# Patient Record
Sex: Female | Born: 1955 | Race: White | Hispanic: No | Marital: Single | Smoking: Former smoker
Health system: Southern US, Community
[De-identification: ages and names within clinical notes are randomized; demographics above are authoritative.]

## PROBLEM LIST (undated history)

## (undated) DIAGNOSIS — F329 Major depressive disorder, single episode, unspecified: Secondary | ICD-10-CM

## (undated) DIAGNOSIS — K219 Gastro-esophageal reflux disease without esophagitis: Secondary | ICD-10-CM

## (undated) DIAGNOSIS — K635 Polyp of colon: Secondary | ICD-10-CM

## (undated) DIAGNOSIS — K602 Anal fissure, unspecified: Secondary | ICD-10-CM

## (undated) DIAGNOSIS — E569 Vitamin deficiency, unspecified: Secondary | ICD-10-CM

## (undated) DIAGNOSIS — F32A Depression, unspecified: Secondary | ICD-10-CM

## (undated) DIAGNOSIS — I10 Essential (primary) hypertension: Secondary | ICD-10-CM

## (undated) DIAGNOSIS — J45909 Unspecified asthma, uncomplicated: Secondary | ICD-10-CM

## (undated) DIAGNOSIS — F419 Anxiety disorder, unspecified: Secondary | ICD-10-CM

## (undated) DIAGNOSIS — K589 Irritable bowel syndrome without diarrhea: Secondary | ICD-10-CM

## (undated) DIAGNOSIS — T7840XA Allergy, unspecified, initial encounter: Secondary | ICD-10-CM

## (undated) DIAGNOSIS — M1990 Unspecified osteoarthritis, unspecified site: Secondary | ICD-10-CM

## (undated) HISTORY — DX: Irritable bowel syndrome, unspecified: K58.9

## (undated) HISTORY — DX: Anxiety disorder, unspecified: F41.9

## (undated) HISTORY — DX: Depression, unspecified: F32.A

## (undated) HISTORY — DX: Unspecified asthma, uncomplicated: J45.909

## (undated) HISTORY — PX: TONSILLECTOMY: SUR1361

## (undated) HISTORY — DX: Allergy, unspecified, initial encounter: T78.40XA

## (undated) HISTORY — DX: Polyp of colon: K63.5

## (undated) HISTORY — DX: Vitamin deficiency, unspecified: E56.9

## (undated) HISTORY — DX: Gastro-esophageal reflux disease without esophagitis: K21.9

## (undated) HISTORY — DX: Anal fissure, unspecified: K60.2

---
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
## 1999-03-21 ENCOUNTER — Other Ambulatory Visit: Admission: RE | Admit: 1999-03-21 | Discharge: 1999-03-21 | Payer: Self-pay | Admitting: *Deleted

## 1999-03-28 ENCOUNTER — Other Ambulatory Visit: Admission: RE | Admit: 1999-03-28 | Discharge: 1999-03-28 | Payer: Self-pay | Admitting: Obstetrics and Gynecology

## 1999-03-28 ENCOUNTER — Encounter (INDEPENDENT_AMBULATORY_CARE_PROVIDER_SITE_OTHER): Payer: Self-pay | Admitting: Specialist

## 2000-09-09 ENCOUNTER — Ambulatory Visit (HOSPITAL_COMMUNITY): Admission: RE | Admit: 2000-09-09 | Discharge: 2000-09-09 | Payer: Self-pay | Admitting: Family Medicine

## 2000-09-09 ENCOUNTER — Encounter: Payer: Self-pay | Admitting: Family Medicine

## 2001-04-06 ENCOUNTER — Other Ambulatory Visit: Admission: RE | Admit: 2001-04-06 | Discharge: 2001-04-06 | Payer: Self-pay | Admitting: Obstetrics and Gynecology

## 2001-08-18 ENCOUNTER — Encounter: Payer: Self-pay | Admitting: Obstetrics and Gynecology

## 2001-08-18 ENCOUNTER — Ambulatory Visit (HOSPITAL_COMMUNITY): Admission: RE | Admit: 2001-08-18 | Discharge: 2001-08-18 | Payer: Self-pay | Admitting: Obstetrics and Gynecology

## 2002-05-16 ENCOUNTER — Other Ambulatory Visit: Admission: RE | Admit: 2002-05-16 | Discharge: 2002-05-16 | Payer: Self-pay | Admitting: Obstetrics and Gynecology

## 2002-10-18 ENCOUNTER — Encounter: Payer: Self-pay | Admitting: Gastroenterology

## 2002-10-18 ENCOUNTER — Encounter: Payer: Self-pay | Admitting: Family Medicine

## 2002-10-18 ENCOUNTER — Encounter: Admission: RE | Admit: 2002-10-18 | Discharge: 2002-10-18 | Payer: Self-pay | Admitting: Gastroenterology

## 2002-10-18 ENCOUNTER — Ambulatory Visit (HOSPITAL_COMMUNITY): Admission: RE | Admit: 2002-10-18 | Discharge: 2002-10-18 | Payer: Self-pay | Admitting: Family Medicine

## 2002-11-16 ENCOUNTER — Ambulatory Visit (HOSPITAL_COMMUNITY): Admission: RE | Admit: 2002-11-16 | Discharge: 2002-11-16 | Payer: Self-pay | Admitting: Gastroenterology

## 2002-11-16 ENCOUNTER — Encounter (INDEPENDENT_AMBULATORY_CARE_PROVIDER_SITE_OTHER): Payer: Self-pay | Admitting: *Deleted

## 2006-02-03 ENCOUNTER — Ambulatory Visit (HOSPITAL_COMMUNITY): Admission: RE | Admit: 2006-02-03 | Discharge: 2006-02-03 | Payer: Self-pay | Admitting: Obstetrics and Gynecology

## 2009-07-17 ENCOUNTER — Ambulatory Visit (HOSPITAL_COMMUNITY): Admission: RE | Admit: 2009-07-17 | Discharge: 2009-07-17 | Payer: Self-pay | Admitting: Obstetrics and Gynecology

## 2010-06-24 ENCOUNTER — Other Ambulatory Visit (HOSPITAL_COMMUNITY): Payer: Self-pay | Admitting: Obstetrics and Gynecology

## 2010-07-18 ENCOUNTER — Other Ambulatory Visit (HOSPITAL_COMMUNITY): Payer: Self-pay | Admitting: Obstetrics and Gynecology

## 2010-07-18 DIAGNOSIS — Z1231 Encounter for screening mammogram for malignant neoplasm of breast: Secondary | ICD-10-CM

## 2010-07-23 ENCOUNTER — Ambulatory Visit (HOSPITAL_COMMUNITY)
Admission: RE | Admit: 2010-07-23 | Discharge: 2010-07-23 | Disposition: A | Discharge status: Home / Self Care | Payer: Self-pay | Source: Ambulatory Visit | Attending: Obstetrics and Gynecology | Admitting: Obstetrics and Gynecology

## 2010-07-23 DIAGNOSIS — Z1231 Encounter for screening mammogram for malignant neoplasm of breast: Secondary | ICD-10-CM | POA: Insufficient documentation

## 2010-07-26 NOTE — Op Note (Signed)
NAME:  Virginia Cabrera, Virginia Cabrera                        ACCOUNT NO.:  [id number]   MEDICAL RECORD NO.:  [id number]                   PATIENT TYPE:  AMB   LOCATION:  ENDO                                 FACILITY:  [facility]   PHYSICIAN:  Anselmo Rod, M.D.               DATE OF BIRTH:  Dec 17, 1955   DATE OF PROCEDURE:  11/16/2002  DATE OF DISCHARGE:                                 OPERATIVE REPORT   PROCEDURE:  Colonoscopy with snare polypectomy x  and cold biopsies x6.   ENDOSCOPIST:  Charna Elizabeth, M.D.   INSTRUMENT USED:  Olympus video colonoscope.   INDICATIONS FOR PROCEDURE:  Rectal bleeding with mucoid stools in a 55-year-  old white female with a family history of colonic polyps.  There is no known  family history of colon cancer.   PROCEDURE PERFORMED:  Informed consent was procured from the patient.  The  patient fasted for eight hours prior to the procedure and prepped with a  bottle of magnesium citrate and a gallon of GOLYTELY the night prior to the  procedure.   PREPROCEDURE PHYSICAL EXAMINATION:  VITAL SIGNS:  The patient had stable  vital signs.  NECK:  Supple.  CHEST:  Clear to auscultation.  HEART:  S1 and S2 regular.  ABDOMEN:  Soft with normal bowel sounds.   DESCRIPTION OF PROCEDURE:  The patient was placed in the left lateral  decubitus position, sedated with 100 mg of Demerol and 10 mg of Versed  intravenously.  Once the patient was adequately sedated and maintained on  low flow oxygen, continuous cardiac monitoring, the Olympus video  colonoscope was advanced from the rectum to the cecum where with difficulty  there was a large amount of residual stool in the colon.  Multiple washings  were tone.  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  Random biopsies were done to confirm the  diagnosis.  A small sessile polyp was snared from the mid right colon.  There was a large amount of residual stool in the cecum.   The terminal ileum  appeared normal and healthy without lesions.  Small lesions could have been  missed secondary to a relatively poor prep.   IMPRESSION:  1. 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  Biopsy results pending.  2. Sessile polyps snared from mid right colon.  3. Large amount of residual stool in the colon; small lesions could have     been missed.  4. Normal terminal ileum.    RECOMMENDATIONS:  1. Await pathology results.  2. Avoid all nonsteroidals including aspirin until further orders.  3. Outpatient followup in the next two weeks for further recommendations.  Anselmo Rod, M.D.    JNM/MEDQ  D:  11/16/2002  T:  11/17/2002  Job:  [id number]   cc:   Gabriel Earing, M.D.  [address]  [place]  [place] [address]  Fax: [phone]

## 2010-08-25 ENCOUNTER — Emergency Department (HOSPITAL_COMMUNITY): Payer: Self-pay

## 2010-08-25 ENCOUNTER — Emergency Department (HOSPITAL_COMMUNITY)
Admission: EM | Admit: 2010-08-25 | Discharge: 2010-08-25 | Disposition: A | Discharge status: Home / Self Care | Payer: Self-pay | Attending: Emergency Medicine | Admitting: Emergency Medicine

## 2010-08-25 DIAGNOSIS — M25579 Pain in unspecified ankle and joints of unspecified foot: Secondary | ICD-10-CM | POA: Insufficient documentation

## 2010-08-25 DIAGNOSIS — X500XXA Overexertion from strenuous movement or load, initial encounter: Secondary | ICD-10-CM | POA: Insufficient documentation

## 2010-08-25 DIAGNOSIS — M25473 Effusion, unspecified ankle: Secondary | ICD-10-CM | POA: Insufficient documentation

## 2010-08-25 DIAGNOSIS — M25476 Effusion, unspecified foot: Secondary | ICD-10-CM | POA: Insufficient documentation

## 2010-08-25 DIAGNOSIS — S82899A Other fracture of unspecified lower leg, initial encounter for closed fracture: Secondary | ICD-10-CM | POA: Insufficient documentation

## 2011-06-03 ENCOUNTER — Other Ambulatory Visit: Payer: Self-pay | Admitting: Obstetrics and Gynecology

## 2011-06-03 DIAGNOSIS — Z1231 Encounter for screening mammogram for malignant neoplasm of breast: Secondary | ICD-10-CM

## 2011-07-28 ENCOUNTER — Ambulatory Visit (HOSPITAL_COMMUNITY)
Admission: RE | Admit: 2011-07-28 | Discharge: 2011-07-28 | Disposition: A | Discharge status: Home / Self Care | Payer: Self-pay | Source: Ambulatory Visit | Attending: Obstetrics and Gynecology | Admitting: Obstetrics and Gynecology

## 2011-07-28 DIAGNOSIS — Z1231 Encounter for screening mammogram for malignant neoplasm of breast: Secondary | ICD-10-CM | POA: Insufficient documentation

## 2011-12-24 ENCOUNTER — Ambulatory Visit: Payer: Self-pay | Admitting: Family Medicine

## 2011-12-24 VITALS — BP 146/84 | HR 88 | Temp 98.2°F | Resp 16 | Ht 62.0 in | Wt 219.0 lb

## 2011-12-24 DIAGNOSIS — Q383 Other congenital malformations of tongue: Secondary | ICD-10-CM

## 2011-12-24 NOTE — Patient Instructions (Signed)
.   Nice to meet you. I think this time growth is benign but I would like you to see a your nose and throat doctor. He will be getting this appointment set up with you in the near future. If you any questions please do not hesitate to call.

## 2011-12-24 NOTE — Progress Notes (Signed)
  Subjective:    Patient ID: Virginia Cabrera, female    DOB: December 03, 1955, 56 y.o.   MRN: [id number]  HPI 56 year old female coming in with a tongue mass. Patient states that she bit her tongue approximately 3 weeks ago and has had a significant growth since that time. Patient got first it was of no concern but now it continues to grow. Patient denies any pain denies any bleeding she denies also any fever, chills, or any abnormal weight loss. Patient was a smoker but did quit 13 years ago and does not do any other tobacco products. Patient is still able to eat without any trouble but it just feels funny. Patient denies any change in color and states that that growth appears the same color as her tongue.   Review of Systems As stated above in history of present illness  Past medical history is insignificant No past surgical history on file. History   Social History  . Marital Status: Single    Spouse Name: N/A    Number of Children: N/A  . Years of Education: N/A   Occupational History  . Groomer    Social History Main Topics  . Smoking status: Former Games developer  . Smokeless tobacco: Not on file  . Alcohol Use: Not on file  . Drug Use: Not on file  . Sexually Active: Not on file   Other Topics Concern  . Not on file   Social History Narrative  . No narrative on file   No family history on file.     Objective:   Physical Exam Blood pressure 146/84, pulse 88, temperature 98.2 F (36.8 C), resp. rate 16, height 5\' 2"  (1.575 m), weight 219 lb (99.338 kg). General: No apparent distress alert and oriented x3 moderately obese female Respiratory: Clear to auscultation bilaterally Cardiovascular: Regular rate and rhythm no murmur appreciated HEENT: Patient has moist mucous membranes. On patient's tongue this appears to be hypertrophic changes of the face but on the anterior third just to the right of the midline. This does not appear to have any bleeding associated with it or any signs  of the laceration. This does have well demarcated borders. Nontender on exam unable to be remove easily. Rest oral exam is unremarkable.       Assessment & Plan:

## 2011-12-24 NOTE — Assessment & Plan Note (Signed)
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 This seems to be growing no per patient's history. Patient is a former smoker 13 years ago. At this point I do want to refer patient to ear nose and throat for evaluation and possible biopsy. Discussed red flags and when to seek medical attention with patient as well as her significant other. Patient will followup here as needed.

## 2012-02-23 ENCOUNTER — Other Ambulatory Visit: Payer: Self-pay | Admitting: Otolaryngology

## 2012-09-29 ENCOUNTER — Emergency Department (HOSPITAL_COMMUNITY): Payer: No Typology Code available for payment source

## 2012-09-29 ENCOUNTER — Emergency Department (HOSPITAL_COMMUNITY)
Admission: EM | Admit: 2012-09-29 | Discharge: 2012-09-30 | Disposition: A | Discharge status: Home / Self Care | Payer: No Typology Code available for payment source | Attending: Emergency Medicine | Admitting: Emergency Medicine

## 2012-09-29 ENCOUNTER — Encounter (HOSPITAL_COMMUNITY): Payer: Self-pay | Admitting: Emergency Medicine

## 2012-09-29 DIAGNOSIS — M129 Arthropathy, unspecified: Secondary | ICD-10-CM | POA: Insufficient documentation

## 2012-09-29 DIAGNOSIS — X500XXA Overexertion from strenuous movement or load, initial encounter: Secondary | ICD-10-CM | POA: Insufficient documentation

## 2012-09-29 DIAGNOSIS — S8990XA Unspecified injury of unspecified lower leg, initial encounter: Secondary | ICD-10-CM | POA: Insufficient documentation

## 2012-09-29 DIAGNOSIS — Z79899 Other long term (current) drug therapy: Secondary | ICD-10-CM | POA: Insufficient documentation

## 2012-09-29 DIAGNOSIS — Y9389 Activity, other specified: Secondary | ICD-10-CM | POA: Insufficient documentation

## 2012-09-29 DIAGNOSIS — Y9289 Other specified places as the place of occurrence of the external cause: Secondary | ICD-10-CM | POA: Insufficient documentation

## 2012-09-29 DIAGNOSIS — S8991XA Unspecified injury of right lower leg, initial encounter: Secondary | ICD-10-CM

## 2012-09-29 HISTORY — DX: Unspecified osteoarthritis, unspecified site: M19.90

## 2012-09-29 MED ORDER — ONDANSETRON 4 MG PO TBDP
4.0000 mg | ORAL_TABLET | Freq: Once | ORAL | Status: AC
  Administered 2012-09-29: 4 mg via ORAL
  Filled 2012-09-29: qty 1

## 2012-09-29 MED ORDER — IBUPROFEN 800 MG PO TABS: 800.0000 mg | ORAL_TABLET | Freq: Three times a day (TID) | ORAL | Status: DC

## 2012-09-29 MED ORDER — OXYCODONE-ACETAMINOPHEN 5-325 MG PO TABS: 1.0000 | ORAL_TABLET | Freq: Four times a day (QID) | ORAL | Status: DC | PRN

## 2012-09-29 MED ORDER — OXYCODONE-ACETAMINOPHEN 5-325 MG PO TABS
2.0000 | ORAL_TABLET | Freq: Once | ORAL | Status: AC
  Administered 2012-09-29: 2 via ORAL
  Filled 2012-09-29: qty 2

## 2012-09-29 NOTE — ED Provider Notes (Signed)
History    This chart was scribed for a non-physician practitioner working with Virginia Cooper III, MD by Jiles Prows, ED scribe. This patient was seen in room TR11C/TR11C and the patient's care was started at 11:10 PM.  CSN: [id number] Arrival date & time 09/29/12  2140   Chief Complaint  Patient presents with  . Knee Pain   The history is provided by the patient and medical records. No language interpreter was used.   HPI Comments: Virginia Cabrera is a 57 y.o. female with a h/o arthritis who presents to the Emergency Department complaining of moderate, constant pain to right knee onset a couple weeks ago.  Pt reports that she stepped off the stairs and felt something pop in her right knee today like something tore in the back.  Pt notes swelling to area.  Pt denies headache, diaphoresis, fever, chills, nausea, vomiting, diarrhea, weakness, cough, SOB and any other pain.  Pt reports extending ankle exacerbates pain behind knee.  Pt states that she is limping and cannot put weight on her right leg.    Past Medical History  Diagnosis Date  . Arthritis    History reviewed. No pertinent past surgical history. No family history on file. History  Substance Use Topics  . Smoking status: Former Games developer  . Smokeless tobacco: Not on file  . Alcohol Use: No   OB History   Grav Para Term Preterm Abortions TAB SAB Ect Mult Living                 Review of Systems  All other systems reviewed and are negative.    Allergies  Morphine and related  Home Medications   Current Outpatient Rx  Name  Route  Sig  Dispense  Refill  . meloxicam (MOBIC) 7.5 MG tablet   Oral   Take 7.5 mg by mouth daily.         Marland Kitchen ibuprofen (ADVIL,MOTRIN) 800 MG tablet   Oral   Take 1 tablet (800 mg total) by mouth 3 (three) times daily.   21 tablet   0   . oxyCODONE-acetaminophen (PERCOCET/ROXICET) 5-325 MG per tablet   Oral   Take 1-2 tablets by mouth every 6 (six) hours as needed for pain.   20  tablet   0    BP 120/104  Pulse 87  Temp(Src) 97.1 F (36.2 C) (Oral)  Resp 18  SpO2 98% Physical Exam  Nursing note and vitals reviewed. Constitutional: She is oriented to person, place, and time. She appears well-developed and well-nourished. No distress.  HENT:  Head: Normocephalic and atraumatic.  Eyes: EOM are normal.  Neck: Neck supple. No tracheal deviation present.  Cardiovascular: Normal rate.   Pulmonary/Chest: Effort normal. No respiratory distress.  Musculoskeletal:       Right knee: She exhibits decreased range of motion, swelling and effusion. She exhibits no ecchymosis, no deformity, no laceration, no erythema, normal alignment, no LCL laxity, normal patellar mobility and no bony tenderness. Tenderness found. No patellar tendon tenderness noted.  Neurological: She is alert and oriented to person, place, and time.  Skin: Skin is warm and dry.  Psychiatric: She has a normal mood and affect. Her behavior is normal.    ED Course  Procedures (including critical care time) DIAGNOSTIC STUDIES: Filed Vitals:   09/29/12 2146  BP: 120/104  Pulse: 87  Temp: 97.1 F (36.2 C)  TempSrc: Oral  Resp: 18  SpO2: 98%   COORDINATION OF CARE: 11:17 PM - Discussed  ED treatment with pt at bedside including crutches, pain management, ice packs, x-ray, and knee immobilizer and pt agrees.  Advised to follow up with orthopedic in her home city.  Will write note for light duty and no work for a week.    Labs Reviewed - No data to display Dg Tibia/fibula Right  09/29/2012   *RADIOLOGY REPORT*  Clinical Data: Right knee pain extending to the tibia and fibula.  RIGHT TIBIA AND FIBULA - 2 VIEW  Comparison: None.  Findings: There is no acute fracture or dislocation.  There are accessory ossicles versus old post-traumatic change of the distal medial malleolus.  There is plantar calcaneal spur.  Calcification at the Achilles tendon insertion to the calcaneus is noted.  IMPRESSION: No acute  fracture or dislocation.   Original Report Authenticated By: Sherian Rein, M.D.   Dg Knee Complete 4 Views Right  09/29/2012   *RADIOLOGY REPORT*  Clinical Data: Right knee pain after injury.  RIGHT KNEE - COMPLETE 4+ VIEW  Comparison: None.  Findings: The right knee appears intact. No evidence of acute fracture or subluxation.  No focal bone lesions.  Bone matrix and cortex appear intact.  No abnormal radiopaque densities in the soft tissues.  No significant effusion.  IMPRESSION: No acute bony abnormalities demonstrated in the right knee.   Original Report Authenticated By: Burman Nieves, M.D.   1. Knee injury, right, initial encounter     MDM   57 y.o.Virginia Cabrera's evaluation in the Emergency Department is complete. It has been determined that no acute conditions requiring further emergency intervention are present at this time. The patient/guardian have been advised of the diagnosis and plan. We have discussed signs and symptoms that warrant return to the ED, such as changes or worsening in symptoms.  Vital signs are stable at discharge. Filed Vitals:   09/29/12 2146  BP: 120/104  Pulse: 87  Temp: 97.1 F (36.2 C)  Resp: 18    Patient/guardian has voiced understanding and agreed to follow-up with the PCP or specialist.  I personally performed the services described in this documentation, which was scribed in my presence. The recorded information has been reviewed and is accurate.   Dorthula Matas, PA-C 09/29/12 2326

## 2012-09-29 NOTE — ED Notes (Signed)
PT. REPORTS RIGHT KNEE PAIN ONSET THIS EVENING DENIES INJURY OR FALL . PT. STATED HISTORY OF ARTHRITIS.

## 2012-09-30 NOTE — ED Provider Notes (Signed)
Medical screening examination/treatment/procedure(s) were performed by non-physician practitioner and as supervising physician I was immediately available for consultation/collaboration.   Carleene Cooper III, MD 09/30/12 [phone]

## 2018-04-02 ENCOUNTER — Other Ambulatory Visit (HOSPITAL_COMMUNITY): Payer: Self-pay | Admitting: *Deleted

## 2018-04-02 DIAGNOSIS — Z1231 Encounter for screening mammogram for malignant neoplasm of breast: Secondary | ICD-10-CM

## 2018-04-07 ENCOUNTER — Encounter: Payer: Self-pay | Admitting: Family Medicine

## 2018-04-07 ENCOUNTER — Ambulatory Visit: Payer: Self-pay | Admitting: Family Medicine

## 2018-04-07 VITALS — BP 148/73 | HR 87 | Ht 63.0 in | Wt 223.0 lb

## 2018-04-07 DIAGNOSIS — S46011A Strain of muscle(s) and tendon(s) of the rotator cuff of right shoulder, initial encounter: Secondary | ICD-10-CM

## 2018-04-07 NOTE — Patient Instructions (Addendum)
Thank you for coming in today.  You should hear from [facility] soon.   I will send letters.  Let me know if you have questions or need anything.   Work on range of motion exercises.    Rotator Cuff Tear Rehab After Surgery Ask your health care provider which exercises are safe for you. Do exercises exactly as told by your health care provider and adjust them as directed. It is normal to feel mild stretching, pulling, tightness, or discomfort as you do these exercises, but you should stop right away if you feel sudden pain or your pain gets worse. Do not begin these exercises until told by your health care provider. Stretching and range of motion exercises These exercises warm up your muscles and joints and improve the movement and flexibility of your shoulder. These exercises also help to relieve pain, numbness, and tingling. Exercise A: Pendulum  1. Stand near a wall or a surface that you can hold onto for balance. 2. Bend at the waist and let your left / right arm hang straight down. Use your other arm to keep your balance. 3. Relax your arm and shoulder muscles, and move your hips and your trunk so your left / right arm swings freely. Your arm should swing because of the motion of your body, not because you are using your arm or shoulder muscles. 4. Keep moving so your arm swings in the following directions, as told by your health care provider: ? Side to side. ? Forward and backward. ? In clockwise and counterclockwise circles. Repeat __________ times, or for __________ seconds per direction. Complete this exercise __________ times a day. Exercise B: Flexion, seated  1. Sit in a stable chair so your left / right forearm can rest on a flat surface. Your elbow should rest at a height that keeps your upper arm next to your body. 2. Keeping your shoulder relaxed, lean forward at the waist and let your hand slide forward. Stop when you feel a stretch in your shoulder, or when  you reach the angle that is recommended by your health care provider. 3. Hold for __________ seconds. 4. Slowly return to the starting position. Repeat __________ times. Complete this exercise __________ times a day. Exercise C: Flexion, standing  1. Stand and hold a broomstick, a cane, or a similar object. Place your hands a little more than shoulder-width apart on the object. Your left / right hand should be palm-up, and your other hand should be palm-down. 2. Push the stick down with your healthy arm to raise your left / right arm in front of your body, and then over your head. Use your other hand to help move the stick. Stop when you feel a stretch in your shoulder, or when you reach the angle that is recommended by your health care provider. ? Avoid shrugging your shoulder while you raise your arm. Keep your shoulder blade tucked down toward your spine. ? Keep your left / right shoulder muscles relaxed. 3. Hold for __________ seconds. 4. Slowly return to the starting position. Repeat __________ times. Complete this exercise __________ times a day. Exercise D: Abduction, supine  1. Lie on your back and hold a broomstick, a cane, or a similar object. Place your hands a little more than shoulder-width apart on the object. Your left / right hand should be palm-up, and your other hand should be palm-down. 2. Push the stick to raise your left / right arm out to your side and  then over your head. Use your other hand to help move the stick. Stop when you feel a stretch in your shoulder, or when you reach the angle that is recommended by your health care provider. ? Avoid shrugging your shoulder while you raise your arm. Keep your shoulder blade tucked down toward your spine. 3. Hold for __________ seconds. 4. Slowly return to the starting position. Repeat __________ times. Complete this exercise __________ times a day. Exercise E: Shoulder flexion, active-assisted  1. Lie on your back. You may  bend your knees for comfort. 2. Hold a broomstick, a cane, or a similar object so your hands are about shoulder-width apart. Your palms should face toward your feet. 3. Raise your left / right arm over your head and behind your head, toward the floor. Use your other hand to help you do this. Stop when you feel a gentle stretch in your shoulder, or when you reach the angle that is recommended by your health care provider. 4. Hold for __________ seconds. 5. Use the broomstick and your other arm to help you return your left / right arm to the starting position. Repeat __________ times. Complete this exercise __________ times a day. Exercise F: External rotation  1. Sit in a stable chair without armrests, or stand. 2. Tuck a soft object, such as a folded towel or a small ball, under your left / right upper arm. 3. Hold a broomstick, a cane, or a similar object so your palms face down, toward the floor. Bend your elbows to an "L" shape (90 degrees), and keep your hands about shoulder-width apart. 4. Straighten your healthy arm and push the broomstick across your body, toward your left / right side. Keep your left / right arm bent. This will rotate your left / right forearm away from your body. 5. Hold for __________ seconds. 6. Slowly return to the starting position. Repeat __________ times. Complete this exercise __________ times a day. Strengthening exercises These exercises build strength and endurance in your shoulder. Endurance is the ability to use your muscles for a long time, even after they get tired. Exercise G: Shoulder flexion, isometric  1. Stand or sit about 4-6 inches (10-15 cm) away from a wall with your left / right side facing the wall. 2. Gently make a fist and place your left / right hand on the wall so the top of your fist touches the wall. 3. With your left / right elbow straight, gently press the top of your fist into the wall. Gradually increase the pressure until you are  pressing as hard as you can without shrugging your shoulder. 4. Hold for __________ seconds. 5. Slowly release the tension and relax your muscles completely before you repeat the exercise. Repeat __________ times. Complete this exercise __________ times a day. Exercise H: Shoulder abduction, isometric  1. Stand or sit about 4-6 inches (10-15 cm) away from a wall with your right/left side facing the wall. 2. Bend your left / right elbow and gently press your elbow into the wall as if you are trying to move your arm out to your side. Increase the pressure gradually until you are pressing as hard as you can without shrugging your shoulder. 3. Hold for __________ seconds. 4. Slowly release the tension and relax your muscles completely before repeating the exercise. Repeat __________ times. Complete this exercise __________ times a day. Exercise I: Internal rotation, isometric  1. Stand or sit in a doorway, facing the door frame. 2. AnthostonBend  your left / right elbow and place the palm of your hand against the door frame. Only your palm should be touching the frame. Keep your upper arm at your side. 3. Gently press your hand into the door frame, as if you are trying to push your arm toward your abdomen. Do not let your wrist bend. ? Avoid shrugging your shoulder while you press your hand into the door frame. Keep your shoulder blade tucked down toward the middle of your back. 4. Hold for __________ seconds. 5. Slowly release the tension, and relax your muscles completely before you repeat the exercise. Repeat __________ times. Complete this exercise __________ times a day. Exercise J: External rotation, isometric  1. Stand or sit in a doorway, facing the door frame. 2. Bend your left / right elbow and place the back of your wrist against the door frame. Only the back of your wrist should be touching the frame. Keep your upper arm at your side. 3. Gently press your wrist against the door frame, as if you  are trying to push your arm away from your abdomen. ? Avoid shrugging your shoulder while you press your wrist into the door frame. Keep your shoulder blade tucked down toward the middle of your back. 4. Hold for __________ seconds. 5. Slowly release the tension, and relax your muscles completely before you repeat the exercise. Repeat __________ times. Complete this exercise __________ times a day. This information is not intended to replace advice given to you by your health care provider. Make sure you discuss any questions you have with your health care provider. Document Released: 02/24/2005 Document Revised: 11/01/2015 Document Reviewed: 03/10/2015 Elsevier Interactive Patient Education  2019 [employer].

## 2018-04-07 NOTE — Progress Notes (Signed)
Subjective:    CC: Right shoulder rotator cuff tear.  HPI: Virginia Cabrera fell at work July 29.  She had initial evaluation and treatment with [facility] [place].  She had trial of conservative management with physical therapy for 6 weeks.  She failed to improve sufficiently and she had an MRI obtained on October 10.  MRI showed rotator cuff tear of the supraspinatus with partial retraction.  Additionally it showed supraspinatus and infraspinatus tendinitis and subdeltoid calcific bursitis.  However Worker's Compensation did not approve surgery and she has been in somewhat limbo since.  She notes persistent pain in the right lateral upper arm and shoulder.  She has pain with overhead motion and reaching back.  She also has some weakness to overhead motion.  She no longer is able to work and has difficulty even with tasks at home such as lifting an object above her head to put in a shelf.  She notes pain at bedtime that interferes with sleep as well.  She is been trying over-the-counter medications for pain which help a little.  She denies any radiating pain weakness or numbness distally beyond her elbow.  She is hopeful that she can proceed with surgery at some point in the near future.  She currently is working with an Pensions consultantattorney at [facility] in [place] ([facility]).  Past medical history, Surgical history, Family history not pertinant except as noted below, Social history, Allergies, and medications have been entered into the medical record, reviewed, and no changes needed.   Review of Systems: No headache, visual changes, nausea, vomiting, diarrhea, constipation, dizziness, abdominal pain, skin rash, fevers, chills, night sweats, weight loss, swollen lymph nodes, body aches, joint swelling, muscle aches, chest pain, shortness of breath, mood changes, visual or auditory hallucinations.   Objective:    Vitals:   04/07/18 1336  BP: (!) 148/73  Pulse: 87   General: Well  Developed, well nourished, and in no acute distress.  Neuro/Psych: Alert and oriented x3, extra-ocular muscles intact, able to move all 4 extremities, sensation grossly intact. Skin: Warm and dry, no rashes noted.  Respiratory: Not using accessory muscles, speaking in full sentences, trachea midline.  Cardiovascular: Pulses palpable, no extremity edema. Abdomen: Does not appear distended. MSK:  C-spine: Nontender to spinal midline normal cervical motion. Right shoulder: Normal-appearing Mildly tender palpation AC joint. Range of motion: Abduction limited to 120 degrees with significant scapular protraction. External rotation full. Internal rotation limited to lumbar spine. Strength diminished 3+/5 abduction, 4/5 external rotation, and 5/5 internal rotation. Positive Hawkins and Neer's test. Positive empty can test. Negative Yergason's and speeds test.  Left shoulder: Normal-appearing nontender normal motion and strength.  Negative impingement testing.  Pulses capillary fill and sensation are intact distal bilateral upper extremities.  Lab and Radiology Results Acute Interface, Incoming Rad Results - 12/17/2017 11:06 AM EDT HISTORY:  Sprain of right rotator cuff capsule, subsequent encounter  TECHNIQUE:  MRI of the right shoulder without contrast.  COMPARISON: None.  FINDINGS: Bones: No acute fracture or osteonecrosis. Acromioclavicular joint: Hypertrophic osteoarthritis with capsular edema. Low-grade marrow edema in the distal clavicle and acromion. Subacromial/Subdeltoid bursa:  Small amount of fluid in the bursa. Small foci of signal void measuring up to 9 mm in the lateral subdeltoid bursa, consistent with calcification. Glenohumeral joint: No joint effusion Biceps tendon: Intact and in anatomic position. Labroligamentous complex: Limited evaluation of the labrum without intra-articular contrast.  No discrete tear visible. Rotator cuff:  There is a moderate-high grade  articular sided partial tear of the anterior supraspinatus tendon, approximately 50% or slightly more of the tendon thickness. There is retraction of articular sided fibers on the order of 1.4 cm. Mild  supraspinatus and anterior infraspinatus tendinopathy. Other: None.   IMPRESSION:  1. Moderate-high-grade articular sided partial tear of the anterior supraspinatus tendon, with mild retraction of articular sided fibers. 2. Mild supraspinatus and infraspinatus tendinopathy. 3. Lateral, subdeltoid calcific bursitis. 4. Hypertrophic acromioclavicular joint degenerative changes with capsulitis and reactive marrow edema in the distal clavicle/acromion.  Electronically Signed by: Barrett Henle  I personally (independently) visualized and performed the interpretation of the images attached in this note via CD provided by patient.   Impression and Recommendations:    Assessment and Plan: 63 y.o. female with  Right shoulder pain due to supraspinatus rotator cuff tear with partial retraction.  Patient certainly has had plenty of time to improve and an adequate trial of conservative management..  I am not optimistic about further conservative measures being very helpful.  I think the best course of action would be to proceed with surgery.  Unfortunately she has a medical legal barrier to surgery.  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  She is currently in the process of working with her attorney to get this matter resolved.  In the meantime on my medical and will refer to Dr. Francena Hanly at [facility] orth[facility]ics.  This is part of [facility] practice in the [place] location.  I think at this point surgery is the best option.  She can also continue to work on range of motion exercises to keep her shoulder moving.  Continue oral medications as tolerated.  Doubtful that injection will be very helpful at this point.  CC: [facility]  [facility] [employer] [facility] [address] [place] [address] Phone [phone] Fax: [phone]    Orders Placed This Encounter  Procedures  . Ambulatory referral to Orthopedic Surgery    Referral Priority:   Routine    Referral Type:   Surgical    Referral Reason:   Specialty Services Required    Referred to Provider:   Francena Hanly, MD    Requested Specialty:   Orthopedic Surgery    Number of Visits Requested:   1   No orders of the defined types were placed in this encounter.   Discussed warning signs or symptoms. Please see discharge instructions. Patient expresses understanding.

## 2018-04-08 ENCOUNTER — Encounter: Payer: Self-pay | Admitting: Family Medicine

## 2018-04-08 DIAGNOSIS — S46011A Strain of muscle(s) and tendon(s) of the rotator cuff of right shoulder, initial encounter: Secondary | ICD-10-CM | POA: Insufficient documentation

## 2018-05-05 ENCOUNTER — Telehealth: Payer: Self-pay | Admitting: Family Medicine

## 2018-05-05 NOTE — Telephone Encounter (Signed)
Received note from Dr. Rennis Chris at [facility] orthopedics.  He agrees and would plan to proceed with arthroscopic surgery for her shoulder pain.  Awaiting the clearance from insurance or Worker's Comp.

## 2018-06-02 ENCOUNTER — Telehealth (HOSPITAL_COMMUNITY): Payer: Self-pay

## 2018-06-02 NOTE — Telephone Encounter (Signed)
Left message with patient about [facility] appointment that will need to be rescheduled due to CO-[id number]. Left name and number for patient to call back. °

## 2018-06-17 ENCOUNTER — Ambulatory Visit: Admit: 2018-06-17 | Payer: Medicaid Other | Admitting: Orthopedic Surgery

## 2018-06-17 SURGERY — ARTHROSCOPY, SHOULDER, WITH ROTATOR CUFF REPAIR
Anesthesia: General | Laterality: Right

## 2018-06-29 ENCOUNTER — Ambulatory Visit (HOSPITAL_COMMUNITY): Payer: No Typology Code available for payment source

## 2018-08-24 ENCOUNTER — Other Ambulatory Visit: Payer: Self-pay

## 2018-08-24 ENCOUNTER — Ambulatory Visit (INDEPENDENT_AMBULATORY_CARE_PROVIDER_SITE_OTHER): Payer: Medicaid Other | Admitting: Registered Nurse

## 2018-08-24 ENCOUNTER — Telehealth: Payer: Self-pay | Admitting: Registered Nurse

## 2018-08-24 ENCOUNTER — Encounter: Payer: Self-pay | Admitting: Registered Nurse

## 2018-08-24 VITALS — BP 158/88 | HR 94 | Temp 98.2°F | Resp 18 | Wt 232.0 lb

## 2018-08-24 DIAGNOSIS — Z13228 Encounter for screening for other metabolic disorders: Secondary | ICD-10-CM

## 2018-08-24 DIAGNOSIS — H6123 Impacted cerumen, bilateral: Secondary | ICD-10-CM

## 2018-08-24 DIAGNOSIS — Z130 Encounter for screening for diseases of the blood and blood-forming organs and certain disorders involving the immune mechanism: Secondary | ICD-10-CM

## 2018-08-24 DIAGNOSIS — Z7689 Persons encountering health services in other specified circumstances: Secondary | ICD-10-CM

## 2018-08-24 DIAGNOSIS — Z1329 Encounter for screening for other suspected endocrine disorder: Secondary | ICD-10-CM

## 2018-08-24 DIAGNOSIS — Z13220 Encounter for screening for lipoid disorders: Secondary | ICD-10-CM

## 2018-08-24 NOTE — Telephone Encounter (Signed)
08/24/2018 - PATIENT HAD A NEW PATIENT OFFICE VISIT WITH RICH MORROW ON Tuesday (08/24/2018). RICH REQUESTED SHE RETURN IN 2 WEEKS FOR A FOLLOW-UP VISIT WITH HIM. I NEVER SAW HER COME TO CHECK-OUT. I TRIED TO CALL AND SCHEDULE BUT HAD TO LEAVE HER A VOICE MAIL TO RETURN OUR CALL. [place]

## 2018-08-24 NOTE — Patient Instructions (Signed)
° ° ° °  If you have lab work done today you will be contacted with your lab results within the next 2 weeks.  If you have not heard from us then please contact us. The fastest way to get your results is to register for My Chart. ° ° °IF you received an x-ray today, you will receive an invoice from [facility]. Please contact [facility] at [phone] with questions or concerns regarding your invoice.  ° °IF you received labwork today, you will receive an invoice from [facility]. Please contact [facility] at [phone] with questions or concerns regarding your invoice.  ° °Our billing staff will not be able to assist you with questions regarding bills from these companies. ° °You will be contacted with the lab results as soon as they are available. The fastest way to get your results is to activate your My Chart account. Instructions are located on the last page of this paperwork. If you have not heard from us regarding the results in 2 weeks, please contact this office. °  ° ° ° °

## 2018-08-24 NOTE — Progress Notes (Signed)
 Established Patient Office Visit  Subjective:  Patient ID: Virginia Cabrera, female    DOB: 06/14/1955  Age: 62 y.o. MRN: [id number]  CC:  Chief Complaint  Patient presents with  . Ear Pain    pt states both ears are clogged and she cant hear x few months     HPI Virginia Cabrera presents for visit to establish care and diminished hearing.  She has been dealing with ongoing legal struggles in a workman's comp case d/t a fall she suffered last year - this resulted in knee and shoulder injuries, both which require surgery that had been scheduled but now delayed d/t COVID. She subsequently lost her job and has spent her retirement savings to stay afloat. She's been very stressed because of this and gained around 45 lbs in the past year. Her BP has risen as a result, and we will need her to return to clinic in 2 weeks for a BP recheck.  She's been experiencing diminished hearing x 2 months. She reports that it was a slow onset. She denies pain. It is bilateral, though the R side is marginally better than the left.  Past Medical History:  Diagnosis Date  . Allergy   . Anxiety   . Arthritis   . Asthma   . Depression     Past Surgical History:  Procedure Laterality Date  . CESAREAN SECTION    . TONSILLECTOMY      Family History  Problem Relation Age of Onset  . Breast cancer Mother   . Brain cancer Father   . Breast cancer Maternal Aunt   . Stroke Maternal Aunt     Social History   Socioeconomic History  . Marital status: Single    Spouse name: Not on file  . Number of children: 1  . Years of education: Not on file  . Highest education level: Not on file  Occupational History  . Occupation: Groomer  Social Needs  . Financial resource strain: Not hard at all  . Food insecurity    Worry: Never true    Inability: Never true  . Transportation needs    Medical: No    Non-medical: No  Tobacco Use  . Smoking status: Former Smoker    Packs/day: 0.20    Years: 15.00     Pack years: 3.00    Types: Cigarettes    Start date: 08/24/1983    Quit date: 08/24/1998    Years since quitting: 20.0  . Smokeless tobacco: Never Used  Substance and Sexual Activity  . Alcohol use: No  . Drug use: No  . Sexual activity: Not on file  Lifestyle  . Physical activity    Days per week: 0 days    Minutes per session: 0 min  . Stress: Not at all  Relationships  . Social connections    Talks on phone: Three times a week    Gets together: Twice a week    Attends religious service: 1 to 4 times per year    Active member of club or organization: No    Attends meetings of clubs or organizations: Never    Relationship status: Never married  . Intimate partner violence    Fear of current or ex partner: No    Emotionally abused: No    Physically abused: No    Forced sexual activity: No  Other Topics Concern  . Not on file  Social History Narrative  . Not on file      Outpatient Medications Prior to Visit  Medication Sig Dispense Refill  . albuterol (VENTOLIN HFA) 108 (90 Base) MCG/ACT inhaler INHALE 1 PUFF BY MOUTH EVERY 4 TO 6 HOURS AS NEEDED FOR 30 DAYS    . escitalopram (LEXAPRO) 10 MG tablet TK 1 T PO HS    . hydrochlorothiazide (HYDRODIURIL) 12.5 MG tablet TAKE 1 TABLET BY MOUTH ONCE DAILY FOR 90 DAYS    . hydrochlorothiazide (MICROZIDE) 12.5 MG capsule Take 12.5 mg by mouth daily.    . hydrOXYzine (ATARAX/VISTARIL) 10 MG tablet TK 1 T PO BID PRN    . ibuprofen (ADVIL,MOTRIN) 800 MG tablet Take 1 tablet (800 mg total) by mouth 3 (three) times daily. 21 tablet 0  . meloxicam (MOBIC) 7.5 MG tablet Take 7.5 mg by mouth daily.    . traMADol (ULTRAM) 50 MG tablet TAKE 1 TABLET BY MOUTH EVERY DAY AT BEDTIME AS NEEDED FOR PAIN    . traZODone (DESYREL) 100 MG tablet TK 1 T PO HS PRN    . meloxicam (MOBIC) 15 MG tablet Take 15 mg by mouth daily.     No facility-administered medications prior to visit.     Allergies  Allergen Reactions  . Morphine And Related      ROS Review of Systems  Constitutional: Negative.   HENT: Positive for hearing loss. Negative for ear discharge and ear pain.   Eyes: Negative.   Respiratory: Negative.   Cardiovascular: Negative.  Negative for chest pain.  Gastrointestinal: Negative.   Endocrine: Negative.   Genitourinary: Negative.   Musculoskeletal: Negative.   Skin: Negative.   Allergic/Immunologic: Negative.   Neurological: Negative.   Hematological: Negative.   Psychiatric/Behavioral: Negative.       Objective:    Physical Exam  Constitutional: She is oriented to person, place, and time. She appears well-developed and well-nourished. No distress.  HENT:  Right Ear: Decreased hearing is noted.  Left Ear: Decreased hearing is noted.  Bilateral impacted cerumen.   Cardiovascular: Normal rate and regular rhythm.  Pulmonary/Chest: Effort normal. No respiratory distress.  Neurological: She is alert and oriented to person, place, and time.  Skin: Skin is warm and dry. No rash noted. She is not diaphoretic. No erythema. No pallor.  Psychiatric: She has a normal mood and affect. Her behavior is normal. Judgment and thought content normal.  Nursing note and vitals reviewed.   BP (!) 158/88   Pulse 94   Temp 98.2 F (36.8 C) (Oral)   Resp 18   Wt 232 lb (105.2 kg)   SpO2 100%   BMI 41.10 kg/m  Wt Readings from Last 3 Encounters:  08/24/18 232 lb (105.2 kg)  04/07/18 223 lb (101.2 kg)  12/24/11 219 lb (99.3 kg)     Health Maintenance Due  Topic Date Due  . Hepatitis C Screening  [birth year]  . HIV Screening  02/05/1973  . TETANUS/TDAP  02/05/1977  . PAP SMEAR-Modifier  02/06/1979  . COLONOSCOPY  02/06/2008  . MAMMOGRAM  07/27/2013    There are no preventive care reminders to display for this patient.  No results found for: TSH No results found for: WBC, HGB, HCT, MCV, PLT No results found for: NA, K, CHLORIDE, CO2, GLUCOSE, BUN, CREATININE, BILITOT, ALKPHOS, AST, ALT, PROT, ALBUMIN, CALCIUM,  ANIONGAP, EGFR, GFR No results found for: CHOL No results found for: HDL No results found for: LDLCALC No results found for: TRIG No results found for: CHOLHDL No results found for: HGBA1C    Assessment & Plan:  Problem List Items Addressed This Visit    None    Visit Diagnoses    Screening for endocrine, metabolic and immunity disorder    -  Primary   Relevant Orders   CBC   Comprehensive metabolic panel   Hemoglobin A1c   Lipid screening       Relevant Orders   Lipid panel      No orders of the defined types were placed in this encounter.   Follow-up: No follow-ups on file.   PLAN  CMA Dierdre Searles gave bilateral lavage to remove impacted cerumen with good effect. Visualized intact TMs following lavage and improvement in hearing.   Pt instructed to return to clinic for 2 week BP recheck. May add agent to control BP. Encouraged diet and exercise - but a large component of weight gain and BP has been ongoing pain from injuries. She is hoping to get surgery scheduled soon.  Patient encouraged to call clinic with any questions, comments, or concerns.   Maximiano Coss, NP

## 2018-08-25 LAB — COMPREHENSIVE METABOLIC PANEL
ALT: 45 IU/L — ABNORMAL HIGH (ref 0–32)
AST: 44 IU/L — ABNORMAL HIGH (ref 0–40)
Albumin/Globulin Ratio: 1.6 (ref 1.2–2.2)
Albumin: 4.7 g/dL (ref 3.8–4.9)
Alkaline Phosphatase: 65 IU/L (ref 39–117)
BUN/Creatinine Ratio: 16 (ref 12–28)
BUN: 18 mg/dL (ref 8–27)
Bilirubin Total: 0.2 mg/dL (ref 0.0–1.2)
CO2: 21 mmol/L (ref 20–29)
Calcium: 9.8 mg/dL (ref 8.7–10.3)
Chloride: 101 mmol/L (ref 96–106)
Creatinine, Ser: 1.11 mg/dL — ABNORMAL HIGH (ref 0.57–1.00)
GFR calc Af Amer: 62 mL/min/{1.73_m2} (ref >59)
GFR calc non Af Amer: 54 mL/min/{1.73_m2} — ABNORMAL LOW (ref >59)
Globulin, Total: 2.9 g/dL (ref 1.5–4.5)
Glucose: 98 mg/dL (ref 65–99)
Potassium: 4.1 mmol/L (ref 3.5–5.2)
Sodium: 140 mmol/L (ref 134–144)
Total Protein: 7.6 g/dL (ref 6.0–8.5)

## 2018-08-25 LAB — LIPID PANEL
Chol/HDL Ratio: 3.9 ratio (ref 0.0–4.4)
Cholesterol, Total: 220 mg/dL — ABNORMAL HIGH (ref 100–199)
HDL: 57 mg/dL (ref >39)
LDL Calculated: 122 mg/dL — ABNORMAL HIGH (ref 0–99)
Triglycerides: 203 mg/dL — ABNORMAL HIGH (ref 0–149)
VLDL Cholesterol Cal: 41 mg/dL — ABNORMAL HIGH (ref 5–40)

## 2018-08-25 LAB — CBC
Hematocrit: 41.1 % (ref 34.0–46.6)
Hemoglobin: 14.1 g/dL (ref 11.1–15.9)
MCH: 31.1 pg (ref 26.6–33.0)
MCHC: 34.3 g/dL (ref 31.5–35.7)
MCV: 91 fL (ref 79–97)
Platelets: 262 10*3/uL (ref 150–450)
RBC: 4.54 x10E6/uL (ref 3.77–5.28)
RDW: 12.2 % (ref 11.7–15.4)
WBC: 6.9 10*3/uL (ref 3.4–10.8)

## 2018-08-25 LAB — HEMOGLOBIN A1C
Est. average glucose Bld gHb Est-mCnc: 114 mg/dL
Hgb A1c MFr Bld: 5.6 % (ref 4.8–5.6)

## 2018-08-26 NOTE — Progress Notes (Signed)
Called and discussed with patient - elevated lipids, elevated Cr, decr GFR. These warrant further attention and follow up. Given patient's recent history of injury and ensuing weight gain, we're hopeful that lifestyle modifications can help address lipids. Additionally, we discussed limiting use of OTC NSAIDs to give kidneys a break. We will determine a plan for follow up after she presents in 09/06/18 for a BP recheck.  Kathrin Ruddy, NP

## 2018-09-06 ENCOUNTER — Ambulatory Visit: Payer: Medicaid Other | Admitting: Registered Nurse

## 2018-09-07 ENCOUNTER — Ambulatory Visit (INDEPENDENT_AMBULATORY_CARE_PROVIDER_SITE_OTHER): Payer: Self-pay | Admitting: Registered Nurse

## 2018-09-07 ENCOUNTER — Other Ambulatory Visit: Payer: Self-pay

## 2018-09-07 DIAGNOSIS — Z130 Encounter for screening for diseases of the blood and blood-forming organs and certain disorders involving the immune mechanism: Secondary | ICD-10-CM

## 2018-09-07 DIAGNOSIS — Z13220 Encounter for screening for lipoid disorders: Secondary | ICD-10-CM

## 2018-09-15 NOTE — Progress Notes (Signed)
Lab visit only. 

## 2018-09-16 ENCOUNTER — Ambulatory Visit: Admit: 2018-09-16 | Payer: Medicaid Other | Admitting: Orthopedic Surgery

## 2018-09-16 SURGERY — ARTHROSCOPY, SHOULDER, WITH ROTATOR CUFF REPAIR
Anesthesia: General | Laterality: Right

## 2018-10-12 ENCOUNTER — Ambulatory Visit
Admission: RE | Admit: 2018-10-12 | Discharge: 2018-10-12 | Disposition: A | Discharge status: Home / Self Care | Payer: No Typology Code available for payment source | Source: Ambulatory Visit | Attending: Obstetrics and Gynecology | Admitting: Obstetrics and Gynecology

## 2018-10-12 ENCOUNTER — Ambulatory Visit (HOSPITAL_COMMUNITY)
Admission: RE | Admit: 2018-10-12 | Discharge: 2018-10-12 | Disposition: A | Discharge status: Home / Self Care | Payer: Medicaid Other | Source: Ambulatory Visit | Attending: Obstetrics and Gynecology | Admitting: Obstetrics and Gynecology

## 2018-10-12 ENCOUNTER — Encounter (HOSPITAL_COMMUNITY): Payer: Self-pay

## 2018-10-12 ENCOUNTER — Other Ambulatory Visit: Payer: Self-pay

## 2018-10-12 DIAGNOSIS — Z01419 Encounter for gynecological examination (general) (routine) without abnormal findings: Secondary | ICD-10-CM | POA: Insufficient documentation

## 2018-10-12 DIAGNOSIS — Z1231 Encounter for screening mammogram for malignant neoplasm of breast: Secondary | ICD-10-CM

## 2018-10-12 HISTORY — DX: Essential (primary) hypertension: I10

## 2018-10-12 NOTE — Progress Notes (Signed)
No complaints today.   Pap Smear: Pap smear completed today. Last Pap smear was in 2013 and normal per patient. Per patient has no history of an abnormal Pap smear. No Pap smear results are in Epic.  Physical exam: Breasts Breasts symmetrical. No skin abnormalities bilateral breasts. No nipple retraction bilateral breasts. No nipple discharge bilateral breasts. No lymphadenopathy. No lumps palpated bilateral breasts. No complaints of pain or tenderness on exam. Referred patient to the [facility] for a screening mammogram. Appointment scheduled for Tuesday, October 12, 2018 at 1240.        Pelvic/Bimanual   Ext Genitalia No lesions, no swelling and no discharge observed on external genitalia.         Vagina Vagina pink and normal texture. Vaginal atrophy. No lesions or discharge observed in vagina.          Cervix Cervix is present. Cervix pink and of normal texture. No discharge observed.     Uterus Uterus is present and palpable. Uterus in normal position and normal size.        Adnexae Bilateral ovaries present and palpable. No tenderness on palpation.         Rectovaginal No rectal exam completed today since patient had no rectal complaints. No skin abnormalities observed on exam.    Smoking History: Patient is a former smoker that quit 20 years ago.  Patient Navigation: Patient education provided. Access to services provided for patient through [facility] program.   Colorectal Cancer Screening: Per patient had a colonoscopy completed 10 years ago. No complaints today.   Breast and Cervical Cancer Risk Assessment: Patient has a family history of her mother and a maternal aunt having breast cancer. Patient has no known genetic mutations or history of radiation treatment to the chest before age 24. Patient has no history of cervical dysplasia, immunocompromised, or DES exposure in-utero.  Risk Assessment    Risk Scores      10/12/2018   Last edited by: Armond Hang, LPN   5-year risk: 2.8 %   Lifetime risk: 13.9 %

## 2018-10-12 NOTE — Patient Instructions (Signed)
Explained breast self awareness with [name]. Let patient know [facility] will cover Pap smears and HPV typing every 5 years unless has a history of abnormal Pap smears. Referred patient to the [facility] for a screening mammogram. Appointment scheduled for Tuesday, October 12, 2018 at 1240. Patient aware of appointment and will be there. Let patient know will follow up with her within the next couple weeks with results of Pap smear by letter or phone. Informed [facility] that [facility] will [facility] with her within the next couple of weeks with results of mammogram by letter or phone. [name] verbalized understanding.  Johanan Skorupski, Arvil Chaco, RN 1:14 PM

## 2018-10-14 ENCOUNTER — Encounter (HOSPITAL_COMMUNITY): Payer: Self-pay | Admitting: *Deleted

## 2018-10-14 LAB — CYTOLOGY - PAP
Diagnosis: NEGATIVE
HPV: NOT DETECTED

## 2018-10-20 ENCOUNTER — Encounter (HOSPITAL_COMMUNITY): Payer: Self-pay | Admitting: *Deleted

## 2018-10-20 NOTE — Progress Notes (Signed)
Letter mailed to patient with negative pap smear results. HPV was negative. Next pap smear due in five years. 

## 2018-11-30 ENCOUNTER — Other Ambulatory Visit: Payer: Self-pay

## 2018-11-30 ENCOUNTER — Ambulatory Visit (INDEPENDENT_AMBULATORY_CARE_PROVIDER_SITE_OTHER): Payer: No Typology Code available for payment source | Admitting: Registered Nurse

## 2018-11-30 ENCOUNTER — Encounter: Payer: Self-pay | Admitting: Registered Nurse

## 2018-11-30 VITALS — BP 146/86 | HR 87 | Temp 98.3°F | Resp 16 | Wt 226.0 lb

## 2018-11-30 DIAGNOSIS — M1712 Unilateral primary osteoarthritis, left knee: Secondary | ICD-10-CM | POA: Diagnosis not present

## 2018-11-30 DIAGNOSIS — Z13228 Encounter for screening for other metabolic disorders: Secondary | ICD-10-CM

## 2018-11-30 DIAGNOSIS — I10 Essential (primary) hypertension: Secondary | ICD-10-CM

## 2018-11-30 DIAGNOSIS — Z1329 Encounter for screening for other suspected endocrine disorder: Secondary | ICD-10-CM | POA: Diagnosis not present

## 2018-11-30 DIAGNOSIS — R0602 Shortness of breath: Secondary | ICD-10-CM | POA: Diagnosis not present

## 2018-11-30 DIAGNOSIS — Z130 Encounter for screening for diseases of the blood and blood-forming organs and certain disorders involving the immune mechanism: Secondary | ICD-10-CM

## 2018-11-30 DIAGNOSIS — Z13220 Encounter for screening for lipoid disorders: Secondary | ICD-10-CM

## 2018-11-30 MED ORDER — ALBUTEROL SULFATE HFA 108 (90 BASE) MCG/ACT IN AERS: INHALATION_SPRAY | RESPIRATORY_TRACT | 2 refills | Status: DC

## 2018-11-30 MED ORDER — HYDROCHLOROTHIAZIDE 25 MG PO TABS: 25.0000 mg | ORAL_TABLET | Freq: Every day | ORAL | 3 refills | Status: DC

## 2018-11-30 MED ORDER — TRAMADOL HCL 50 MG PO TABS: ORAL_TABLET | ORAL | 0 refills | Status: DC

## 2018-11-30 NOTE — Progress Notes (Signed)
Established Patient Office Visit  Subjective:  Patient ID: Virginia Cabrera, female    DOB: November 10, 1955  Age: 63 y.o. MRN: [id number]  CC:  Chief Complaint  Patient presents with  . Medication Management    3 month follow-up for med refills     HPI Virginia Cabrera presents for medication refills. Requests tramadol, albuterol, and HCTZ.  Her BP is high today in office after multiple checks. She reports that she hasn't been able to lose much weight and unfortunately a large part of that is her numerous injuries and OA in joints. She is trying to get surgery, and fortunately has reached a settlement with a past employer where she became injured.   She denies sxs of HTN. Feels well overall. Concerned for previous GFR of 54, has stopped taking NSAIDs. Will check CMP today.   Past Medical History:  Diagnosis Date  . Allergy   . Anxiety   . Arthritis   . Asthma   . Depression   . Hypertension     Past Surgical History:  Procedure Laterality Date  . CESAREAN SECTION    . TONSILLECTOMY      Family History  Problem Relation Age of Onset  . Breast cancer Mother   . Brain cancer Father   . Breast cancer Maternal Aunt   . Stroke Maternal Aunt     Social History   Socioeconomic History  . Marital status: Single    Spouse name: Not on file  . Number of children: 1  . Years of education: Not on file  . Highest education level: 12th grade  Occupational History  . Occupation: Groomer  Social Needs  . Financial resource strain: Not hard at all  . Food insecurity    Worry: Never true    Inability: Never true  . Transportation needs    Medical: No    Non-medical: No  Tobacco Use  . Smoking status: Former Smoker    Packs/day: 0.20    Years: 15.00    Pack years: 3.00    Types: Cigarettes    Start date: 08/24/1983    Quit date: 08/24/1998    Years since quitting: 20.2  . Smokeless tobacco: Never Used  Substance and Sexual Activity  . Alcohol use: Yes    Comment:  occssionally  . Drug use: No  . Sexual activity: Not on file  Lifestyle  . Physical activity    Days per week: 0 days    Minutes per session: 0 min  . Stress: Not at all  Relationships  . Social Herbalist on phone: Three times a week    Gets together: Twice a week    Attends religious service: 1 to 4 times per year    Active member of club or organization: No    Attends meetings of clubs or organizations: Never    Relationship status: Never married  . Intimate partner violence    Fear of current or ex partner: No    Emotionally abused: No    Physically abused: No    Forced sexual activity: No  Other Topics Concern  . Not on file  Social History Narrative  . Not on file    Outpatient Medications Prior to Visit  Medication Sig Dispense Refill  . escitalopram (LEXAPRO) 10 MG tablet TK 1 T PO HS    . hydrOXYzine (ATARAX/VISTARIL) 10 MG tablet TK 1 T PO BID PRN    . ibuprofen (ADVIL,MOTRIN) 800 MG  tablet Take 1 tablet (800 mg total) by mouth 3 (three) times daily. 21 tablet 0  . meloxicam (MOBIC) 7.5 MG tablet Take 7.5 mg by mouth daily.    . traZODone (DESYREL) 100 MG tablet TK 1 T PO HS PRN    . albuterol (VENTOLIN HFA) 108 (90 Base) MCG/ACT inhaler INHALE 1 PUFF BY MOUTH EVERY 4 TO 6 HOURS AS NEEDED FOR 30 DAYS    . hydrochlorothiazide (HYDRODIURIL) 12.5 MG tablet TAKE 1 TABLET BY MOUTH ONCE DAILY FOR 90 DAYS    . hydrochlorothiazide (MICROZIDE) 12.5 MG capsule Take 12.5 mg by mouth daily.    . traMADol (ULTRAM) 50 MG tablet TAKE 1 TABLET BY MOUTH EVERY DAY AT BEDTIME AS NEEDED FOR PAIN     No facility-administered medications prior to visit.     Allergies  Allergen Reactions  . Morphine And Related     ROS Review of Systems  Constitutional: Negative.   HENT: Negative.   Eyes: Negative.   Respiratory: Negative.  Negative for chest tightness, shortness of breath and wheezing.   Cardiovascular: Negative.  Negative for chest pain, palpitations and leg  swelling.  Gastrointestinal: Negative.   Endocrine: Negative.   Genitourinary: Negative.   Musculoskeletal: Negative.   Skin: Negative.   Allergic/Immunologic: Negative.   Neurological: Negative.   Hematological: Negative.   Psychiatric/Behavioral: Negative.   All other systems reviewed and are negative.     Objective:    Physical Exam  Constitutional: She is oriented to person, place, and time. She appears well-developed and well-nourished. No distress.  Cardiovascular: Normal rate and regular rhythm.  Pulmonary/Chest: Effort normal. No respiratory distress.  Neurological: She is alert and oriented to person, place, and time.  Skin: Skin is warm and dry. No rash noted. She is not diaphoretic. No erythema. No pallor.  Psychiatric: She has a normal mood and affect. Her behavior is normal. Judgment and thought content normal.  Nursing note and vitals reviewed.   BP (!) 146/86   Pulse 87   Temp 98.3 F (36.8 C) (Oral)   Resp 16   Wt 226 lb (102.5 kg)   SpO2 99%   BMI 40.03 kg/m  Wt Readings from Last 3 Encounters:  11/30/18 226 lb (102.5 kg)  10/12/18 228 lb (103.4 kg)  08/24/18 232 lb (105.2 kg)     Health Maintenance Due  Topic Date Due  . Hepatitis C Screening  January 26, 1956  . HIV Screening  02/05/1973  . TETANUS/TDAP  02/05/1977  . COLONOSCOPY  02/06/2008    There are no preventive care reminders to display for this patient.  No results found for: TSH Lab Results  Component Value Date   WBC 6.9 08/24/2018   HGB 14.1 08/24/2018   HCT 41.1 08/24/2018   MCV 91 08/24/2018   PLT 262 08/24/2018   Lab Results  Component Value Date   NA 140 08/24/2018   K 4.1 08/24/2018   CO2 21 08/24/2018   GLUCOSE 98 08/24/2018   BUN 18 08/24/2018   CREATININE 1.11 (H) 08/24/2018   BILITOT 0.2 08/24/2018   ALKPHOS 65 08/24/2018   AST 44 (H) 08/24/2018   ALT 45 (H) 08/24/2018   PROT 7.6 08/24/2018   ALBUMIN 4.7 08/24/2018   CALCIUM 9.8 08/24/2018   Lab Results   Component Value Date   CHOL 220 (H) 08/24/2018   Lab Results  Component Value Date   HDL 57 08/24/2018   Lab Results  Component Value Date   LDLCALC 122 (H)  08/24/2018   Lab Results  Component Value Date   TRIG 203 (H) 08/24/2018   Lab Results  Component Value Date   CHOLHDL 3.9 08/24/2018   Lab Results  Component Value Date   HGBA1C 5.6 08/24/2018      Assessment & Plan:   Problem List Items Addressed This Visit    None    Visit Diagnoses    Screening for hyperlipidemia    -  Primary   Relevant Orders   Lipid panel   Screening for endocrine, metabolic and immunity disorder       Relevant Orders   CMP14+EGFR   Osteoarthritis of left knee, unspecified osteoarthritis type       Relevant Medications   traMADol (ULTRAM) 50 MG tablet   Essential hypertension       Relevant Medications   hydrochlorothiazide (HYDRODIURIL) 25 MG tablet   Shortness of breath       Relevant Medications   albuterol (VENTOLIN HFA) 108 (90 Base) MCG/ACT inhaler      Meds ordered this encounter  Medications  . albuterol (VENTOLIN HFA) 108 (90 Base) MCG/ACT inhaler    Sig: INHALE 1 PUFF BY MOUTH EVERY 4 TO 6 HOURS AS NEEDED    Dispense:  18 g    Refill:  2    Order Specific Question:   Supervising Provider    Answer:   Delia Chimes A [id number]  . traMADol (ULTRAM) 50 MG tablet    Sig: TAKE 1 TABLET BY MOUTH EVERY DAY AT BEDTIME AS NEEDED FOR PAIN    Dispense:  30 tablet    Refill:  0    Order Specific Question:   Supervising Provider    Answer:   Delia Chimes A [id number]  . hydrochlorothiazide (HYDRODIURIL) 25 MG tablet    Sig: Take 1 tablet (25 mg total) by mouth daily.    Dispense:  90 tablet    Refill:  3    Order Specific Question:   Supervising Provider    Answer:   Forrest Moron [id number]    Follow-up: No follow-ups on file.   PLAN  Labs drawn: CMP and lipid panel. Will form follow up plan based on results. If GFR stays steady or improves, will plan to see her  at less frequent intervals. Otherwise, may consider nephrology referral.   HCTZ increased to 75m PO qd   Patient encouraged to call clinic with any questions, comments, or concerns.   RMaximiano Coss NP

## 2018-11-30 NOTE — Patient Instructions (Signed)
° ° ° °  If you have lab work done today you will be contacted with your lab results within the next 2 weeks.  If you have not heard from us then please contact us. The fastest way to get your results is to register for My Chart. ° ° °IF you received an x-ray today, you will receive an invoice from [facility]. Please contact [facility] at [phone] with questions or concerns regarding your invoice.  ° °IF you received labwork today, you will receive an invoice from [facility]. Please contact [facility] at [phone] with questions or concerns regarding your invoice.  ° °Our billing staff will not be able to assist you with questions regarding bills from these companies. ° °You will be contacted with the lab results as soon as they are available. The fastest way to get your results is to activate your My Chart account. Instructions are located on the last page of this paperwork. If you have not heard from us regarding the results in 2 weeks, please contact this office. °  ° ° ° °

## 2018-12-01 ENCOUNTER — Encounter: Payer: Self-pay | Admitting: Registered Nurse

## 2018-12-01 LAB — CMP14+EGFR
ALT: 43 IU/L — ABNORMAL HIGH (ref 0–32)
AST: 41 IU/L — ABNORMAL HIGH (ref 0–40)
Albumin/Globulin Ratio: 1.6 (ref 1.2–2.2)
Albumin: 4.6 g/dL (ref 3.8–4.9)
Alkaline Phosphatase: 81 IU/L (ref 39–117)
BUN/Creatinine Ratio: 15 (ref 12–28)
BUN: 17 mg/dL (ref 8–27)
Bilirubin Total: 0.4 mg/dL (ref 0.0–1.2)
CO2: 24 mmol/L (ref 20–29)
Calcium: 9.8 mg/dL (ref 8.7–10.3)
Chloride: 102 mmol/L (ref 96–106)
Creatinine, Ser: 1.11 mg/dL — ABNORMAL HIGH (ref 0.57–1.00)
GFR calc Af Amer: 62 mL/min/{1.73_m2} (ref >59)
GFR calc non Af Amer: 54 mL/min/{1.73_m2} — ABNORMAL LOW (ref >59)
Globulin, Total: 2.9 g/dL (ref 1.5–4.5)
Glucose: 89 mg/dL (ref 65–99)
Potassium: 4.1 mmol/L (ref 3.5–5.2)
Sodium: 142 mmol/L (ref 134–144)
Total Protein: 7.5 g/dL (ref 6.0–8.5)

## 2018-12-01 LAB — LIPID PANEL
Chol/HDL Ratio: 3.4 ratio (ref 0.0–4.4)
Cholesterol, Total: 203 mg/dL — ABNORMAL HIGH (ref 100–199)
HDL: 60 mg/dL (ref >39)
LDL Chol Calc (NIH): 112 mg/dL — ABNORMAL HIGH (ref 0–99)
Triglycerides: 179 mg/dL — ABNORMAL HIGH (ref 0–149)
VLDL Cholesterol Cal: 31 mg/dL (ref 5–40)

## 2018-12-01 NOTE — Progress Notes (Signed)
Results steady from last labs Letter sent to patient  Kathrin Ruddy, NP

## 2018-12-10 ENCOUNTER — Telehealth: Payer: Self-pay | Admitting: Registered Nurse

## 2018-12-10 DIAGNOSIS — S46011A Strain of muscle(s) and tendon(s) of the rotator cuff of right shoulder, initial encounter: Secondary | ICD-10-CM

## 2018-12-10 NOTE — Telephone Encounter (Signed)
Pt waiting on referral to [facility] for her left knee. She said Delfino Lovett was to put in last week, but he has not and they do not have.

## 2018-12-17 ENCOUNTER — Ambulatory Visit (INDEPENDENT_AMBULATORY_CARE_PROVIDER_SITE_OTHER): Payer: No Typology Code available for payment source | Admitting: Registered Nurse

## 2018-12-17 ENCOUNTER — Other Ambulatory Visit: Payer: Self-pay | Admitting: Registered Nurse

## 2018-12-17 ENCOUNTER — Other Ambulatory Visit: Payer: Self-pay

## 2018-12-17 ENCOUNTER — Encounter: Payer: Self-pay | Admitting: Registered Nurse

## 2018-12-17 VITALS — BP 143/79 | HR 82 | Temp 98.8°F | Resp 16 | Wt 227.0 lb

## 2018-12-17 DIAGNOSIS — K219 Gastro-esophageal reflux disease without esophagitis: Secondary | ICD-10-CM | POA: Diagnosis not present

## 2018-12-17 MED ORDER — ESOMEPRAZOLE MAGNESIUM 40 MG PO CPDR: 40.0000 mg | DELAYED_RELEASE_CAPSULE | Freq: Every day | ORAL | 2 refills | Status: DC

## 2018-12-17 MED ORDER — SUCRALFATE 1 G PO TABS: 1.0000 g | ORAL_TABLET | Freq: Three times a day (TID) | ORAL | 1 refills | Status: DC

## 2018-12-17 MED ORDER — SUCRALFATE 1 GM/10ML PO SUSP: 1.0000 g | Freq: Three times a day (TID) | ORAL | 0 refills | Status: DC

## 2018-12-17 NOTE — Patient Instructions (Signed)
° ° ° °  If you have lab work done today you will be contacted with your lab results within the next 2 weeks.  If you have not heard from us then please contact us. The fastest way to get your results is to register for My Chart. ° ° °IF you received an x-ray today, you will receive an invoice from [facility]. Please contact [facility] at [phone] with questions or concerns regarding your invoice.  ° °IF you received labwork today, you will receive an invoice from [facility]. Please contact [facility] at [phone] with questions or concerns regarding your invoice.  ° °Our billing staff will not be able to assist you with questions regarding bills from these companies. ° °You will be contacted with the lab results as soon as they are available. The fastest way to get your results is to activate your My Chart account. Instructions are located on the last page of this paperwork. If you have not heard from us regarding the results in 2 weeks, please contact this office. °  ° ° ° °

## 2018-12-17 NOTE — Progress Notes (Signed)
Acute Office Visit  Subjective:    Patient ID: Virginia Cabrera, female    DOB: 09-May-1955, 63 y.o.   MRN: [id number]  Chief Complaint  Patient presents with  . Gastroesophageal Reflux    pt states x 3 weeks her feflux has been really bad and painful. She states she has tried OTC medications and it is not helping.     HPI Patient is in today for GERD  She suspects that this is related to her weight gain and anxiety over the previous year. She has stressed about a work injury, money, and recovery from this injury. The injury has come with associated weight gain.  She reports epigastric pain that radiates mildly upwards and laterally. She denies chest pain, shob, doe, headache, visual changes, other sensory changes, changes in frequency or consistency of stool, blood in stool, melena, hematochezia, cough.  States the pain comes on with anxiety. She is already trying to avoid trigger foods and makes sure that she eats multiple hours before bed. She has tried OTCs for short durations without relief. She has had GERD before.   Past Medical History:  Diagnosis Date  . Allergy   . Anxiety   . Arthritis   . Asthma   . Depression   . Hypertension     Past Surgical History:  Procedure Laterality Date  . CESAREAN SECTION    . TONSILLECTOMY      Family History  Problem Relation Age of Onset  . Breast cancer Mother   . Brain cancer Father   . Breast cancer Maternal Aunt   . Stroke Maternal Aunt     Social History   Socioeconomic History  . Marital status: Single    Spouse name: Not on file  . Number of children: 1  . Years of education: Not on file  . Highest education level: 12th grade  Occupational History  . Occupation: Groomer  Social Needs  . Financial resource strain: Not hard at all  . Food insecurity    Worry: Never true    Inability: Never true  . Transportation needs    Medical: No    Non-medical: No  Tobacco Use  . Smoking status: Former Smoker   Packs/day: 0.20    Years: 15.00    Pack years: 3.00    Types: Cigarettes    Start date: 08/24/1983    Quit date: 08/24/1998    Years since quitting: 20.3  . Smokeless tobacco: Never Used  Substance and Sexual Activity  . Alcohol use: Yes    Comment: occssionally  . Drug use: No  . Sexual activity: Not on file  Lifestyle  . Physical activity    Days per week: 0 days    Minutes per session: 0 min  . Stress: Not at all  Relationships  . Social Musician on phone: Three times a week    Gets together: Twice a week    Attends religious service: 1 to 4 times per year    Active member of club or organization: No    Attends meetings of clubs or organizations: Never    Relationship status: Never married  . Intimate partner violence    Fear of current or ex partner: No    Emotionally abused: No    Physically abused: No    Forced sexual activity: No  Other Topics Concern  . Not on file  Social History Narrative  . Not on file    Outpatient Medications  Prior to Visit  Medication Sig Dispense Refill  . albuterol (VENTOLIN HFA) 108 (90 Base) MCG/ACT inhaler INHALE 1 PUFF BY MOUTH EVERY 4 TO 6 HOURS AS NEEDED 18 g 2  . escitalopram (LEXAPRO) 10 MG tablet TK 1 T PO HS    . hydrochlorothiazide (HYDRODIURIL) 25 MG tablet Take 1 tablet (25 mg total) by mouth daily. 90 tablet 3  . hydrOXYzine (ATARAX/VISTARIL) 10 MG tablet TK 1 T PO BID PRN    . ibuprofen (ADVIL,MOTRIN) 800 MG tablet Take 1 tablet (800 mg total) by mouth 3 (three) times daily. 21 tablet 0  . meloxicam (MOBIC) 7.5 MG tablet Take 7.5 mg by mouth daily.    . traMADol (ULTRAM) 50 MG tablet TAKE 1 TABLET BY MOUTH EVERY DAY AT BEDTIME AS NEEDED FOR PAIN 30 tablet 0  . traZODone (DESYREL) 100 MG tablet TK 1 T PO HS PRN     No facility-administered medications prior to visit.     Allergies  Allergen Reactions  . Morphine And Related     Review of Systems  Constitutional: Negative.   HENT: Negative.   Eyes:  Negative.   Respiratory: Negative.  Negative for cough and shortness of breath.   Cardiovascular: Negative.  Negative for chest pain and palpitations.  Gastrointestinal: Positive for abdominal pain and heartburn. Negative for blood in stool, constipation, diarrhea, melena and nausea.  Genitourinary: Negative.  Negative for flank pain.  Musculoskeletal: Negative.  Negative for myalgias.  Skin: Negative.   Neurological: Negative.   Endo/Heme/Allergies: Negative.   Psychiatric/Behavioral: Negative.   All other systems reviewed and are negative.      Objective:    Physical Exam  Constitutional: She is oriented to person, place, and time. She appears well-developed and well-nourished. No distress.  Cardiovascular: Normal rate and regular rhythm.  Pulmonary/Chest: Effort normal. No respiratory distress.  Neurological: She is alert and oriented to person, place, and time.  Skin: Skin is warm and dry. No rash noted. She is not diaphoretic. No erythema. No pallor.  Psychiatric: She has a normal mood and affect. Her behavior is normal. Judgment and thought content normal.  Nursing note and vitals reviewed.   BP (!) 143/79   Pulse 82   Temp 98.8 F (37.1 C) (Oral)   Resp 16   Wt 227 lb (103 kg)   SpO2 97%   BMI 40.21 kg/m  Wt Readings from Last 3 Encounters:  12/17/18 227 lb (103 kg)  11/30/18 226 lb (102.5 kg)  10/12/18 228 lb (103.4 kg)    Health Maintenance Due  Topic Date Due  . Hepatitis C Screening  1955/08/30  . HIV Screening  02/05/1973  . TETANUS/TDAP  02/05/1977  . COLONOSCOPY  02/06/2008    There are no preventive care reminders to display for this patient.   No results found for: TSH Lab Results  Component Value Date   WBC 6.9 08/24/2018   HGB 14.1 08/24/2018   HCT 41.1 08/24/2018   MCV 91 08/24/2018   PLT 262 08/24/2018   Lab Results  Component Value Date   NA 142 11/30/2018   K 4.1 11/30/2018   CO2 24 11/30/2018   GLUCOSE 89 11/30/2018   BUN 17  11/30/2018   CREATININE 1.11 (H) 11/30/2018   BILITOT 0.4 11/30/2018   ALKPHOS 81 11/30/2018   AST 41 (H) 11/30/2018   ALT 43 (H) 11/30/2018   PROT 7.5 11/30/2018   ALBUMIN 4.6 11/30/2018   CALCIUM 9.8 11/30/2018   Lab Results  Component Value Date   CHOL 203 (H) 11/30/2018   Lab Results  Component Value Date   HDL 60 11/30/2018   Lab Results  Component Value Date   LDLCALC 112 (H) 11/30/2018   Lab Results  Component Value Date   TRIG 179 (H) 11/30/2018   Lab Results  Component Value Date   CHOLHDL 3.4 11/30/2018   Lab Results  Component Value Date   HGBA1C 5.6 08/24/2018       Assessment & Plan:   Problem List Items Addressed This Visit    None    Visit Diagnoses    Gastroesophageal reflux disease, unspecified whether esophagitis present    -  Primary   Relevant Medications   esomeprazole (NEXIUM) 40 MG capsule   sucralfate (CARAFATE) 1 GM/10ML suspension       Meds ordered this encounter  Medications  . esomeprazole (NEXIUM) 40 MG capsule    Sig: Take 1 capsule (40 mg total) by mouth daily at 12 noon.    Dispense:  28 capsule    Refill:  2    Order Specific Question:   Supervising Provider    Answer:   Collie SiadSTALLINGS, ZOE A [id number]]  . sucralfate (CARAFATE) 1 GM/10ML suspension    Sig: Take 10 mLs (1 g total) by mouth 4 (four) times daily -  with meals and at bedtime.    Dispense:  420 mL    Refill:  0    Order Specific Question:   Supervising Provider    Answer:   Doristine BosworthSTALLINGS, ZOE A [id number]]   PLAN  Esomeprazole 40mg  PO qd, discussed to take this first thing in the morning around 30 minutes before first meal with a large glass of water. Discussed taking for a 14 day course for optimal relief of symptoms  Sucralfate solution tid  Return to clinic if symptoms worsen or fail to improve  Patient encouraged to call clinic with any questions, comments, or concerns.    Janeece Ageeichard Vernisha Bacote, NP

## 2018-12-29 ENCOUNTER — Other Ambulatory Visit: Payer: Self-pay

## 2018-12-29 ENCOUNTER — Ambulatory Visit (INDEPENDENT_AMBULATORY_CARE_PROVIDER_SITE_OTHER): Payer: No Typology Code available for payment source | Admitting: Registered Nurse

## 2018-12-29 ENCOUNTER — Encounter: Payer: Self-pay | Admitting: Registered Nurse

## 2018-12-29 VITALS — BP 130/87 | HR 75 | Temp 98.4°F | Ht 63.0 in | Wt 226.0 lb

## 2018-12-29 DIAGNOSIS — I10 Essential (primary) hypertension: Secondary | ICD-10-CM | POA: Diagnosis not present

## 2018-12-29 DIAGNOSIS — R9431 Abnormal electrocardiogram [ECG] [EKG]: Secondary | ICD-10-CM | POA: Diagnosis not present

## 2018-12-29 MED ORDER — LOSARTAN POTASSIUM 100 MG PO TABS: 100.0000 mg | ORAL_TABLET | Freq: Every day | ORAL | 3 refills | Status: DC

## 2018-12-29 NOTE — Patient Instructions (Signed)
° ° ° °  If you have lab work done today you will be contacted with your lab results within the next 2 weeks.  If you have not heard from us then please contact us. The fastest way to get your results is to register for My Chart. ° ° °IF you received an x-ray today, you will receive an invoice from [facility]. Please contact [facility] at [phone] with questions or concerns regarding your invoice.  ° °IF you received labwork today, you will receive an invoice from [facility]. Please contact [facility] at [phone] with questions or concerns regarding your invoice.  ° °Our billing staff will not be able to assist you with questions regarding bills from these companies. ° °You will be contacted with the lab results as soon as they are available. The fastest way to get your results is to activate your My Chart account. Instructions are located on the last page of this paperwork. If you have not heard from us regarding the results in 2 weeks, please contact this office. °  ° ° ° °

## 2018-12-30 ENCOUNTER — Encounter: Payer: Self-pay | Admitting: Registered Nurse

## 2018-12-30 LAB — BASIC METABOLIC PANEL
BUN/Creatinine Ratio: 15 (ref 12–28)
BUN: 17 mg/dL (ref 8–27)
CO2: 21 mmol/L (ref 20–29)
Calcium: 9.8 mg/dL (ref 8.7–10.3)
Chloride: 100 mmol/L (ref 96–106)
Creatinine, Ser: 1.12 mg/dL — ABNORMAL HIGH (ref 0.57–1.00)
GFR calc Af Amer: 62 mL/min/{1.73_m2} (ref >59)
GFR calc non Af Amer: 54 mL/min/{1.73_m2} — ABNORMAL LOW (ref >59)
Glucose: 114 mg/dL — ABNORMAL HIGH (ref 65–99)
Potassium: 3.3 mmol/L — ABNORMAL LOW (ref 3.5–5.2)
Sodium: 140 mmol/L (ref 134–144)

## 2018-12-30 LAB — MAGNESIUM: Magnesium: 2 mg/dL (ref 1.6–2.3)

## 2018-12-30 NOTE — Progress Notes (Signed)
Established Patient Office Visit  Subjective:  Patient ID: Virginia Cabrera, female    DOB: 18-Jun-1955  Age: 63 y.o. MRN: [id number]  CC:  Chief Complaint  Patient presents with  . Follow-up    pt stated went to ER for low pottasium and heart beat is irregular.    HPI Virginia LunaLarraine W Turley presents for hospital follow up.  [place]/18 when she awoke overnight with excruciating leg cramps, cold sweats, and feelings like she was going to black out. Concerned for cardiac etiology, [facility] was [facility], where it was determined that her K was low to 3.3 and she was experiencing QT prolongation on EKG. She was stabilized with PO K supplementation and subsequently discharged. She states these symptoms have not recurred, though she still has some soreness in her legs. Denies headache, chest pain, shob, doe, dependent edema, visual changes, palpitations. Still concerned for her cardiac health, she presents today for hospital follow up and coordination in next steps of care.   Past Medical History:  Diagnosis Date  . Allergy   . Anxiety   . Arthritis   . Asthma   . Depression   . Hypertension     Past Surgical History:  Procedure Laterality Date  . CESAREAN SECTION    . TONSILLECTOMY      Family History  Problem Relation Age of Onset  . Breast cancer Mother   . Brain cancer Father   . Breast cancer Maternal Aunt   . Stroke Maternal Aunt     Social History   Socioeconomic History  . Marital status: Single    Spouse name: Not on file  . Number of children: 1  . Years of education: Not on file  . Highest education level: 12th grade  Occupational History  . Occupation: Groomer  Social Needs  . Financial resource strain: Not hard at all  . Food insecurity    Worry: Never true    Inability: Never true  . Transportation needs    Medical: No    Non-medical: No  Tobacco Use  . Smoking status: Former Smoker    Packs/day: 0.20     Years: 15.00    Pack years: 3.00    Types: Cigarettes    Start date: 08/24/1983    Quit date: 08/24/1998    Years since quitting: 20.3  . Smokeless tobacco: Never Used  Substance and Sexual Activity  . Alcohol use: Yes    Comment: occssionally  . Drug use: No  . Sexual activity: Not on file  Lifestyle  . Physical activity    Days per week: 0 days    Minutes per session: 0 min  . Stress: Not at all  Relationships  . Social Musicianconnections    Talks on phone: Three times a week    Gets together: Twice a week    Attends religious service: 1 to 4 times per year    Active member of club or organization: No    Attends meetings of clubs or organizations: Never    Relationship status: Never married  . Intimate partner violence    Fear of current or ex partner: No    Emotionally abused: No    Physically abused: No    Forced sexual activity: No  Other Topics Concern  . Not on file  Social History Narrative  . Not on file    Outpatient Medications Prior to Visit  Medication Sig  Dispense Refill  . albuterol (VENTOLIN HFA) 108 (90 Base) MCG/ACT inhaler INHALE 1 PUFF BY MOUTH EVERY 4 TO 6 HOURS AS NEEDED 18 g 2  . celecoxib (CELEBREX) 200 MG capsule TAKE 1 CAPSULE BY MOUTH ONCE DAILY FOR 30 DAYS    . escitalopram (LEXAPRO) 10 MG tablet TK 1 T PO HS    . esomeprazole (NEXIUM) 40 MG capsule Take 1 capsule (40 mg total) by mouth daily at 12 noon. 28 capsule 2  . hydrochlorothiazide (HYDRODIURIL) 25 MG tablet Take 1 tablet (25 mg total) by mouth daily. 90 tablet 3  . hydrOXYzine (ATARAX/VISTARIL) 10 MG tablet TK 1 T PO BID PRN    . ibuprofen (ADVIL,MOTRIN) 800 MG tablet Take 1 tablet (800 mg total) by mouth 3 (three) times daily. 21 tablet 0  . sucralfate (CARAFATE) 1 g tablet Take 1 tablet (1 g total) by mouth 4 (four) times daily -  with meals and at bedtime. 56 tablet 1  . traMADol (ULTRAM) 50 MG tablet TAKE 1 TABLET BY MOUTH EVERY DAY AT BEDTIME AS NEEDED FOR PAIN 30 tablet 0  .  traZODone (DESYREL) 100 MG tablet TK 1 T PO HS PRN    . meloxicam (MOBIC) 7.5 MG tablet Take 7.5 mg by mouth daily.     No facility-administered medications prior to visit.     Allergies  Allergen Reactions  . Morphine And Related     ROS Review of Systems  Constitutional: Negative.  Negative for diaphoresis, fatigue and unexpected weight change.  HENT: Negative.   Eyes: Negative.   Respiratory: Negative.  Negative for cough, choking, chest tightness and shortness of breath.   Cardiovascular: Negative.  Negative for chest pain, palpitations and leg swelling.  Gastrointestinal: Negative.   Endocrine: Negative.   Genitourinary: Negative.   Musculoskeletal: Negative.   Skin: Negative.  Negative for pallor.  Allergic/Immunologic: Negative.   Neurological: Negative.  Negative for headaches.  Hematological: Negative.   Psychiatric/Behavioral: Negative.   All other systems reviewed and are negative.     Objective:    Physical Exam  Constitutional: She is oriented to person, place, and time. She appears well-developed and well-nourished. No distress.  Cardiovascular: Normal rate and regular rhythm.  Pulmonary/Chest: Effort normal. No respiratory distress.  Neurological: She is alert and oriented to person, place, and time. No cranial nerve deficit.  Skin: Skin is warm and dry. No rash noted. She is not diaphoretic. No erythema. No pallor.  Psychiatric: She has a normal mood and affect. Her behavior is normal. Judgment and thought content normal.  Nursing note and vitals reviewed.   BP 130/87 (BP Location: Right Arm, Patient Position: Sitting, Cuff Size: Normal)   Pulse 75   Temp 98.4 F (36.9 C)   Ht 5\' 3"  (1.6 m)   Wt 226 lb (102.5 kg)   SpO2 98%   BMI 40.03 kg/m  Wt Readings from Last 3 Encounters:  12/29/18 226 lb (102.5 kg)  12/17/18 227 lb (103 kg)  11/30/18 226 lb (102.5 kg)     Health Maintenance Due  Topic Date Due  . Hepatitis C Screening  1956-02-16  .  HIV Screening  02/05/1973  . TETANUS/TDAP  02/05/1977  . COLONOSCOPY  02/06/2008    There are no preventive care reminders to display for this patient.  No results found for: TSH Lab Results  Component Value Date   WBC 6.9 08/24/2018   HGB 14.1 08/24/2018   HCT 41.1 08/24/2018   MCV 91 08/24/2018  PLT 262 08/24/2018   Lab Results  Component Value Date   NA 140 12/29/2018   K 3.3 (L) 12/29/2018   CO2 21 12/29/2018   GLUCOSE 114 (H) 12/29/2018   BUN 17 12/29/2018   CREATININE 1.12 (H) 12/29/2018   BILITOT 0.4 11/30/2018   ALKPHOS 81 11/30/2018   AST 41 (H) 11/30/2018   ALT 43 (H) 11/30/2018   PROT 7.5 11/30/2018   ALBUMIN 4.6 11/30/2018   CALCIUM 9.8 12/29/2018   Lab Results  Component Value Date   CHOL 203 (H) 11/30/2018   Lab Results  Component Value Date   HDL 60 11/30/2018   Lab Results  Component Value Date   LDLCALC 112 (H) 11/30/2018   Lab Results  Component Value Date   TRIG 179 (H) 11/30/2018   Lab Results  Component Value Date   CHOLHDL 3.4 11/30/2018   Lab Results  Component Value Date   HGBA1C 5.6 08/24/2018      Assessment & Plan:   Problem List Items Addressed This Visit    None    Visit Diagnoses    QT prolongation    -  Primary   Relevant Orders   EKG 12-Lead   Ambulatory referral to Cardiology   Basic Metabolic Panel (Completed)   Magnesium (Completed)   Essential hypertension       Relevant Medications   losartan (COZAAR) 100 MG tablet      Meds ordered this encounter  Medications  . losartan (COZAAR) 100 MG tablet    Sig: Take 1 tablet (100 mg total) by mouth daily.    Dispense:  90 tablet    Refill:  3    Order Specific Question:   Supervising Provider    Answer:   Forrest Moron [id number]    Follow-up: No follow-ups on file.   PLAN  EKG on site today is wnl, besides precordial low voltage, which may be expected given patient's size. We will draw BMP to check K levels and Mg to check levels.   Referral to  cardiology. She has been seen in cardiology before and is familiar with stress tests.   If K is low, will consider PO supplementation vs sending to ED for IV supplementation.  Discussed etiologies of QT prolongation - this may be related to low K, but there may be other influencing factors. As a way to ensure we avoid low K, we will stop HCTZ. We will start losartan 100mg  PO qd.   Discussed ED precautions.   Patient encouraged to call clinic with any questions, comments, or concerns.   Maximiano Coss, NP

## 2019-01-10 ENCOUNTER — Other Ambulatory Visit: Payer: Self-pay | Admitting: Registered Nurse

## 2019-01-10 DIAGNOSIS — M1712 Unilateral primary osteoarthritis, left knee: Secondary | ICD-10-CM

## 2019-01-10 NOTE — Telephone Encounter (Signed)
Requested medication (s) are due for refill today: yes  Requested medication (s) are on the active medication list: yes  Last refill:  11/30/2018  Future visit scheduled: no  Notes to clinic: refill cannot be delegated    Requested Prescriptions  Pending Prescriptions Disp Refills   traMADol (ULTRAM) 50 MG tablet [Pharmacy Med Name: traMADol HCl 50 MG Oral Tablet] 30 tablet 0    Sig: TAKE 1 TABLET BY MOUTH EVERY DAY AT BEDTIME AS NEEDED FOR PAIN     Not Delegated - Analgesics:  Opioid Agonists Failed - 01/10/2019 10:27 AM      Failed - This refill cannot be delegated      Failed - Urine Drug Screen completed in last 360 days.      Passed - Valid encounter within last 6 months    Recent Outpatient Visits          1 week ago QT prolongation   [facility], [place], NP   3 weeks ago Gastroesophageal reflux disease, unspecified whether esophagitis present   [facility], Delfino Lovett, NP   1 month ago Screening for hyperlipidemia   [facility], Delfino Lovett, NP   4 months ago Encounter to establish care   [facility], Delfino Lovett, NP   9 months ago Traumatic incomplete tear of right rotator cuff, initial encounter   [place], Rebekah Chesterfield, MD      Future Appointments            In 4 weeks Nahser, Wonda Cheng, MD [place], [facility]

## 2019-02-07 ENCOUNTER — Encounter: Payer: Self-pay | Admitting: Cardiovascular Disease

## 2019-02-07 ENCOUNTER — Other Ambulatory Visit: Payer: Self-pay

## 2019-02-07 ENCOUNTER — Ambulatory Visit (INDEPENDENT_AMBULATORY_CARE_PROVIDER_SITE_OTHER): Payer: Self-pay | Admitting: Cardiovascular Disease

## 2019-02-07 VITALS — BP 150/94 | HR 79 | Ht 63.0 in | Wt 227.1 lb

## 2019-02-07 DIAGNOSIS — R0609 Other forms of dyspnea: Secondary | ICD-10-CM

## 2019-02-07 DIAGNOSIS — R9431 Abnormal electrocardiogram [ECG] [EKG]: Secondary | ICD-10-CM

## 2019-02-07 DIAGNOSIS — R0602 Shortness of breath: Secondary | ICD-10-CM

## 2019-02-07 DIAGNOSIS — R002 Palpitations: Secondary | ICD-10-CM

## 2019-02-07 DIAGNOSIS — R0600 Dyspnea, unspecified: Secondary | ICD-10-CM

## 2019-02-07 LAB — BASIC METABOLIC PANEL
BUN/Creatinine Ratio: 19 (ref 12–28)
BUN: 21 mg/dL (ref 8–27)
CO2: 21 mmol/L (ref 20–29)
Calcium: 9.6 mg/dL (ref 8.7–10.3)
Chloride: 104 mmol/L (ref 96–106)
Creatinine, Ser: 1.09 mg/dL — ABNORMAL HIGH (ref 0.57–1.00)
GFR calc Af Amer: 63 mL/min/{1.73_m2} (ref >59)
GFR calc non Af Amer: 55 mL/min/{1.73_m2} — ABNORMAL LOW (ref >59)
Glucose: 88 mg/dL (ref 65–99)
Potassium: 4.5 mmol/L (ref 3.5–5.2)
Sodium: 140 mmol/L (ref 134–144)

## 2019-02-07 MED ORDER — SPIRONOLACTONE 25 MG PO TABS: 25.0000 mg | ORAL_TABLET | Freq: Every day | ORAL | 11 refills | Status: DC

## 2019-02-07 NOTE — Patient Instructions (Addendum)
Medication Instructions:  Your physician has recommended you make the following change in your medication:  START Aldactone (Spironolactone) 25 mg once daily  *If you need a refill on your cardiac medications before your next appointment, please call your pharmacy*  Lab Work: TODAY - basic metabolic panel (kidney function, electrolytes)  Your physician recommends that you return for lab work in: 3 weeks for recheck of basic metabolic panel  If you have labs (blood work) drawn today and your tests are completely normal, you will receive your results only by: Marland Kitchen MyChart Message (if you have MyChart) OR . A paper copy in the mail If you have any lab test that is abnormal or we need to change your treatment, we will call you to review the results.   Testing/Procedures: Your physician has requested that you have an echocardiogram. Echocardiography is a painless test that uses sound waves to create images of your heart. It provides your doctor with information about the size and shape of your heart and how well your heart's chambers and valves are working. This procedure takes approximately one hour. There are no restrictions for this procedure.     Follow-Up: At [facility], you and your health needs are our priority.  As part of our continuing mission to provide you with exceptional heart care, we have created designated Provider Care Teams.  These Care Teams include your primary Cardiologist (physician) and Advanced Practice Providers (APPs -  Physician Assistants and Nurse Practitioners) who all work together to provide you with the care you need, when you need it.  Your next appointment:   6 week(s) on Thursday Jan. 7 at 9:30 am  The format for your next appointment:   In Person  Provider:   Daune Perch, NP

## 2019-02-07 NOTE — Progress Notes (Signed)
Cardiology Office Note:    Date:  02/07/2019   ID:  Virginia Cabrera, DOB [birth year], MRN [id number]  PCP:  Janeece AgeeMorrow, Richard, NP  Cardiologist: Braysen Cloward  Electrophysiologist:  None   Referring MD: Janeece AgeeMorrow, Richard, NP   Chief Complaint  Patient presents with  . Abnormal ECG     Long QT ( in the setting of potassium of 3.3     History of Present Illness:    Virginia Cabrera is a 63 y.o. female with a hx of HTN  Were are seeing her at the request of Janeece AgeeRichard Morrow for further evaluation of a long QT interval  Hx of HTN and obesity  Also has knee and back issues .   [place]. [place] [facility]   She had pre-syncope . Cold, clammy , felt pale,   Went to ER , found to have very low potassium level .  Was having lots of PVCs   She saw her primary medical doctor on October 21. At that time her potassium was still 3.3. QT interval was 418 ms with a QTC of 457 ms. She remains a little fatigued but has not had any further episodes of this leg cramping or cold and clammy sensation or presyncope.  She has a knee injury that is going to require surgery at some point. She is not able to get much exercise.  No cp , no nausea ,  No hematuria , hematachezia, , no rash ,  Has DOE with any exercise - admits that her weight is likely an issue.  Watches her salt intake Eats lots of potatoes and chocolate   .      Past Medical History:  Diagnosis Date  . Allergy   . Anxiety   . Arthritis   . Asthma   . Depression   . Hypertension     Past Surgical History:  Procedure Laterality Date  . CESAREAN SECTION    . TONSILLECTOMY      Current Medications: Current Meds  Medication Sig  . albuterol (VENTOLIN HFA) 108 (90 Base) MCG/ACT inhaler INHALE 1 PUFF BY MOUTH EVERY 4 TO 6 HOURS AS NEEDED  . celecoxib (CELEBREX) 200 MG capsule TAKE 1 CAPSULE BY MOUTH ONCE DAILY FOR 30 DAYS  . escitalopram (LEXAPRO) 20 MG tablet Take 20 mg by mouth at bedtime.   Marland Kitchen. esomeprazole (NEXIUM) 40 MG capsule Take 1 capsule (40 mg total) by mouth daily at 12 noon.  . hydrOXYzine (ATARAX/VISTARIL) 10 MG tablet TK 1 T PO BID PRN  . losartan (COZAAR) 100 MG tablet Take 1 tablet (100 mg total) by mouth daily.  . sucralfate (CARAFATE) 1 g tablet Take 1 tablet (1 g total) by mouth 4 (four) times daily -  with meals and at bedtime.  . traMADol (ULTRAM) 50 MG tablet TAKE 1 TABLET BY MOUTH EVERY DAY AT BEDTIME AS NEEDED FOR PAIN  . traZODone (DESYREL) 100 MG tablet TK 1 T PO HS PRN     Allergies:   Morphine and related   Social History   Socioeconomic History  . Marital status: Single    Spouse name: Not on file  . Number of children: 1  . Years of education: Not on file  . Highest education level: 12th grade  Occupational History  . Occupation: Groomer  Social Needs  . Financial resource strain: Not hard at all  . Food insecurity    Worry: Never true  Inability: Never true  . Transportation needs    Medical: No    Non-medical: No  Tobacco Use  . Smoking status: Former Smoker    Packs/day: 0.20    Years: 15.00    Pack years: 3.00    Types: Cigarettes    Start date: 08/24/1983    Quit date: 08/24/1998    Years since quitting: 20.4  . Smokeless tobacco: Never Used  Substance and Sexual Activity  . Alcohol use: Yes    Comment: occssionally  . Drug use: No  . Sexual activity: Not on file  Lifestyle  . Physical activity    Days per week: 0 days    Minutes per session: 0 min  . Stress: Not at all  Relationships  . Social Musician on phone: Three times a week    Gets together: Twice a week    Attends religious service: 1 to 4 times per year    Active member of club or organization: No    Attends meetings of clubs or organizations: Never    Relationship status: Never married  Other Topics Concern  . Not on file  Social History Narrative  . Not on file     Family History: The patient's family history includes Brain cancer in  her father; Breast cancer in her maternal aunt and mother; Stroke in her maternal aunt.  ROS:   Please see the history of present illness.     All other systems reviewed and are negative.  EKGs/Labs/Other Studies Reviewed:    The following studies were reviewed today:   EKG:   Nov. 30, 2020:    NSR at 81.   Low voltage,  QTc is 470.    Recent Labs: 08/24/2018: Hemoglobin 14.1; Platelets 262 11/30/2018: ALT 43 12/29/2018: BUN 17; Creatinine, Ser 1.12; Magnesium 2.0; Potassium 3.3; Sodium 140  Recent Lipid Panel    Component Value Date/Time   CHOL 203 (H) 11/30/2018 1447   TRIG 179 (H) 11/30/2018 1447   HDL 60 11/30/2018 1447   CHOLHDL 3.4 11/30/2018 1447   LDLCALC 112 (H) 11/30/2018 1447    Physical Exam:    VS:  BP (!) 150/94   Pulse 79   Ht  (1.6 m)   Wt 227 lb 1.9 oz (103 kg)   SpO2 97%   BMI 40.23 kg/m     Wt Readings from Last 3 Encounters:  02/07/19 227 lb 1.9 oz (103 kg)  12/29/18 226 lb (102.5 kg)  12/17/18 227 lb (103 kg)     GEN:  Middle age, moderately obese female. No acute  distress HEENT: Normal NECK: No JVD; No carotid bruits LYMPHATICS: No lymphadenopathy CARDIAC:  RRR, no murmurs, rubs, gallops RESPIRATORY:  Clear to auscultation without rales, wheezing or rhonchi  ABDOMEN: Soft, non-tender, non-distended MUSCULOSKELETAL:  No edema; No deformity  SKIN: Warm and dry NEUROLOGIC:  Alert and oriented x 3 PSYCHIATRIC:  Normal affect   ASSESSMENT:    1. Prolonged Q-T interval on ECG   2. Palpitations   3. Shortness of breath    PLAN:    In order of problems listed above:  1. Prolonged QT : The patient was incidentally found to have a prolonged QT when she was seen in the [facility] emergency department for severe leg cramps and presyncope.  She does not have frequent premature ventricular contractions.  She does not member being told that she had torsades.  Her potassium was repleted in the emergency room  but she was not given a  prescription.  She presented back to see her medical doctor where she continued to have a slightly prolonged QT interval.  Her potassium remained low at 3.3.  Hydrochlorothiazide was discontinued and she was started on losartan.  She is not had any further symptoms but her blood pressure remains elevated.  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  I think she will do well with the addition of spironolactone.  We will add spironolactone 25 mg a day.  We will check a basic metabolic profile today.  Her QT interval remains borderline elevated at 478 ms.  2.  Dyspnea on exertion: Patient complains of shortness of breath on exertion.  She admits that she does not exercise on a regular basis.  I suspect a lot of her difficulties due to generalized deconditioning.  She is not been exercising for quite some time because of the knee and back injury.  We will get an echocardiogram for further evaluation of her cardiac function.  I encouraged her to work on her weight loss program.  She has an appointment to see orthopedics in a week and she anticipates needing surgery on her knee.   Medication Adjustments/Labs and Tests Ordered: Current medicines are reviewed at length with the patient today.  Concerns regarding medicines are outlined above.  Orders Placed This Encounter  Procedures  . Basic Metabolic Panel (BMET)  . Basic Metabolic Panel (BMET)  . EKG 12-Lead  . ECHOCARDIOGRAM COMPLETE   Meds ordered this encounter  Medications  . spironolactone (ALDACTONE) 25 MG tablet    Sig: Take 1 tablet (25 mg total) by mouth daily.    Dispense:  30 tablet    Refill:  11    Patient Instructions  Medication Instructions:  Your physician has recommended you make the following change in your medication:  START Aldactone (Spironolactone) 25 mg once daily  *If you need a refill on your cardiac medications before your next appointment, please call your pharmacy*  Lab Work: TODAY - basic metabolic  panel (kidney function, electrolytes)  Your physician recommends that you return for lab work in: 3 weeks for recheck of basic metabolic panel  If you have labs (blood work) drawn today and your tests are completely normal, you will receive your results only by: Marland Kitchen MyChart Message (if you have MyChart) OR . A paper copy in the mail If you have any lab test that is abnormal or we need to change your treatment, we will call you to review the results.   Testing/Procedures: Your physician has requested that you have an echocardiogram. Echocardiography is a painless test that uses sound waves to create images of your heart. It provides your doctor with information about the size and shape of your heart and how well your heart's chambers and valves are working. This procedure takes approximately one hour. There are no restrictions for this procedure.     Follow-Up: At [facility], you and your health needs are our priority.  As part of our continuing mission to provide you with exceptional heart care, we have created designated Provider Care Teams.  These Care Teams include your primary Cardiologist (physician) and Advanced Practice Providers (APPs -  Physician Assistants and Nurse Practitioners) who all work together to provide you with the care you need, when you need it.  Your next appointment:   6 week(s) on Thursday Jan. 7 at 9:30 am  The format for your next appointment:   In  Person  Provider:   Berton Bon, NP       Signed, Kristeen Miss, MD  02/07/2019 9:28 AM    [facility]

## 2019-02-28 ENCOUNTER — Ambulatory Visit (HOSPITAL_COMMUNITY): Payer: No Typology Code available for payment source | Attending: Cardiovascular Disease

## 2019-02-28 ENCOUNTER — Other Ambulatory Visit: Payer: Self-pay | Admitting: *Deleted

## 2019-02-28 ENCOUNTER — Other Ambulatory Visit: Payer: Self-pay

## 2019-02-28 DIAGNOSIS — R0602 Shortness of breath: Secondary | ICD-10-CM | POA: Diagnosis present

## 2019-02-28 DIAGNOSIS — R9431 Abnormal electrocardiogram [ECG] [EKG]: Secondary | ICD-10-CM | POA: Insufficient documentation

## 2019-02-28 DIAGNOSIS — R002 Palpitations: Secondary | ICD-10-CM | POA: Insufficient documentation

## 2019-03-01 LAB — BASIC METABOLIC PANEL
BUN/Creatinine Ratio: 13 (ref 12–28)
BUN: 16 mg/dL (ref 8–27)
CO2: 23 mmol/L (ref 20–29)
Calcium: 9.8 mg/dL (ref 8.7–10.3)
Chloride: 101 mmol/L (ref 96–106)
Creatinine, Ser: 1.22 mg/dL — ABNORMAL HIGH (ref 0.57–1.00)
GFR calc Af Amer: 55 mL/min/{1.73_m2} — ABNORMAL LOW (ref >59)
GFR calc non Af Amer: 48 mL/min/{1.73_m2} — ABNORMAL LOW (ref >59)
Glucose: 95 mg/dL (ref 65–99)
Potassium: 4.2 mmol/L (ref 3.5–5.2)
Sodium: 139 mmol/L (ref 134–144)

## 2019-03-10 ENCOUNTER — Other Ambulatory Visit: Payer: Self-pay | Admitting: Registered Nurse

## 2019-03-10 DIAGNOSIS — M1712 Unilateral primary osteoarthritis, left knee: Secondary | ICD-10-CM

## 2019-03-10 NOTE — Telephone Encounter (Signed)
Requested medication (s) are due for refill today: yes  Requested medication (s) are on the active medication list: yes  Last refill:  01/12/2019  Future visit scheduled: yes  Notes to clinic:  not delegated    Requested Prescriptions  Pending Prescriptions Disp Refills   traMADol (ULTRAM) 50 MG tablet [Pharmacy Med Name: traMADol HCl 50 MG Oral Tablet] 30 tablet 0    Sig: TAKE 1 TABLET BY MOUTH EVERY DAY AT BEDTIME AS NEEDED FOR PAIN      Not Delegated - Analgesics:  Opioid Agonists Failed - 03/10/2019  9:53 AM      Failed - This refill cannot be delegated      Failed - Urine Drug Screen completed in last 360 days.      Passed - Valid encounter within last 6 months    Recent Outpatient Visits           2 months ago QT prolongation   [facility], [place], NP   2 months ago Gastroesophageal reflux disease, unspecified whether esophagitis present   [facility], Delfino Lovett, NP   3 months ago Screening for hyperlipidemia   [facility], Delfino Lovett, NP   6 months ago Encounter to establish care   [facility], Delfino Lovett, NP   11 months ago Traumatic incomplete tear of right rotator cuff, initial encounter   [place], Rebekah Chesterfield, MD       Future Appointments             In 1 month Daune Perch, NP [place], [facility]

## 2019-03-10 NOTE — Telephone Encounter (Signed)
Not a patient at [facility] 

## 2019-03-10 NOTE — Telephone Encounter (Signed)
Requested medication (s) are due for refill today: yes  Requested medication (s) are on the active medication list: yes  Last refill:  01/12/2019  Future visit scheduled: yes  Notes to clinic:  not delegated   Requested Prescriptions  Pending Prescriptions Disp Refills   traMADol (ULTRAM) 50 MG tablet [Pharmacy Med Name: traMADol HCl 50 MG Oral Tablet] 30 tablet 0    Sig: TAKE 1 TABLET BY MOUTH EVERY DAY AT BEDTIME AS NEEDED FOR PAIN      Not Delegated - Analgesics:  Opioid Agonists Failed - 03/10/2019 11:33 AM      Failed - This refill cannot be delegated      Failed - Urine Drug Screen completed in last 360 days.      Passed - Valid encounter within last 6 months    Recent Outpatient Visits           2 months ago QT prolongation   [facility], [place], NP   2 months ago Gastroesophageal reflux disease, unspecified whether esophagitis present   [facility], Delfino Lovett, NP   3 months ago Screening for hyperlipidemia   [facility], Delfino Lovett, NP   6 months ago Encounter to establish care   [facility], Delfino Lovett, NP   11 months ago Traumatic incomplete tear of right rotator cuff, initial encounter   [place], Rebekah [place], MD       Future Appointments             In 1 month Daune Perch, NP [place], [facility]

## 2019-03-10 NOTE — Telephone Encounter (Signed)
Requested medication (s) are due for refill today: yes  Requested medication (s) are on the active medication list: yes  Last refill:  01/12/2019  Future visit scheduled: no  Notes to clinic:  not delegated    Requested Prescriptions  Pending Prescriptions Disp Refills   traMADol (ULTRAM) 50 MG tablet [Pharmacy Med Name: traMADol HCl 50 MG Oral Tablet] 30 tablet 0    Sig: TAKE 1 TABLET BY MOUTH EVERY DAY AT BEDTIME AS NEEDED FOR PAIN      Not Delegated - Analgesics:  Opioid Agonists Failed - 03/10/2019  9:40 AM      Failed - This refill cannot be delegated      Failed - Urine Drug Screen completed in last 360 days.      Passed - Valid encounter within last 6 months    Recent Outpatient Visits           2 months ago QT prolongation   [facility], [place], NP   2 months ago Gastroesophageal reflux disease, unspecified whether esophagitis present   [facility], Delfino Lovett, NP   3 months ago Screening for hyperlipidemia   [facility], Delfino Lovett, NP   6 months ago Encounter to establish care   [facility], Delfino Lovett, NP   11 months ago Traumatic incomplete tear of right rotator cuff, initial encounter   [place], Rebekah [place], MD       Future Appointments             In 1 month Daune Perch, NP [place]

## 2019-03-17 ENCOUNTER — Ambulatory Visit: Payer: Medicaid Other | Admitting: Cardiology

## 2019-04-14 ENCOUNTER — Encounter: Payer: Self-pay | Admitting: Cardiology

## 2019-04-14 ENCOUNTER — Ambulatory Visit (INDEPENDENT_AMBULATORY_CARE_PROVIDER_SITE_OTHER): Payer: No Typology Code available for payment source | Admitting: Cardiology

## 2019-04-14 ENCOUNTER — Other Ambulatory Visit: Payer: Self-pay

## 2019-04-14 VITALS — BP 134/76 | HR 88 | Ht 63.0 in | Wt 229.0 lb

## 2019-04-14 DIAGNOSIS — R9431 Abnormal electrocardiogram [ECG] [EKG]: Secondary | ICD-10-CM

## 2019-04-14 DIAGNOSIS — R0609 Other forms of dyspnea: Secondary | ICD-10-CM

## 2019-04-14 DIAGNOSIS — R0600 Dyspnea, unspecified: Secondary | ICD-10-CM | POA: Diagnosis not present

## 2019-04-14 DIAGNOSIS — R079 Chest pain, unspecified: Secondary | ICD-10-CM

## 2019-04-14 DIAGNOSIS — E876 Hypokalemia: Secondary | ICD-10-CM

## 2019-04-14 DIAGNOSIS — M25561 Pain in right knee: Secondary | ICD-10-CM

## 2019-04-14 DIAGNOSIS — I10 Essential (primary) hypertension: Secondary | ICD-10-CM | POA: Diagnosis not present

## 2019-04-14 DIAGNOSIS — Z6841 Body Mass Index (BMI) 40.0 and over, adult: Secondary | ICD-10-CM

## 2019-04-14 DIAGNOSIS — K219 Gastro-esophageal reflux disease without esophagitis: Secondary | ICD-10-CM

## 2019-04-14 MED ORDER — AMLODIPINE BESYLATE 5 MG PO TABS: 5.0000 mg | ORAL_TABLET | Freq: Every day | ORAL | 3 refills | Status: DC

## 2019-04-14 NOTE — Patient Instructions (Addendum)
Medication Instructions:  Start Amlodipine (Norvasc) 5 mg once a day   *If you need a refill on your cardiac medications before your next appointment, please call your pharmacy*  Lab Work: None ordered   If you have labs (blood work) drawn today and your tests are completely normal, you will receive your results only by: Marland Kitchen MyChart Message (if you have MyChart) OR . A paper copy in the mail If you have any lab test that is abnormal or we need to change your treatment, we will call you to review the results.  Testing/Procedures: Your physician has requested that you have a lexiscan myoview. For further information please visit [email or web address] Please follow instruction sheet, as given.    Follow-Up: At [facility], you and your health needs are our priority.  As part of our continuing mission to provide you with exceptional heart care, we have created designated Provider Care Teams.  These Care Teams include your primary Cardiologist (physician) and Advanced Practice Providers (APPs -  Physician Assistants and Nurse Practitioners) who all work together to provide you with the care you need, when you need it.  Your next appointment:   3 month(s)  The format for your next appointment:   In Person  Provider:   You may see Kristeen Miss, MD or one of the following Advanced Practice Providers on your designated Care Team:    Tereso Newcomer, PA-C  Vin [place], [place]  Berton Bon, NP   Other Instructions  Lifestyle Modifications to Prevent and Treat Heart Disease -Recommend heart healthy/Mediterranean diet, with whole grains, fruits, vegetables, fish, lean meats, nuts, olive oil and avocado oil.  -Limit salt intake to less than 2000 mg per day.  -Recommend moderate walking, starting slowly with a few minutes and working up to 3-5 times/week for 30-50 minutes each session. Aim for at least 150 minutes.week. Goal should be pace of 3 miles/hours, or walking 1.5 miles in 30  minutes -Recommend avoidance of tobacco products. Avoid excess alcohol. -Keep blood pressure well controlled, ideally less than 130/80.

## 2019-04-14 NOTE — Progress Notes (Signed)
Cardiology Office Note:    Date:  04/14/2019   ID:  [name], DOB Oct 13, 1955, MRN [id number]  PCP:  Janeece Agee, NP  Cardiologist:  No primary care provider on file.  Referring MD: Janeece Agee, NP   Chief Complaint  Patient presents with  . Shortness of Breath    History of Present Illness:    Virginia Cabrera is a 64 y.o. female with a past medical history significant for hypertension, asthma, arthritis, obesity, depression and anxiety.  Was referred to our office by Dr. Kateri Plummer for evaluation of prolonged QT interval and was initially seen by Dr. Elease Hashimoto on 02/07/2019.  It was noted that in October she had had an episode of severe leg cramps at [facility] and had presyncope.  She went to the ER and was found to have a very low potassium level and was having lots of PVCs.  The patient did not recall being told that she had torsades.  She saw her primary medical doctor on October 21 at which time her potassium was still 3.3 and QT interval was 418 with a QTC of 457.  Hydrochlorothiazide was discontinued and she was started on losartan.  When she saw Dr. Elease Hashimoto on 02/07/2019 she was remaining a little fatigued but had no further episodes of leg cramping or presyncope.  It was noted that she had a knee injury and would require surgery at some point.  Her blood pressure was elevated at the office visit and Dr. Elease Hashimoto suspected that she had high renin hypertension and he added spironolactone 25 mg daily.  Her QTC remained borderline elevated at 478 ms.  She was complaining of dyspnea on exertion and was noted to be fairly sedentary, with no exercise due to knee and back injury.  An echocardiogram was ordered for further evaluation of her cardiac function.  She was advised on weight loss.  Echocardiogram on 02/28/2019 showed normal LV systolic function with EF 60-65%, mild LVH and grade 1 diastolic dysfunction, no valvular abnormalities.  Follow-up labs on 02/28/2019 showed  slightly increased serum creatinine at 1.22.  Potassium was normal at 4.2.  The patient is here today for follow-up. She is still having dyspnea on exertion but this has been present since her teens. She reports asbestos exposure in her school age years.   She has made dietary changes with decrease in sodium content, reduced bread and pasta.  She has reduced her potato intake to about twice a week.  She has not started exercising yet due to knee issues. She has just been approved for disability.  She dose get chest pressure when she has bad anxiety and with her shortness of breath when she is walking fast or walking uphill. She sleeps on 2 pillows, feels like she can't breath if she lays flat. No edema.  She has some lightheadedness if she rises up too quick. No syncope since she had near syncope in [place].  She denies palpitations.  Her home BP's 140's-150/ 80's.   Cardiac studies   Echocardiogram 02/28/2019 1. Left ventricular ejection fraction, by visual estimation, is 60 to  65%. The left ventricle has normal function. There is mildly increased  left ventricular hypertrophy.  2. Left ventricular diastolic parameters are consistent with Grade I  diastolic dysfunction (impaired relaxation).  3. The left ventricle has no regional wall motion abnormalities.  4. Global right ventricle has normal systolic function.The right  ventricular size is normal. No increase in right ventricular wall  thickness.  5. Left atrial size was normal.  6. Right atrial size was normal.  7. The mitral valve is normal in structure. No evidence of mitral valve  regurgitation. No evidence of mitral stenosis.  8. The tricuspid valve is normal in structure. Tricuspid valve  regurgitation is not demonstrated.  9. The aortic valve is normal in structure. Aortic valve regurgitation is  trivial. No evidence of aortic valve sclerosis or stenosis.  10. The pulmonic valve was normal in structure.  Pulmonic valve  regurgitation is trivial.  11. The inferior vena cava is normal in size with greater than 50%  respiratory variability, suggesting right atrial pressure of 3 mmHg.   Past Medical History:  Diagnosis Date  . Allergy   . Anxiety   . Arthritis   . Asthma   . Depression   . Hypertension     Past Surgical History:  Procedure Laterality Date  . CESAREAN SECTION    . TONSILLECTOMY      Current Medications: Current Meds  Medication Sig  . albuterol (VENTOLIN HFA) 108 (90 Base) MCG/ACT inhaler INHALE 1 PUFF BY MOUTH EVERY 4 TO 6 HOURS AS NEEDED  . celecoxib (CELEBREX) 200 MG capsule TAKE 1 CAPSULE BY MOUTH ONCE DAILY FOR 30 DAYS  . escitalopram (LEXAPRO) 20 MG tablet Take 20 mg by mouth at bedtime.  Marland Kitchen esomeprazole (NEXIUM) 40 MG capsule Take 1 capsule (40 mg total) by mouth daily at 12 noon.  Marland Kitchen losartan (COZAAR) 100 MG tablet Take 1 tablet (100 mg total) by mouth daily.  Marland Kitchen spironolactone (ALDACTONE) 25 MG tablet Take 1 tablet (25 mg total) by mouth daily.  . sucralfate (CARAFATE) 1 g tablet Take 1 tablet (1 g total) by mouth 4 (four) times daily -  with meals and at bedtime.  . traMADol (ULTRAM) 50 MG tablet TAKE 1 TABLET BY MOUTH EVERY DAY AT BEDTIME AS NEEDED FOR PAIN  . traZODone (DESYREL) 100 MG tablet TK 1 T PO HS PRN     Allergies:   Morphine and Morphine and related   Social History   Socioeconomic History  . Marital status: Single    Spouse name: Not on file  . Number of children: 1  . Years of education: Not on file  . Highest education level: 12th grade  Occupational History  . Occupation: Groomer  Tobacco Use  . Smoking status: Former Smoker    Packs/day: 0.20    Years: 15.00    Pack years: 3.00    Types: Cigarettes    Start date: 08/24/1983    Quit date: 08/24/1998    Years since quitting: 20.6  . Smokeless tobacco: Never Used  Substance and Sexual Activity  . Alcohol use: Yes    Comment: occssionally  . Drug use: No  . Sexual activity:  Not on file  Other Topics Concern  . Not on file  Social History Narrative  . Not on file   Social Determinants of Health   Financial Resource Strain: Low Risk   . Difficulty of Paying Living Expenses: Not hard at all  Food Insecurity: No Food Insecurity  . Worried About Programme researcher, broadcasting/film/video in the Last Year: Never true  . Ran Out of Food in the Last Year: Never true  Transportation Needs: No Transportation Needs  . Lack of Transportation (Medical): No  . Lack of Transportation (Non-Medical): No  Physical Activity: Inactive  . Days of Exercise per Week: 0 days  . Minutes of Exercise per Session: 0 min  Stress: No Stress Concern Present  . Feeling of Stress : Not at all  Social Connections: Somewhat Isolated  . Frequency of Communication with Friends and Family: Three times a week  . Frequency of Social Gatherings with Friends and Family: Twice a week  . Attends Religious Services: 1 to 4 times per year  . Active Member of Clubs or Organizations: No  . Attends Archivist Meetings: Never  . Marital Status: Never married     Family History: The patient's family history includes Brain cancer in her father; Breast cancer in her maternal aunt and mother; Diabetes in her brother; Hypertension in her brother and sister; Kidney disease in her brother; Stroke in her maternal aunt. ROS:   Please see the history of present illness.     All other systems reviewed and are negative.   EKG:  EKG is not ordered today.    Recent Labs: 08/24/2018: Hemoglobin 14.1; Platelets 262 11/30/2018: ALT 43 12/29/2018: Magnesium 2.0 02/28/2019: BUN 16; Creatinine, Ser 1.22; Potassium 4.2; Sodium 139   Recent Lipid Panel    Component Value Date/Time   CHOL 203 (H) 11/30/2018 1447   TRIG 179 (H) 11/30/2018 1447   HDL 60 11/30/2018 1447   CHOLHDL 3.4 11/30/2018 1447   LDLCALC 112 (H) 11/30/2018 1447    Physical Exam:    VS:  BP 134/76   Pulse 88   Ht 5\' 3"  (1.6 m)   Wt 229 lb (103.9  kg)   SpO2 97%   BMI 40.57 kg/m     Wt Readings from Last 6 Encounters:  04/14/19 229 lb (103.9 kg)  02/07/19 227 lb 1.9 oz (103 kg)  12/29/18 226 lb (102.5 kg)  12/17/18 227 lb (103 kg)  11/30/18 226 lb (102.5 kg)  10/12/18 228 lb (103.4 kg)     Physical Exam  Constitutional: She is oriented to person, place, and time. She appears well-developed.  Obese female  HENT:  Head: Normocephalic and atraumatic.  Neck: No JVD present.  Cardiovascular: Normal rate, regular rhythm, normal heart sounds and intact distal pulses. Exam reveals no gallop and no friction rub.  No murmur heard. Pulmonary/Chest: Effort normal and breath sounds normal. No respiratory distress. She has no wheezes. She has no rales.  Abdominal: Soft. Bowel sounds are normal.  Musculoskeletal:        General: No edema. Normal range of motion.     Cervical back: Normal range of motion and neck supple.  Neurological: She is alert and oriented to person, place, and time.  Skin: Skin is warm and dry.  Psychiatric: She has a normal mood and affect. Her behavior is normal. Judgment and thought content normal.  Vitals reviewed.    ASSESSMENT:    1. Essential (primary) hypertension   2. Hypokalemia   3. Prolonged Q-T interval on ECG   4. DOE (dyspnea on exertion)   5. Chest pain, unspecified type    PLAN:    In order of problems listed above:  Dyspnea on exertion -Pt reports that she has had DOE since her teens.  She says she had asbestos exposure when she was a child and at some point was told she had some lung damage. -Echocardiogram showed normal LV systolic function with grade 1 diastolic dysfunction.  Normal valve function.  Does not explain her dyspnea on exertion.  Patient advised on increasing physical activity and body conditioning. -Symptoms could be in part related to obesity and/or pulmonary issues. -She also notes chest pressure  with exertion and when she is short of breath.  She would like further  evaluation.  I will order a Lexiscan Myoview. (She is concerned about the cost and if it turns out to be too expensive she may cancel) I feel that other issues are more likely the cause of her symptoms been cardiac issues. -If Myoview is normal, will consider referral to a pulmonologist.  Chest pressure -As above patient notes chest pressure with exertion and anxiety along with her shortness of breath. -CVD risk factors include hypertension and obesity. -We will order Myoview as above.  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  Hydrochlorothiazide was stopped and losartan initiated with later addition of spironolactone 25 mg. -BP running high. Will add amlodipine 5 mg and have pt follow up with home BPs.  We can adjust amlodipine up or down as needed.  Hypokalemia -Potassium had been down to 3.3.  Above medication changes were made. -Follow-up labs on 02/28/2019 showed potassium normal at 4.2.  Prolonged QTC -QTC noted to be long in the setting of hypokalemia -Most recent QTC 470 ms by EKG on 02/07/2019.  GERD -Takes Nexium and Carafate. She reports fairly well controlled. She did have some pain this morning.  -Likely her significant weight gain is contributing.  Right knee with arthritis and torn meniscus -Pt has no contraindication to surgery. She can proceed without any further cardiac testing.   Obesity, class III Body mass index is 40.57 kg/m. -Her activity has been significantly limited by knee issues. -She is trying to make dietary changes.  We discussed reducing her carbohydrate intake.   Medication Adjustments/Labs and Tests Ordered: Current medicines are reviewed at length with the patient today.  Concerns regarding medicines are outlined above. Labs and tests ordered and medication changes are outlined in the patient instructions below:  Patient Instructions  Medication Instructions:  Start Amlodipine (Norvasc) 5 mg once a day    *If you need a refill on your cardiac medications before your next appointment, please call your pharmacy*  Lab Work: None ordered   If you have labs (blood work) drawn today and your tests are completely normal, you will receive your results only by: Marland Kitchen MyChart Message (if you have MyChart) OR . A paper copy in the mail If you have any lab test that is abnormal or we need to change your treatment, we will call you to review the results.  Testing/Procedures: Your physician has requested that you have a lexiscan myoview. For further information please visit [email or web address] Please follow instruction sheet, as given.    Follow-Up: At [facility], you and your health needs are our priority.  As part of our continuing mission to provide you with exceptional heart care, we have created designated Provider Care Teams.  These Care Teams include your primary Cardiologist (physician) and Advanced Practice Providers (APPs -  Physician Assistants and Nurse Practitioners) who all work together to provide you with the care you need, when you need it.  Your next appointment:   3 month(s)  The format for your next appointment:   In Person  Provider:   You may see Kristeen Miss, MD or one of the following Advanced Practice Providers on your designated Care Team:    Tereso Newcomer, PA-C  Vin [place], [place]  Berton Bon, NP   Other Instructions  Lifestyle Modifications to Prevent and Treat Heart Disease -Recommend heart healthy/Mediterranean diet, with whole grains, fruits, vegetables, fish, lean meats, nuts, olive oil and avocado  oil.  -Limit salt intake to less than 2000 mg per day.  -Recommend moderate walking, starting slowly with a few minutes and working up to 3-5 times/week for 30-50 minutes each session. Aim for at least 150 minutes.week. Goal should be pace of 3 miles/hours, or walking 1.5 miles in 30 minutes -Recommend avoidance of tobacco products. Avoid excess  alcohol. -Keep blood pressure well controlled, ideally less than 130/80.      Signed, Berton Bon, NP  04/14/2019 2:51 PM    [facility]

## 2019-05-03 ENCOUNTER — Telehealth (HOSPITAL_COMMUNITY): Payer: Self-pay | Admitting: *Deleted

## 2019-05-03 NOTE — Telephone Encounter (Signed)
Left message on voicemail per DPR in reference to upcoming appointment scheduled on 05/06/19 at 10:15 with detailed instructions given per Myocardial Perfusion Study Information Sheet for the test. LM to arrive 15 minutes early, and that it is imperative to arrive on time for appointment to keep from having the test rescheduled. If you need to cancel or reschedule your appointment, please call the office within 24 hours of your appointment. Failure to do so may result in a cancellation of your appointment, and a $50 no show fee. Phone number given for call back for any questions.

## 2019-05-06 ENCOUNTER — Encounter (HOSPITAL_COMMUNITY): Payer: No Typology Code available for payment source

## 2019-05-21 ENCOUNTER — Other Ambulatory Visit: Payer: Self-pay | Admitting: Registered Nurse

## 2019-05-21 DIAGNOSIS — M1712 Unilateral primary osteoarthritis, left knee: Secondary | ICD-10-CM

## 2019-05-23 NOTE — Telephone Encounter (Signed)
Patient is requesting a refill of the following medications: Requested Prescriptions   Pending Prescriptions Disp Refills  . traMADol (ULTRAM) 50 MG tablet [Pharmacy Med Name: traMADol HCl 50 MG Oral Tablet] 30 tablet 0    Sig: TAKE 1 TABLET BY MOUTH EVERY DAY AT BEDTIME AS NEEDED FOR PAIN    Date of patient request:05/21/2019 Last office visit: 12/29/2019 Date of last refill: 03/14/2019 Last refill amount: 30 tablets Follow up time period per chart: No follow up appointment scheduled

## 2019-07-05 ENCOUNTER — Ambulatory Visit (INDEPENDENT_AMBULATORY_CARE_PROVIDER_SITE_OTHER): Payer: No Typology Code available for payment source | Admitting: Registered Nurse

## 2019-07-05 ENCOUNTER — Other Ambulatory Visit: Payer: Self-pay

## 2019-07-05 ENCOUNTER — Encounter: Payer: Self-pay | Admitting: Registered Nurse

## 2019-07-05 VITALS — BP 125/79 | HR 84 | Temp 98.0°F | Resp 16 | Ht 63.0 in | Wt 227.2 lb

## 2019-07-05 DIAGNOSIS — Z13228 Encounter for screening for other metabolic disorders: Secondary | ICD-10-CM

## 2019-07-05 DIAGNOSIS — Z8639 Personal history of other endocrine, nutritional and metabolic disease: Secondary | ICD-10-CM | POA: Diagnosis not present

## 2019-07-05 DIAGNOSIS — R0602 Shortness of breath: Secondary | ICD-10-CM

## 2019-07-05 DIAGNOSIS — Z1159 Encounter for screening for other viral diseases: Secondary | ICD-10-CM

## 2019-07-05 DIAGNOSIS — R0681 Apnea, not elsewhere classified: Secondary | ICD-10-CM | POA: Diagnosis not present

## 2019-07-05 DIAGNOSIS — R42 Dizziness and giddiness: Secondary | ICD-10-CM | POA: Diagnosis not present

## 2019-07-05 DIAGNOSIS — Z13220 Encounter for screening for lipoid disorders: Secondary | ICD-10-CM

## 2019-07-05 DIAGNOSIS — Z130 Encounter for screening for diseases of the blood and blood-forming organs and certain disorders involving the immune mechanism: Secondary | ICD-10-CM

## 2019-07-05 DIAGNOSIS — M1712 Unilateral primary osteoarthritis, left knee: Secondary | ICD-10-CM

## 2019-07-05 DIAGNOSIS — Z1329 Encounter for screening for other suspected endocrine disorder: Secondary | ICD-10-CM

## 2019-07-05 MED ORDER — BUDESONIDE-FORMOTEROL FUMARATE 160-4.5 MCG/ACT IN AERO: 2.0000 | INHALATION_SPRAY | Freq: Two times a day (BID) | RESPIRATORY_TRACT | 3 refills | Status: DC

## 2019-07-05 MED ORDER — MECLIZINE HCL 25 MG PO TABS: 25.0000 mg | ORAL_TABLET | Freq: Three times a day (TID) | ORAL | 0 refills | Status: DC | PRN

## 2019-07-05 MED ORDER — TRAMADOL HCL 50 MG PO TABS: 50.0000 mg | ORAL_TABLET | Freq: Two times a day (BID) | ORAL | 0 refills | Status: DC

## 2019-07-05 NOTE — Progress Notes (Signed)
Established Patient Office Visit  Subjective:  Patient ID: Virginia Cabrera, female    DOB: 1955/10/13  Age: 64 y.o. MRN: [id number]  CC:  Chief Complaint  Patient presents with  . Follow-up    6 month follow up patient would like to discuss her potassium. she states she was out of town and started to feel dizzy she couldnt even stand up    HPI Virginia Cabrera presents for 6 mo follow up for htn Feeling well overall, but continues to have intermittent episodes of dizziness. These often happen when rapidly changing position. Of note, she also had one instance of extreme dizziness, nausea, and vomiting not dissimilar to her initial episode of concern. This too happened while she had been asleep. She reports she rose rapidly to use the rest room, felt immediately dizzy, returned to a supine position, felt nauseous, and ended up vomiting. She took an extra dose of spironolactone and felt relief within about 20-30 minutes. She was able to go back to bed and felt much improved the next day. Of note, she feels she has been waking with dry mouth more frequently, waking frequently while sleeping, has had witnessed apnea, daytime fatigue, and recent rapid weight gain.   She has been followed by cardiology who has given her clearance, they suspect a pulmonology based etiology for her symptoms.   Past Medical History:  Diagnosis Date  . Allergy   . Anxiety   . Arthritis   . Asthma   . Depression   . Hypertension     Past Surgical History:  Procedure Laterality Date  . CESAREAN SECTION    . TONSILLECTOMY      Family History  Problem Relation Age of Onset  . Breast cancer Mother   . Brain cancer Father   . Breast cancer Maternal Aunt   . Stroke Maternal Aunt   . Hypertension Sister   . Diabetes Brother   . Kidney disease Brother   . Hypertension Brother     Social History   Socioeconomic History  . Marital status: Single    Spouse name: Not on file  . Number of children: 1    . Years of education: Not on file  . Highest education level: 12th grade  Occupational History  . Occupation: Groomer  Tobacco Use  . Smoking status: Former Smoker    Packs/day: 0.20    Years: 15.00    Pack years: 3.00    Types: Cigarettes    Start date: 08/24/1983    Quit date: 08/24/1998    Years since quitting: 20.8  . Smokeless tobacco: Never Used  Substance and Sexual Activity  . Alcohol use: Yes    Comment: occssionally  . Drug use: No  . Sexual activity: Not on file  Other Topics Concern  . Not on file  Social History Narrative  . Not on file   Social Determinants of Health   Financial Resource Strain: Low Risk   . Difficulty of Paying Living Expenses: Not hard at all  Food Insecurity: No Food Insecurity  . Worried About Charity fundraiser in the Last Year: Never true  . Ran Out of Food in the Last Year: Never true  Transportation Needs: No Transportation Needs  . Lack of Transportation (Medical): No  . Lack of Transportation (Non-Medical): No  Physical Activity: Inactive  . Days of Exercise per Week: 0 days  . Minutes of Exercise per Session: 0 min  Stress: No Stress Concern Present  .  Feeling of Stress : Not at all  Social Connections: Somewhat Isolated  . Frequency of Communication with Friends and Family: Three times a week  . Frequency of Social Gatherings with Friends and Family: Twice a week  . Attends Religious Services: 1 to 4 times per year  . Active Member of Clubs or Organizations: No  . Attends Banker Meetings: Never  . Marital Status: Never married  Intimate Partner Violence: Not At Risk  . Fear of Current or Ex-Partner: No  . Emotionally Abused: No  . Physically Abused: No  . Sexually Abused: No    Outpatient Medications Prior to Visit  Medication Sig Dispense Refill  . albuterol (VENTOLIN HFA) 108 (90 Base) MCG/ACT inhaler INHALE 1 PUFF BY MOUTH EVERY 4 TO 6 HOURS AS NEEDED 18 g 2  . amLODipine (NORVASC) 5 MG tablet Take 1  tablet (5 mg total) by mouth daily. 90 tablet 3  . celecoxib (CELEBREX) 200 MG capsule TAKE 1 CAPSULE BY MOUTH ONCE DAILY FOR 30 DAYS    . escitalopram (LEXAPRO) 20 MG tablet Take 20 mg by mouth at bedtime.    Marland Kitchen esomeprazole (NEXIUM) 40 MG capsule Take 1 capsule (40 mg total) by mouth daily at 12 noon. 28 capsule 2  . losartan (COZAAR) 100 MG tablet Take 1 tablet (100 mg total) by mouth daily. 90 tablet 3  . spironolactone (ALDACTONE) 25 MG tablet Take 1 tablet (25 mg total) by mouth daily. 30 tablet 11  . sucralfate (CARAFATE) 1 g tablet Take 1 tablet (1 g total) by mouth 4 (four) times daily -  with meals and at bedtime. 56 tablet 1  . traZODone (DESYREL) 100 MG tablet TK 1 T PO HS PRN    . traMADol (ULTRAM) 50 MG tablet TAKE 1 TABLET BY MOUTH EVERY DAY AT BEDTIME AS NEEDED FOR PAIN 30 tablet 0   No facility-administered medications prior to visit.    Allergies  Allergen Reactions  . Morphine Other (See Comments)    Causes "coma"  . Morphine And Related     ROS Review of Systems Per hpi     Objective:    Physical Exam  Constitutional: She is oriented to person, place, and time.  Cardiovascular: Normal rate, regular rhythm and normal heart sounds. Exam reveals no gallop and no friction rub.  No murmur heard. Pulmonary/Chest: Effort normal and breath sounds normal. No respiratory distress. She has no wheezes. She has no rales. She exhibits no tenderness.  Neurological: She is alert and oriented to person, place, and time. She has normal strength. She displays no atrophy and no tremor. No cranial nerve deficit or sensory deficit. She exhibits normal muscle tone. She displays a negative Romberg sign. Coordination and gait normal. GCS eye subscore is 4. GCS verbal subscore is 5. GCS motor subscore is 6.  Skin: Skin is warm and dry. No rash noted. No erythema. No pallor.  Psychiatric: She has a normal mood and affect. Her behavior is normal. Judgment and thought content normal.    Nursing note and vitals reviewed.   BP 125/79   Pulse 84   Temp 98 F (36.7 C) (Temporal)   Resp 16   Ht 5\' 3"  (1.6 m)   Wt 227 lb 3.2 oz (103.1 kg)   SpO2 98%   BMI 40.25 kg/m  Wt Readings from Last 3 Encounters:  07/05/19 227 lb 3.2 oz (103.1 kg)  04/14/19 229 lb (103.9 kg)  02/07/19 227 lb 1.9 oz (103  kg)     Health Maintenance Due  Topic Date Due  . Hepatitis C Screening  Never done  . HIV Screening  Never done  . COVID-19 Vaccine (1) Never done    There are no preventive care reminders to display for this patient.  No results found for: TSH Lab Results  Component Value Date   WBC 6.9 08/24/2018   HGB 14.1 08/24/2018   HCT 41.1 08/24/2018   MCV 91 08/24/2018   PLT 262 08/24/2018   Lab Results  Component Value Date   NA 139 02/28/2019   K 4.2 02/28/2019   CO2 23 02/28/2019   GLUCOSE 95 02/28/2019   BUN 16 02/28/2019   CREATININE 1.22 (H) 02/28/2019   BILITOT 0.4 11/30/2018   ALKPHOS 81 11/30/2018   AST 41 (H) 11/30/2018   ALT 43 (H) 11/30/2018   PROT 7.5 11/30/2018   ALBUMIN 4.6 11/30/2018   CALCIUM 9.8 02/28/2019   Lab Results  Component Value Date   CHOL 203 (H) 11/30/2018   Lab Results  Component Value Date   HDL 60 11/30/2018   Lab Results  Component Value Date   LDLCALC 112 (H) 11/30/2018   Lab Results  Component Value Date   TRIG 179 (H) 11/30/2018   Lab Results  Component Value Date   CHOLHDL 3.4 11/30/2018   Lab Results  Component Value Date   HGBA1C 5.6 08/24/2018      Assessment & Plan:   Problem List Items Addressed This Visit    None    Visit Diagnoses    History of hypokalemia    -  Primary   Relevant Orders   Comprehensive metabolic panel   Lipid screening       Relevant Orders   Lipid panel   Screening for endocrine, metabolic and immunity disorder       Relevant Orders   TSH   CBC with Differential   Hemoglobin A1c   Screening for viral disease       Relevant Orders   Hepatitis C antibody   HIV  antibody (with reflex)   Shortness of breath       Relevant Medications   budesonide-formoterol (SYMBICORT) 160-4.5 MCG/ACT inhaler   Witnessed episode of apnea       Relevant Orders   Ambulatory referral to Pulmonology   Dizziness       Relevant Medications   meclizine (ANTIVERT) 25 MG tablet   Osteoarthritis of left knee, unspecified osteoarthritis type       Relevant Medications   traMADol (ULTRAM) 50 MG tablet      Meds ordered this encounter  Medications  . budesonide-formoterol (SYMBICORT) 160-4.5 MCG/ACT inhaler    Sig: Inhale 2 puffs into the lungs 2 (two) times daily.    Dispense:  1 Inhaler    Refill:  3    Order Specific Question:   Supervising Provider    Answer:   Collie Siad A [id number]  . meclizine (ANTIVERT) 25 MG tablet    Sig: Take 1 tablet (25 mg total) by mouth 3 (three) times daily as needed for dizziness.    Dispense:  30 tablet    Refill:  0    Order Specific Question:   Supervising Provider    Answer:   Collie Siad A [id number]  . traMADol (ULTRAM) 50 MG tablet    Sig: Take 1 tablet (50 mg total) by mouth 2 (two) times daily.    Dispense:  60 tablet    Refill:  0    Order Specific Question:   Supervising Provider    Answer:   Doristine Bosworth [[id number]]    Follow-up: No follow-ups on file.   PLAN  Neuro testing unremarkable. Will try meclizine for potential vertigo. Use 25mg  PO tid prn   Will send symbicort to address potential breathing issues that albuterol may not be addressing - this will hopefully start to address and pulm etiology for her concerns  Referral sent for sleep study - with recent weight gain, may be having nocturnal HTN d/t apnea that is causing her symptoms or at least relating to orthostatic changes when she wakes and changes position  She will continue to follow with cards  Labs collected, will follow up as warranted  Patient encouraged to call clinic with any questions, comments, or concerns.  , NP

## 2019-07-05 NOTE — Patient Instructions (Signed)
° ° ° °  If you have lab work done today you will be contacted with your lab results within the next 2 weeks.  If you have not heard from us then please contact us. The fastest way to get your results is to register for My Chart. ° ° °IF you received an x-ray today, you will receive an invoice from [facility]. Please contact [facility] at [phone] with questions or concerns regarding your invoice.  ° °IF you received labwork today, you will receive an invoice from [facility]. Please contact [facility] at [phone] with questions or concerns regarding your invoice.  ° °Our billing staff will not be able to assist you with questions regarding bills from these companies. ° °You will be contacted with the lab results as soon as they are available. The fastest way to get your results is to activate your My Chart account. Instructions are located on the last page of this paperwork. If you have not heard from us regarding the results in 2 weeks, please contact this office. °  ° ° ° °

## 2019-07-06 LAB — LIPID PANEL
Chol/HDL Ratio: 4.2 ratio (ref 0.0–4.4)
Cholesterol, Total: 204 mg/dL — ABNORMAL HIGH (ref 100–199)
HDL: 49 mg/dL (ref >39)
LDL Chol Calc (NIH): 113 mg/dL — ABNORMAL HIGH (ref 0–99)
Triglycerides: 245 mg/dL — ABNORMAL HIGH (ref 0–149)
VLDL Cholesterol Cal: 42 mg/dL — ABNORMAL HIGH (ref 5–40)

## 2019-07-06 LAB — COMPREHENSIVE METABOLIC PANEL
ALT: 32 IU/L (ref 0–32)
AST: 30 IU/L (ref 0–40)
Albumin/Globulin Ratio: 1.5 (ref 1.2–2.2)
Albumin: 4.6 g/dL (ref 3.8–4.8)
Alkaline Phosphatase: 75 IU/L (ref 39–117)
BUN/Creatinine Ratio: 14 (ref 12–28)
BUN: 16 mg/dL (ref 8–27)
Bilirubin Total: 0.4 mg/dL (ref 0.0–1.2)
CO2: 22 mmol/L (ref 20–29)
Calcium: 9.9 mg/dL (ref 8.7–10.3)
Chloride: 102 mmol/L (ref 96–106)
Creatinine, Ser: 1.15 mg/dL — ABNORMAL HIGH (ref 0.57–1.00)
GFR calc Af Amer: 59 mL/min/{1.73_m2} — ABNORMAL LOW (ref >59)
GFR calc non Af Amer: 51 mL/min/{1.73_m2} — ABNORMAL LOW (ref >59)
Globulin, Total: 3.1 g/dL (ref 1.5–4.5)
Glucose: 87 mg/dL (ref 65–99)
Potassium: 4.7 mmol/L (ref 3.5–5.2)
Sodium: 139 mmol/L (ref 134–144)
Total Protein: 7.7 g/dL (ref 6.0–8.5)

## 2019-07-06 LAB — HEPATITIS C ANTIBODY: Hep C Virus Ab: <0.1 s/co ratio (ref 0.0–0.9)

## 2019-07-06 LAB — HIV ANTIBODY (ROUTINE TESTING W REFLEX): HIV Screen 4th Generation wRfx: NONREACTIVE

## 2019-07-06 LAB — CBC WITH DIFFERENTIAL/PLATELET
Basophils Absolute: 0 10*3/uL (ref 0.0–0.2)
Basos: 1 %
EOS (ABSOLUTE): 0.1 10*3/uL (ref 0.0–0.4)
Eos: 1 %
Hematocrit: 41.7 % (ref 34.0–46.6)
Hemoglobin: 14.4 g/dL (ref 11.1–15.9)
Immature Grans (Abs): 0 10*3/uL (ref 0.0–0.1)
Immature Granulocytes: 0 %
Lymphocytes Absolute: 2.1 10*3/uL (ref 0.7–3.1)
Lymphs: 30 %
MCH: 30.9 pg (ref 26.6–33.0)
MCHC: 34.5 g/dL (ref 31.5–35.7)
MCV: 90 fL (ref 79–97)
Monocytes Absolute: 0.6 10*3/uL (ref 0.1–0.9)
Monocytes: 8 %
Neutrophils Absolute: 4.1 10*3/uL (ref 1.4–7.0)
Neutrophils: 60 %
Platelets: 267 10*3/uL (ref 150–450)
RBC: 4.66 x10E6/uL (ref 3.77–5.28)
RDW: 12 % (ref 11.7–15.4)
WBC: 6.9 10*3/uL (ref 3.4–10.8)

## 2019-07-06 LAB — HEMOGLOBIN A1C
Est. average glucose Bld gHb Est-mCnc: 111 mg/dL
Hgb A1c MFr Bld: 5.5 % (ref 4.8–5.6)

## 2019-07-06 LAB — TSH: TSH: 1.01 u[IU]/mL (ref 0.450–4.500)

## 2019-07-06 NOTE — Progress Notes (Signed)
Good morning,  If we could send Ms.Zale a letter - labs are steady, no urgent concerns.  Thank you!  Jari Sportsman, NP

## 2019-07-12 ENCOUNTER — Ambulatory Visit: Payer: No Typology Code available for payment source | Admitting: Cardiovascular Disease

## 2019-09-05 ENCOUNTER — Other Ambulatory Visit: Payer: Self-pay | Admitting: Registered Nurse

## 2019-09-05 DIAGNOSIS — M1712 Unilateral primary osteoarthritis, left knee: Secondary | ICD-10-CM

## 2019-09-05 NOTE — Telephone Encounter (Signed)
Patient is requesting a refill of the following medications: Requested Prescriptions   Pending Prescriptions Disp Refills   traMADol (ULTRAM) 50 MG tablet [Pharmacy Med Name: traMADol HCl 50 MG Oral Tablet] 60 tablet 0    Sig: Take 1 tablet by mouth twice daily    Date of patient request: 09/05/19 Last office visit: 07/05/19 Date of last refill: 07/05/19 Last refill amount: 60 0rf Follow up time period per chart:01/03/20

## 2019-10-10 ENCOUNTER — Ambulatory Visit (INDEPENDENT_AMBULATORY_CARE_PROVIDER_SITE_OTHER): Payer: Self-pay | Admitting: Registered Nurse

## 2019-10-10 ENCOUNTER — Other Ambulatory Visit: Payer: Self-pay

## 2019-10-10 ENCOUNTER — Encounter: Payer: Self-pay | Admitting: Registered Nurse

## 2019-10-10 VITALS — BP 142/79 | HR 90 | Temp 98.0°F | Resp 18 | Ht 63.0 in | Wt 227.8 lb

## 2019-10-10 DIAGNOSIS — R252 Cramp and spasm: Secondary | ICD-10-CM

## 2019-10-10 DIAGNOSIS — K219 Gastro-esophageal reflux disease without esophagitis: Secondary | ICD-10-CM

## 2019-10-10 MED ORDER — ESOMEPRAZOLE MAGNESIUM 40 MG PO CPDR: 40.0000 mg | DELAYED_RELEASE_CAPSULE | Freq: Every day | ORAL | 3 refills | Status: DC

## 2019-10-10 MED ORDER — HYDROXYZINE HCL 25 MG PO TABS: 25.0000 mg | ORAL_TABLET | Freq: Three times a day (TID) | ORAL | 0 refills | Status: DC | PRN

## 2019-10-10 NOTE — Patient Instructions (Signed)
° ° ° °  If you have lab work done today you will be contacted with your lab results within the next 2 weeks.  If you have not heard from us then please contact us. The fastest way to get your results is to register for My Chart. ° ° °IF you received an x-ray today, you will receive an invoice from [facility]. Please contact [facility] at [phone] with questions or concerns regarding your invoice.  ° °IF you received labwork today, you will receive an invoice from [facility]. Please contact [facility] at [phone] with questions or concerns regarding your invoice.  ° °Our billing staff will not be able to assist you with questions regarding bills from these companies. ° °You will be contacted with the lab results as soon as they are available. The fastest way to get your results is to activate your My Chart account. Instructions are located on the last page of this paperwork. If you have not heard from us regarding the results in 2 weeks, please contact this office. °  ° ° ° °

## 2019-10-10 NOTE — Progress Notes (Signed)
Established Patient Office Visit  Subjective:  Patient ID: Virginia Cabrera, female    DOB: 1955/04/04  Age: 64 y.o. MRN: [id number]  CC:  Chief Complaint  Patient presents with  . Leg Pain    Patient states she has been having some servere cramps under the breast area , inner thighs and also lower legs that seems to be getting worse.    HPI Virginia Cabrera presents for cramping  Ongoing. Most nights. Getting worse. Cramping in legs and under breasts. Only at night. No changes to meds, diet, exercise. Does have more stress lately d/t family member's adverse health event. No clear cause and nothing seems to improve these besides stopping movement.  Not taking OTCs or other supplements at this time.  Did have one night of red raised and itchy rash across arms and legs. Felt flushed. No other symptoms: denies swelling, shob, doe, chest pain, visual changes, sweating, nvd, and any other concerns  Past Medical History:  Diagnosis Date  . Allergy   . Anxiety   . Arthritis   . Asthma   . Depression   . Hypertension     Past Surgical History:  Procedure Laterality Date  . CESAREAN SECTION    . TONSILLECTOMY      Family History  Problem Relation Age of Onset  . Breast cancer Mother   . Brain cancer Father   . Breast cancer Maternal Aunt   . Stroke Maternal Aunt   . Hypertension Sister   . Diabetes Brother   . Kidney disease Brother   . Hypertension Brother     Social History   Socioeconomic History  . Marital status: Single    Spouse name: Not on file  . Number of children: 1  . Years of education: Not on file  . Highest education level: 12th grade  Occupational History  . Occupation: Groomer  Tobacco Use  . Smoking status: Former Smoker    Packs/day: 0.20    Years: 15.00    Pack years: 3.00    Types: Cigarettes    Start date: 08/24/1983    Quit date: 08/24/1998    Years since quitting: 21.1  . Smokeless tobacco: Never Used  Vaping Use  . Vaping Use:  Never used  Substance and Sexual Activity  . Alcohol use: Yes    Comment: occssionally  . Drug use: No  . Sexual activity: Not on file  Other Topics Concern  . Not on file  Social History Narrative  . Not on file   Social Determinants of Health   Financial Resource Strain:   . Difficulty of Paying Living Expenses:   Food Insecurity:   . Worried About Programme researcher, broadcasting/film/video in the Last Year:   . Barista in the Last Year:   Transportation Needs:   . Freight forwarder (Medical):   Marland Kitchen Lack of Transportation (Non-Medical):   Physical Activity:   . Days of Exercise per Week:   . Minutes of Exercise per Session:   Stress:   . Feeling of Stress :   Social Connections:   . Frequency of Communication with Friends and Family:   . Frequency of Social Gatherings with Friends and Family:   . Attends Religious Services:   . Active Member of Clubs or Organizations:   . Attends Banker Meetings:   Marland Kitchen Marital Status:   Intimate Partner Violence:   . Fear of Current or Ex-Partner:   . Emotionally Abused:   .  Physically Abused:   . Sexually Abused:     Outpatient Medications Prior to Visit  Medication Sig Dispense Refill  . albuterol (VENTOLIN HFA) 108 (90 Base) MCG/ACT inhaler INHALE 1 PUFF BY MOUTH EVERY 4 TO 6 HOURS AS NEEDED 18 g 2  . amLODipine (NORVASC) 5 MG tablet Take 1 tablet (5 mg total) by mouth daily. 90 tablet 3  . budesonide-formoterol (SYMBICORT) 160-4.5 MCG/ACT inhaler Inhale 2 puffs into the lungs 2 (two) times daily. 1 Inhaler 3  . celecoxib (CELEBREX) 200 MG capsule TAKE 1 CAPSULE BY MOUTH ONCE DAILY FOR 30 DAYS    . escitalopram (LEXAPRO) 20 MG tablet Take 20 mg by mouth at bedtime.    Marland Kitchen losartan (COZAAR) 100 MG tablet Take 1 tablet (100 mg total) by mouth daily. 90 tablet 3  . meclizine (ANTIVERT) 25 MG tablet Take 1 tablet (25 mg total) by mouth 3 (three) times daily as needed for dizziness. 30 tablet 0  . spironolactone (ALDACTONE) 25 MG tablet  Take 1 tablet (25 mg total) by mouth daily. 30 tablet 11  . sucralfate (CARAFATE) 1 g tablet Take 1 tablet (1 g total) by mouth 4 (four) times daily -  with meals and at bedtime. 56 tablet 1  . traMADol (ULTRAM) 50 MG tablet Take 1 tablet by mouth twice daily 60 tablet 0  . traZODone (DESYREL) 100 MG tablet TK 1 T PO HS PRN    . esomeprazole (NEXIUM) 40 MG capsule Take 1 capsule (40 mg total) by mouth daily at 12 noon. 28 capsule 2   No facility-administered medications prior to visit.    Allergies  Allergen Reactions  . Morphine Other (See Comments)    Causes "coma"  . Morphine And Related     ROS Review of Systems  Constitutional: Negative.   HENT: Negative.   Eyes: Negative.   Respiratory: Negative.   Cardiovascular: Negative.   Gastrointestinal: Negative.   Endocrine: Negative.   Genitourinary: Negative.   Musculoskeletal: Positive for myalgias. Negative for arthralgias, back pain, gait problem, joint swelling, neck pain and neck stiffness.  Skin: Negative.   Allergic/Immunologic: Negative.   Neurological: Negative.   Hematological: Negative.   Psychiatric/Behavioral: Negative.   All other systems reviewed and are negative.     Objective:    Physical Exam Vitals and nursing note reviewed.  Constitutional:      General: She is not in acute distress.    Appearance: Normal appearance. She is obese. She is not ill-appearing, toxic-appearing or diaphoretic.  Cardiovascular:     Rate and Rhythm: Normal rate and regular rhythm.     Pulses: Normal pulses.     Heart sounds: Normal heart sounds.  Pulmonary:     Effort: Pulmonary effort is normal. No respiratory distress.  Neurological:     General: No focal deficit present.     Mental Status: She is alert and oriented to person, place, and time. Mental status is at baseline.  Psychiatric:        Mood and Affect: Mood normal.        Behavior: Behavior normal.        Thought Content: Thought content normal.         Judgment: Judgment normal.     BP (!) 142/79   Pulse 90   Temp 98 F (36.7 C) (Temporal)   Resp 18   Ht 5\' 3"  (1.6 m)   Wt 227 lb 12.8 oz (103.3 kg)   SpO2 98%   BMI  40.35 kg/m  Wt Readings from Last 3 Encounters:  10/10/19 227 lb 12.8 oz (103.3 kg)  07/05/19 227 lb 3.2 oz (103.1 kg)  04/14/19 229 lb (103.9 kg)     Health Maintenance Due  Topic Date Due  . INFLUENZA VACCINE  10/09/2019    There are no preventive care reminders to display for this patient.  Lab Results  Component Value Date   TSH 1.010 07/05/2019   Lab Results  Component Value Date   WBC 6.9 07/05/2019   HGB 14.4 07/05/2019   HCT 41.7 07/05/2019   MCV 90 07/05/2019   PLT 267 07/05/2019   Lab Results  Component Value Date   NA 139 07/05/2019   K 4.7 07/05/2019   CO2 22 07/05/2019   GLUCOSE 87 07/05/2019   BUN 16 07/05/2019   CREATININE 1.15 (H) 07/05/2019   BILITOT 0.4 07/05/2019   ALKPHOS 75 07/05/2019   AST 30 07/05/2019   ALT 32 07/05/2019   PROT 7.7 07/05/2019   ALBUMIN 4.6 07/05/2019   CALCIUM 9.9 07/05/2019   Lab Results  Component Value Date   CHOL 204 (H) 07/05/2019   Lab Results  Component Value Date   HDL 49 07/05/2019   Lab Results  Component Value Date   LDLCALC 113 (H) 07/05/2019   Lab Results  Component Value Date   TRIG 245 (H) 07/05/2019   Lab Results  Component Value Date   CHOLHDL 4.2 07/05/2019   Lab Results  Component Value Date   HGBA1C 5.5 07/05/2019      Assessment & Plan:   Problem List Items Addressed This Visit      Digestive   Gastroesophageal reflux disease   Relevant Medications   esomeprazole (NEXIUM) 40 MG capsule    Other Visit Diagnoses    Leg cramping    -  Primary   Relevant Orders   Magnesium   Comprehensive metabolic panel   Sedimentation Rate   Vitamin D, 25-hydroxy      Meds ordered this encounter  Medications  . esomeprazole (NEXIUM) 40 MG capsule    Sig: Take 1 capsule (40 mg total) by mouth daily at 12 noon.     Dispense:  90 capsule    Refill:  3    Follow-up: No follow-ups on file.   PLAN  Take nightly as needed hydroxyzine for rash, may provide some benefit for cramping  Will draw labs for further investigation - pt does not have insurance.  Patient encouraged to call clinic with any questions, comments, or concerns.  Janeece Agee, NP

## 2019-10-11 ENCOUNTER — Other Ambulatory Visit: Payer: Self-pay | Admitting: Registered Nurse

## 2019-10-11 DIAGNOSIS — E559 Vitamin D deficiency, unspecified: Secondary | ICD-10-CM

## 2019-10-11 LAB — COMPREHENSIVE METABOLIC PANEL
ALT: 29 IU/L (ref 0–32)
AST: 28 IU/L (ref 0–40)
Albumin/Globulin Ratio: 1.6 (ref 1.2–2.2)
Albumin: 4.5 g/dL (ref 3.8–4.8)
Alkaline Phosphatase: 77 IU/L (ref 48–121)
BUN/Creatinine Ratio: 14 (ref 12–28)
BUN: 16 mg/dL (ref 8–27)
Bilirubin Total: 0.4 mg/dL (ref 0.0–1.2)
CO2: 24 mmol/L (ref 20–29)
Calcium: 10 mg/dL (ref 8.7–10.3)
Chloride: 100 mmol/L (ref 96–106)
Creatinine, Ser: 1.16 mg/dL — ABNORMAL HIGH (ref 0.57–1.00)
GFR calc Af Amer: 59 mL/min/{1.73_m2} — ABNORMAL LOW (ref >59)
GFR calc non Af Amer: 51 mL/min/{1.73_m2} — ABNORMAL LOW (ref >59)
Globulin, Total: 2.9 g/dL (ref 1.5–4.5)
Glucose: 106 mg/dL — ABNORMAL HIGH (ref 65–99)
Potassium: 4.1 mmol/L (ref 3.5–5.2)
Sodium: 138 mmol/L (ref 134–144)
Total Protein: 7.4 g/dL (ref 6.0–8.5)

## 2019-10-11 LAB — MAGNESIUM: Magnesium: 2 mg/dL (ref 1.6–2.3)

## 2019-10-11 LAB — VITAMIN D 25 HYDROXY (VIT D DEFICIENCY, FRACTURES): Vit D, 25-Hydroxy: 12 ng/mL — ABNORMAL LOW (ref 30.0–100.0)

## 2019-10-11 LAB — TSH: TSH: 0.771 u[IU]/mL (ref 0.450–4.500)

## 2019-10-11 LAB — SEDIMENTATION RATE: Sed Rate: 8 mm/hr (ref 0–40)

## 2019-10-11 MED ORDER — VITAMIN D (ERGOCALCIFEROL) 1.25 MG (50000 UNIT) PO CAPS: 50000.0000 [IU] | ORAL_CAPSULE | ORAL | 0 refills | Status: DC

## 2019-10-11 NOTE — Progress Notes (Signed)
If we could call Virginia Cabrera -   Her labs show a very low Vitamin D. This is likely what's causing her symptoms. I will send a prescription strength supplement for her to take - 50,000 units once weekly - for the next 8 weeks. I anticipate we'll see improvement and can recheck levels after that time. Otherwise labs steady, no concerns.  Thank you  Jari Sportsman, NP

## 2019-11-03 ENCOUNTER — Institutional Professional Consult (permissible substitution): Payer: No Typology Code available for payment source | Admitting: Pulmonary Disease

## 2019-11-11 ENCOUNTER — Encounter: Payer: Self-pay | Admitting: Registered Nurse

## 2019-11-11 ENCOUNTER — Other Ambulatory Visit: Payer: Self-pay

## 2019-11-11 ENCOUNTER — Ambulatory Visit (INDEPENDENT_AMBULATORY_CARE_PROVIDER_SITE_OTHER): Payer: Self-pay | Admitting: Registered Nurse

## 2019-11-11 VITALS — BP 126/90 | HR 79 | Temp 98.0°F | Resp 17 | Ht 63.0 in | Wt 226.4 lb

## 2019-11-11 DIAGNOSIS — R252 Cramp and spasm: Secondary | ICD-10-CM

## 2019-11-11 MED ORDER — METHOCARBAMOL 500 MG PO TABS: 500.0000 mg | ORAL_TABLET | Freq: Four times a day (QID) | ORAL | 0 refills | Status: DC

## 2019-11-11 NOTE — Patient Instructions (Signed)
° ° ° °  If you have lab work done today you will be contacted with your lab results within the next 2 weeks.  If you have not heard from us then please contact us. The fastest way to get your results is to register for My Chart. ° ° °IF you received an x-ray today, you will receive an invoice from [facility]. Please contact [facility] at [phone] with questions or concerns regarding your invoice.  ° °IF you received labwork today, you will receive an invoice from [facility]. Please contact [facility] at [phone] with questions or concerns regarding your invoice.  ° °Our billing staff will not be able to assist you with questions regarding bills from these companies. ° °You will be contacted with the lab results as soon as they are available. The fastest way to get your results is to activate your My Chart account. Instructions are located on the last page of this paperwork. If you have not heard from us regarding the results in 2 weeks, please contact this office. °  ° ° ° °

## 2019-11-11 NOTE — Progress Notes (Signed)
Established Patient Office Visit  Subjective:  Patient ID: Virginia Cabrera, female    DOB: 1955/12/16  Age: 64 y.o. MRN: [id number]  CC:  Chief Complaint  Patient presents with  . Follow-up    Patient states she is here for follow up for the cramps. Patient states she is still getting cramps but not as bad,     HPI Virginia Cabrera presents for ongoing cramping Improving Still mostly at night No change in symptoms - no change in diet or exercise Has tried to improve hydration Has been taking Vit D 50,000 units once weekly for 4 weeks  No other concerns or complaints  Past Medical History:  Diagnosis Date  . Allergy   . Anxiety   . Arthritis   . Asthma   . Depression   . Hypertension     Past Surgical History:  Procedure Laterality Date  . CESAREAN SECTION    . TONSILLECTOMY      Family History  Problem Relation Age of Onset  . Breast cancer Mother   . Brain cancer Father   . Breast cancer Maternal Aunt   . Stroke Maternal Aunt   . Hypertension Sister   . Diabetes Brother   . Kidney disease Brother   . Hypertension Brother     Social History   Socioeconomic History  . Marital status: Single    Spouse name: Not on file  . Number of children: 1  . Years of education: Not on file  . Highest education level: 12th grade  Occupational History  . Occupation: Groomer  Tobacco Use  . Smoking status: Former Smoker    Packs/day: 0.20    Years: 15.00    Pack years: 3.00    Types: Cigarettes    Start date: 08/24/1983    Quit date: 08/24/1998    Years since quitting: 21.2  . Smokeless tobacco: Never Used  Vaping Use  . Vaping Use: Never used  Substance and Sexual Activity  . Alcohol use: Yes    Comment: occssionally  . Drug use: No  . Sexual activity: Not on file  Other Topics Concern  . Not on file  Social History Narrative  . Not on file   Social Determinants of Health   Financial Resource Strain:   . Difficulty of Paying Living Expenses: Not  on file  Food Insecurity:   . Worried About Programme researcher, broadcasting/film/video in the Last Year: Not on file  . Ran Out of Food in the Last Year: Not on file  Transportation Needs:   . Lack of Transportation (Medical): Not on file  . Lack of Transportation (Non-Medical): Not on file  Physical Activity:   . Days of Exercise per Week: Not on file  . Minutes of Exercise per Session: Not on file  Stress:   . Feeling of Stress : Not on file  Social Connections:   . Frequency of Communication with Friends and Family: Not on file  . Frequency of Social Gatherings with Friends and Family: Not on file  . Attends Religious Services: Not on file  . Active Member of Clubs or Organizations: Not on file  . Attends Banker Meetings: Not on file  . Marital Status: Not on file  Intimate Partner Violence:   . Fear of Current or Ex-Partner: Not on file  . Emotionally Abused: Not on file  . Physically Abused: Not on file  . Sexually Abused: Not on file    Outpatient Medications  Prior to Visit  Medication Sig Dispense Refill  . albuterol (VENTOLIN HFA) 108 (90 Base) MCG/ACT inhaler INHALE 1 PUFF BY MOUTH EVERY 4 TO 6 HOURS AS NEEDED 18 g 2  . amLODipine (NORVASC) 5 MG tablet Take 1 tablet (5 mg total) by mouth daily. 90 tablet 3  . budesonide-formoterol (SYMBICORT) 160-4.5 MCG/ACT inhaler Inhale 2 puffs into the lungs 2 (two) times daily. 1 Inhaler 3  . celecoxib (CELEBREX) 200 MG capsule TAKE 1 CAPSULE BY MOUTH ONCE DAILY FOR 30 DAYS    . escitalopram (LEXAPRO) 20 MG tablet Take 20 mg by mouth at bedtime.    Marland Kitchen esomeprazole (NEXIUM) 40 MG capsule Take 1 capsule (40 mg total) by mouth daily at 12 noon. 90 capsule 3  . hydrOXYzine (ATARAX/VISTARIL) 25 MG tablet Take 1 tablet (25 mg total) by mouth 3 (three) times daily as needed. 30 tablet 0  . losartan (COZAAR) 100 MG tablet Take 1 tablet (100 mg total) by mouth daily. 90 tablet 3  . meclizine (ANTIVERT) 25 MG tablet Take 1 tablet (25 mg total) by mouth  3 (three) times daily as needed for dizziness. 30 tablet 0  . spironolactone (ALDACTONE) 25 MG tablet Take 1 tablet (25 mg total) by mouth daily. 30 tablet 11  . sucralfate (CARAFATE) 1 g tablet Take 1 tablet (1 g total) by mouth 4 (four) times daily -  with meals and at bedtime. 56 tablet 1  . traMADol (ULTRAM) 50 MG tablet Take 1 tablet by mouth twice daily 60 tablet 0  . traZODone (DESYREL) 100 MG tablet TK 1 T PO HS PRN    . Vitamin D, Ergocalciferol, (DRISDOL) 1.25 MG (50000 UNIT) CAPS capsule Take 1 capsule (50,000 Units total) by mouth every 7 (seven) days. 8 capsule 0   No facility-administered medications prior to visit.    Allergies  Allergen Reactions  . Morphine Other (See Comments)    Causes "coma"  . Morphine And Related     ROS Review of Systems  Constitutional: Negative.   HENT: Negative.   Eyes: Negative.   Respiratory: Negative.   Cardiovascular: Negative.   Gastrointestinal: Negative.   Endocrine: Negative.   Genitourinary: Negative.   Musculoskeletal: Positive for myalgias.  Skin: Negative.   Allergic/Immunologic: Negative.   Neurological: Negative.   Hematological: Negative.   Psychiatric/Behavioral: Negative.   All other systems reviewed and are negative.     Objective:    Physical Exam Vitals and nursing note reviewed.  Constitutional:      General: She is not in acute distress.    Appearance: Normal appearance. She is obese. She is not ill-appearing, toxic-appearing or diaphoretic.  Cardiovascular:     Rate and Rhythm: Normal rate and regular rhythm.     Heart sounds: Normal heart sounds.  Pulmonary:     Effort: Pulmonary effort is normal. No respiratory distress.     Breath sounds: Normal breath sounds.  Skin:    General: Skin is warm and dry.  Neurological:     General: No focal deficit present.     Mental Status: She is alert and oriented to person, place, and time. Mental status is at baseline.     Cranial Nerves: No cranial nerve  deficit.  Psychiatric:        Mood and Affect: Mood normal.        Behavior: Behavior normal.        Thought Content: Thought content normal.        Judgment: Judgment  normal.     BP 126/90   Pulse 79   Temp 98 F (36.7 C) (Temporal)   Resp 17   Ht 5\' 3"  (1.6 m)   Wt 226 lb 6.4 oz (102.7 kg)   SpO2 99%   BMI 40.10 kg/m  Wt Readings from Last 3 Encounters:  11/11/19 226 lb 6.4 oz (102.7 kg)  10/10/19 227 lb 12.8 oz (103.3 kg)  07/05/19 227 lb 3.2 oz (103.1 kg)     There are no preventive care reminders to display for this patient.  There are no preventive care reminders to display for this patient.  Lab Results  Component Value Date   TSH 0.771 10/10/2019   Lab Results  Component Value Date   WBC 6.9 07/05/2019   HGB 14.4 07/05/2019   HCT 41.7 07/05/2019   MCV 90 07/05/2019   PLT 267 07/05/2019   Lab Results  Component Value Date   NA 138 10/10/2019   K 4.1 10/10/2019   CO2 24 10/10/2019   GLUCOSE 106 (H) 10/10/2019   BUN 16 10/10/2019   CREATININE 1.16 (H) 10/10/2019   BILITOT 0.4 10/10/2019   ALKPHOS 77 10/10/2019   AST 28 10/10/2019   ALT 29 10/10/2019   PROT 7.4 10/10/2019   ALBUMIN 4.5 10/10/2019   CALCIUM 10.0 10/10/2019   Lab Results  Component Value Date   CHOL 204 (H) 07/05/2019   Lab Results  Component Value Date   HDL 49 07/05/2019   Lab Results  Component Value Date   LDLCALC 113 (H) 07/05/2019   Lab Results  Component Value Date   TRIG 245 (H) 07/05/2019   Lab Results  Component Value Date   CHOLHDL 4.2 07/05/2019   Lab Results  Component Value Date   HGBA1C 5.5 07/05/2019      Assessment & Plan:   Problem List Items Addressed This Visit    None    Visit Diagnoses    Leg cramping    -  Primary   Relevant Medications   methocarbamol (ROBAXIN) 500 MG tablet      No orders of the defined types were placed in this encounter.   Follow-up: No follow-ups on file.   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 But with pt hx of varicose veins, will continue to monitor for evidence of pvd. Will refer to vascular specialist once she has insurance, which should be early 2022. Reviewed red flags that would warrant concern. Pt demonstrates understanding.  Patient encouraged to call clinic with any questions, comments, or concerns.  2023, NP

## 2019-12-02 ENCOUNTER — Other Ambulatory Visit: Payer: Self-pay | Admitting: Registered Nurse

## 2019-12-22 ENCOUNTER — Other Ambulatory Visit: Payer: Self-pay | Admitting: Registered Nurse

## 2019-12-22 DIAGNOSIS — M1712 Unilateral primary osteoarthritis, left knee: Secondary | ICD-10-CM

## 2019-12-22 NOTE — Telephone Encounter (Signed)
Patient is requesting a refill of the following medications: Requested Prescriptions   Pending Prescriptions Disp Refills   traMADol (ULTRAM) 50 MG tablet [Pharmacy Med Name: traMADol HCl 50 MG Oral Tablet] 60 tablet 0    Sig: Take 1 tablet by mouth twice daily    Date of patient request: 12/22/2019 Last office visit: 10/10/19 Date of last refill: 09/05/19 Last refill amount: 60 Follow up time period per chart: 01/03/20

## 2020-01-03 ENCOUNTER — Encounter: Payer: Self-pay | Admitting: Registered Nurse

## 2020-01-03 ENCOUNTER — Other Ambulatory Visit: Payer: Self-pay

## 2020-01-03 ENCOUNTER — Ambulatory Visit (INDEPENDENT_AMBULATORY_CARE_PROVIDER_SITE_OTHER): Payer: Self-pay | Admitting: Registered Nurse

## 2020-01-03 VITALS — BP 136/87 | HR 88 | Temp 98.0°F | Resp 18 | Ht 63.0 in | Wt 228.0 lb

## 2020-01-03 DIAGNOSIS — I10 Essential (primary) hypertension: Secondary | ICD-10-CM

## 2020-01-03 NOTE — Patient Instructions (Signed)
° ° ° °  If you have lab work done today you will be contacted with your lab results within the next 2 weeks.  If you have not heard from us then please contact us. The fastest way to get your results is to register for My Chart. ° ° °IF you received an x-ray today, you will receive an invoice from [facility]. Please contact [facility] at [phone] with questions or concerns regarding your invoice.  ° °IF you received labwork today, you will receive an invoice from [facility]. Please contact [facility] at [phone] with questions or concerns regarding your invoice.  ° °Our billing staff will not be able to assist you with questions regarding bills from these companies. ° °You will be contacted with the lab results as soon as they are available. The fastest way to get your results is to activate your My Chart account. Instructions are located on the last page of this paperwork. If you have not heard from us regarding the results in 2 weeks, please contact this office. °  ° ° ° °

## 2020-01-05 ENCOUNTER — Other Ambulatory Visit: Payer: Self-pay | Admitting: Registered Nurse

## 2020-01-05 DIAGNOSIS — E559 Vitamin D deficiency, unspecified: Secondary | ICD-10-CM

## 2020-01-05 NOTE — Telephone Encounter (Signed)
Pt calling stated she saw provider on Tuesday 01/03/20 and was suppose to proscribed these medications listed below.   was suppose to be proscribed klonopin and vitamin D  [id number] - [place] ([address]), [place] - [address]  Please advise.

## 2020-01-06 NOTE — Telephone Encounter (Signed)
Pt is calling checking on medications

## 2020-01-06 NOTE — Telephone Encounter (Signed)
Pt was last seen on 01/03/20 and was supposed to get a couple of medications. I did not see any record of the medications on her list or any mention of them in the office note. Please advise if she was supposed to get Klonopin and Vit D at the last visit.

## 2020-01-09 NOTE — Telephone Encounter (Signed)
Patient is calling in again regarding her anxiety meds

## 2020-01-11 ENCOUNTER — Telehealth: Payer: Self-pay | Admitting: Registered Nurse

## 2020-01-11 MED ORDER — VITAMIN D (ERGOCALCIFEROL) 1.25 MG (50000 UNIT) PO CAPS: 50000.0000 [IU] | ORAL_CAPSULE | ORAL | 0 refills | Status: DC

## 2020-01-11 NOTE — Telephone Encounter (Signed)
01/11/2020 - PATIENT STATES HER VITAMIN D WAS CALLED INTO HER PHARMACY BUT NOT THE KLONOPIN. HER ANXIETY IS VERY BAD AND SHE NEEDS IT BEFORE SHE GOES OUT OF TOWN EARLY ON Friday MORNING. PLEASE CALL HER WHEN IT HAS BEEN DONE. BEST PHONE FOR PATIENT IS [phone] (CELL)  PHARMACY CHOICE IS [facility] [address]  [facility]

## 2020-01-11 NOTE — Telephone Encounter (Signed)
Patient is still calling in for her request Klonopin and Vit D     Please advise

## 2020-01-11 NOTE — Telephone Encounter (Signed)
Patient is requesting a refill of the following medications: Requested Prescriptions   Pending Prescriptions Disp Refills  . Vitamin D, Ergocalciferol, (DRISDOL) 1.25 MG (50000 UNIT) CAPS capsule 8 capsule 0    Sig: Take 1 capsule (50,000 Units total) by mouth every 7 (seven) days.   Patient also need Klonopin as well.

## 2020-01-12 NOTE — Telephone Encounter (Signed)
Pt states you forgot to send in Klonapin refill however I do  not see previous Rx for this please advise.

## 2020-01-24 ENCOUNTER — Encounter: Payer: Self-pay | Admitting: Registered Nurse

## 2020-01-24 ENCOUNTER — Other Ambulatory Visit: Payer: Self-pay

## 2020-01-24 ENCOUNTER — Ambulatory Visit (INDEPENDENT_AMBULATORY_CARE_PROVIDER_SITE_OTHER): Payer: Self-pay | Admitting: Registered Nurse

## 2020-01-24 VITALS — BP 131/85 | HR 83 | Temp 98.3°F | Resp 18 | Ht 63.0 in | Wt 227.0 lb

## 2020-01-24 DIAGNOSIS — Z8639 Personal history of other endocrine, nutritional and metabolic disease: Secondary | ICD-10-CM

## 2020-01-24 DIAGNOSIS — F411 Generalized anxiety disorder: Secondary | ICD-10-CM

## 2020-01-24 MED ORDER — CLONAZEPAM 0.5 MG PO TABS: 0.5000 mg | ORAL_TABLET | Freq: Two times a day (BID) | ORAL | 1 refills | Status: DC | PRN

## 2020-01-24 MED ORDER — SPIRONOLACTONE 25 MG PO TABS: 25.0000 mg | ORAL_TABLET | Freq: Every day | ORAL | 3 refills | Status: DC

## 2020-01-24 NOTE — Patient Instructions (Signed)
° ° ° °  If you have lab work done today you will be contacted with your lab results within the next 2 weeks.  If you have not heard from us then please contact us. The fastest way to get your results is to register for My Chart. ° ° °IF you received an x-ray today, you will receive an invoice from [facility]. Please contact [facility] at [phone] with questions or concerns regarding your invoice.  ° °IF you received labwork today, you will receive an invoice from [facility]. Please contact [facility] at [phone] with questions or concerns regarding your invoice.  ° °Our billing staff will not be able to assist you with questions regarding bills from these companies. ° °You will be contacted with the lab results as soon as they are available. The fastest way to get your results is to activate your My Chart account. Instructions are located on the last page of this paperwork. If you have not heard from us regarding the results in 2 weeks, please contact this office. °  ° ° ° °

## 2020-03-22 ENCOUNTER — Other Ambulatory Visit: Payer: Self-pay | Admitting: Registered Nurse

## 2020-03-22 DIAGNOSIS — M1712 Unilateral primary osteoarthritis, left knee: Secondary | ICD-10-CM

## 2020-03-22 NOTE — Telephone Encounter (Signed)
Patient is requesting a refill of the following medications: Requested Prescriptions   Pending Prescriptions Disp Refills   traMADol (ULTRAM) 50 MG tablet [Pharmacy Med Name: traMADol HCl 50 MG Oral Tablet] 60 tablet 0    Sig: Take 1 tablet by mouth twice daily    Date of patient request: 03/22/20 Last office visit: 01/24/20 Date of last refill: 12/22/19 Last refill amount: 60 Follow up time period per chart: 06/19/20

## 2020-03-23 ENCOUNTER — Encounter: Payer: Self-pay | Admitting: Registered Nurse

## 2020-03-23 DIAGNOSIS — I10 Essential (primary) hypertension: Secondary | ICD-10-CM | POA: Insufficient documentation

## 2020-03-23 NOTE — Progress Notes (Signed)
Established Patient Office Visit  Subjective:  Patient ID: Virginia Cabrera, female    DOB: 1955/03/21  Age: 65 y.o. MRN: [id number]  CC:  Chief Complaint  Patient presents with  . Follow-up    6 month follow up and also to discuss vitamin D.    HPI Virginia Cabrera presents for 6 mo f/u on chronic conditions  Needs to recheck vit d or restart supplement Otherwise meds seem to be working well, no new complaints  Hypertension: Patient Currently taking: amlodipine 5mg  PO qd, spironolactone 25mg  PO qd, losartan 100mg  PO qd. Good effect. No AEs. Denies CV symptoms including: chest pain, shob, doe, headache, visual changes, fatigue, claudication, and dependent edema.   Previous readings and labs: BP Readings from Last 3 Encounters:  01/24/20 131/85  01/03/20 136/87  11/11/19 126/90   Lab Results  Component Value Date   CREATININE 1.16 (H) 10/10/2019   Depression/anxiety/sleep disturbance: Taking lexapro 20mg  PO qd, trazodone 100mg  PO qhs prn. Good effect No aes Wants to continue Denies hi/si  Otherwise feeling well.    Past Medical History:  Diagnosis Date  . Allergy   . Anxiety   . Arthritis   . Asthma   . Depression   . Hypertension     Past Surgical History:  Procedure Laterality Date  . CESAREAN SECTION    . TONSILLECTOMY      Family History  Problem Relation Age of Onset  . Breast cancer Mother   . Brain cancer Father   . Breast cancer Maternal Aunt   . Stroke Maternal Aunt   . Hypertension Sister   . Diabetes Brother   . Kidney disease Brother   . Hypertension Brother     Social History   Socioeconomic History  . Marital status: Single    Spouse name: Not on file  . Number of children: 1  . Years of education: Not on file  . Highest education level: 12th grade  Occupational History  . Occupation: Groomer  Tobacco Use  . Smoking status: Former Smoker    Packs/day: 0.20    Years: 15.00    Pack years: 3.00    Types: Cigarettes     Start date: 08/24/1983    Quit date: 08/24/1998    Years since quitting: 21.5  . Smokeless tobacco: Never Used  Vaping Use  . Vaping Use: Never used  Substance and Sexual Activity  . Alcohol use: Yes    Comment: occssionally  . Drug use: No  . Sexual activity: Not on file  Other Topics Concern  . Not on file  Social History Narrative  . Not on file   Social Determinants of Health   Financial Resource Strain: Not on file  Food Insecurity: Not on file  Transportation Needs: Not on file  Physical Activity: Not on file  Stress: Not on file  Social Connections: Not on file  Intimate Partner Violence: Not on file    Outpatient Medications Prior to Visit  Medication Sig Dispense Refill  . albuterol (VENTOLIN HFA) 108 (90 Base) MCG/ACT inhaler INHALE 1 PUFF BY MOUTH EVERY 4 TO 6 HOURS AS NEEDED 18 g 2  . amLODipine (NORVASC) 5 MG tablet Take 1 tablet (5 mg total) by mouth daily. 90 tablet 3  . budesonide-formoterol (SYMBICORT) 160-4.5 MCG/ACT inhaler Inhale 2 puffs into the lungs 2 (two) times daily. 1 Inhaler 3  . celecoxib (CELEBREX) 200 MG capsule TAKE 1 CAPSULE BY MOUTH ONCE DAILY FOR 30 DAYS    .  escitalopram (LEXAPRO) 20 MG tablet Take 20 mg by mouth at bedtime.    Marland Kitchen esomeprazole (NEXIUM) 40 MG capsule Take 1 capsule (40 mg total) by mouth daily at 12 noon. 90 capsule 3  . losartan (COZAAR) 100 MG tablet Take 1 tablet (100 mg total) by mouth daily. 90 tablet 3  . meclizine (ANTIVERT) 25 MG tablet Take 1 tablet (25 mg total) by mouth 3 (three) times daily as needed for dizziness. 30 tablet 0  . methocarbamol (ROBAXIN) 500 MG tablet Take 1 tablet (500 mg total) by mouth 4 (four) times daily. 60 tablet 0  . sucralfate (CARAFATE) 1 g tablet Take 1 tablet (1 g total) by mouth 4 (four) times daily -  with meals and at bedtime. 56 tablet 1  . traMADol (ULTRAM) 50 MG tablet Take 1 tablet by mouth twice daily 60 tablet 0  . traZODone (DESYREL) 100 MG tablet TK 1 T PO HS PRN    .  hydrOXYzine (ATARAX/VISTARIL) 25 MG tablet Take 1 tablet (25 mg total) by mouth 3 (three) times daily as needed. 30 tablet 0  . spironolactone (ALDACTONE) 25 MG tablet Take 1 tablet (25 mg total) by mouth daily. 30 tablet 11  . Vitamin D, Ergocalciferol, (DRISDOL) 1.25 MG (50000 UNIT) CAPS capsule Take 1 capsule (50,000 Units total) by mouth every 7 (seven) days. (Patient not taking: Reported on 01/03/2020) 8 capsule 0   No facility-administered medications prior to visit.    Allergies  Allergen Reactions  . Morphine Other (See Comments)    Causes "coma"  . Morphine And Related     ROS Review of Systems  Constitutional: Negative.   HENT: Negative.   Eyes: Negative.   Respiratory: Negative.   Cardiovascular: Negative.   Gastrointestinal: Negative.   Genitourinary: Negative.   Musculoskeletal: Negative.   Skin: Negative.   Neurological: Negative.   Psychiatric/Behavioral: Negative.   All other systems reviewed and are negative.     Objective:    Physical Exam Vitals and nursing note reviewed.  Constitutional:      General: She is not in acute distress.    Appearance: Normal appearance. She is normal weight. She is not ill-appearing, toxic-appearing or diaphoretic.  Cardiovascular:     Rate and Rhythm: Normal rate and regular rhythm.     Heart sounds: Normal heart sounds. No murmur heard. No friction rub. No gallop.   Pulmonary:     Effort: Pulmonary effort is normal. No respiratory distress.     Breath sounds: Normal breath sounds. No stridor. No wheezing, rhonchi or rales.  Chest:     Chest wall: No tenderness.  Skin:    General: Skin is warm and dry.  Neurological:     General: No focal deficit present.     Mental Status: She is alert and oriented to person, place, and time. Mental status is at baseline.  Psychiatric:        Mood and Affect: Mood normal.        Behavior: Behavior normal.        Thought Content: Thought content normal.        Judgment: Judgment  normal.     BP 136/87   Pulse 88   Temp 98 F (36.7 C) (Temporal)   Resp 18   Ht 5\' 3"  (1.6 m)   Wt 228 lb (103.4 kg)   SpO2 97%   BMI 40.39 kg/m  Wt Readings from Last 3 Encounters:  01/24/20 227 lb (103 kg)  01/03/20  228 lb (103.4 kg)  11/11/19 226 lb 6.4 oz (102.7 kg)     Health Maintenance Due  Topic Date Due  . COVID-19 Vaccine (1) Never done    There are no preventive care reminders to display for this patient.  Lab Results  Component Value Date   TSH 0.771 10/10/2019   Lab Results  Component Value Date   WBC 6.9 07/05/2019   HGB 14.4 07/05/2019   HCT 41.7 07/05/2019   MCV 90 07/05/2019   PLT 267 07/05/2019   Lab Results  Component Value Date   NA 138 10/10/2019   K 4.1 10/10/2019   CO2 24 10/10/2019   GLUCOSE 106 (H) 10/10/2019   BUN 16 10/10/2019   CREATININE 1.16 (H) 10/10/2019   BILITOT 0.4 10/10/2019   ALKPHOS 77 10/10/2019   AST 28 10/10/2019   ALT 29 10/10/2019   PROT 7.4 10/10/2019   ALBUMIN 4.5 10/10/2019   CALCIUM 10.0 10/10/2019   Lab Results  Component Value Date   CHOL 204 (H) 07/05/2019   Lab Results  Component Value Date   HDL 49 07/05/2019   Lab Results  Component Value Date   LDLCALC 113 (H) 07/05/2019   Lab Results  Component Value Date   TRIG 245 (H) 07/05/2019   Lab Results  Component Value Date   CHOLHDL 4.2 07/05/2019   Lab Results  Component Value Date   HGBA1C 5.5 07/05/2019      Assessment & Plan:   Problem List Items Addressed This Visit      Cardiovascular and Mediastinum   Essential hypertension - Primary      No orders of the defined types were placed in this encounter.   Follow-up: No follow-ups on file.   PLAN  Continue current regimen  Return in 6 mo  Patient encouraged to call clinic with any questions, comments, or concerns.   Janeece Agee, NP

## 2020-03-28 ENCOUNTER — Encounter: Payer: Self-pay | Admitting: Registered Nurse

## 2020-03-28 ENCOUNTER — Telehealth (INDEPENDENT_AMBULATORY_CARE_PROVIDER_SITE_OTHER): Payer: Self-pay | Admitting: Registered Nurse

## 2020-03-28 ENCOUNTER — Other Ambulatory Visit: Payer: Self-pay

## 2020-03-28 DIAGNOSIS — R1114 Bilious vomiting: Secondary | ICD-10-CM

## 2020-03-28 MED ORDER — DEXAMETHASONE 1 MG PO TABS: 1.0000 mg | ORAL_TABLET | Freq: Two times a day (BID) | ORAL | 0 refills | Status: DC

## 2020-03-28 NOTE — Patient Instructions (Signed)
° ° ° °  If you have lab work done today you will be contacted with your lab results within the next 2 weeks.  If you have not heard from us then please contact us. The fastest way to get your results is to register for My Chart. ° ° °IF you received an x-ray today, you will receive an invoice from [facility]. Please contact [facility] at [phone] with questions or concerns regarding your invoice.  ° °IF you received labwork today, you will receive an invoice from [facility]. Please contact [facility] at [phone] with questions or concerns regarding your invoice.  ° °Our billing staff will not be able to assist you with questions regarding bills from these companies. ° °You will be contacted with the lab results as soon as they are available. The fastest way to get your results is to activate your My Chart account. Instructions are located on the last page of this paperwork. If you have not heard from us regarding the results in 2 weeks, please contact this office. °  ° ° ° °

## 2020-04-02 ENCOUNTER — Encounter: Payer: Self-pay | Admitting: Registered Nurse

## 2020-04-02 NOTE — Progress Notes (Signed)
Established Patient Office Visit  Subjective:  Patient ID: Virginia Cabrera, female    DOB: [birth year]  Age: 65 y.o. MRN: [id number]  CC:  Chief Complaint  Patient presents with  . Follow-up    Patient states she has been still having som anxiety issues. [id number] patient also needs here medication that was discussed    HPI Virginia Cabrera presents for med refills  Still having high anxiety - we had clonopin at a previous visit in depth, unfortunately I had never sent this due to a miscommunication  Needs refill on spironolactone. Working well. No complaints.  Past Medical History:  Diagnosis Date  . Allergy   . Anxiety   . Arthritis   . Asthma   . Depression   . Hypertension     Past Surgical History:  Procedure Laterality Date  . CESAREAN SECTION    . TONSILLECTOMY      Family History  Problem Relation Age of Onset  . Breast cancer Mother   . Brain cancer Father   . Breast cancer Maternal Aunt   . Stroke Maternal Aunt   . Hypertension Sister   . Diabetes Brother   . Kidney disease Brother   . Hypertension Brother     Social History   Socioeconomic History  . Marital status: Single    Spouse name: Not on file  . Number of children: 1  . Years of education: Not on file  . Highest education level: 12th grade  Occupational History  . Occupation: Groomer  Tobacco Use  . Smoking status: Former Smoker    Packs/day: 0.20    Years: 15.00    Pack years: 3.00    Types: Cigarettes    Start date: 08/24/1983    Quit date: 08/24/1998    Years since quitting: 21.6  . Smokeless tobacco: Never Used  Vaping Use  . Vaping Use: Never used  Substance and Sexual Activity  . Alcohol use: Yes    Comment: occssionally  . Drug use: No  . Sexual activity: Not on file  Other Topics Concern  . Not on file  Social History Narrative  . Not on file   Social Determinants of Health   Financial Resource Strain: Not on file  Food Insecurity: Not on file  Transportation  Needs: Not on file  Physical Activity: Not on file  Stress: Not on file  Social Connections: Not on file  Intimate Partner Violence: Not on file    Outpatient Medications Prior to Visit  Medication Sig Dispense Refill  . albuterol (VENTOLIN HFA) 108 (90 Base) MCG/ACT inhaler INHALE 1 PUFF BY MOUTH EVERY 4 TO 6 HOURS AS NEEDED 18 g 2  . amLODipine (NORVASC) 5 MG tablet Take 1 tablet (5 mg total) by mouth daily. 90 tablet 3  . budesonide-formoterol (SYMBICORT) 160-4.5 MCG/ACT inhaler Inhale 2 puffs into the lungs 2 (two) times daily. 1 Inhaler 3  . celecoxib (CELEBREX) 200 MG capsule TAKE 1 CAPSULE BY MOUTH ONCE DAILY FOR 30 DAYS    . escitalopram (LEXAPRO) 20 MG tablet Take 20 mg by mouth at bedtime.    Marland Kitchen esomeprazole (NEXIUM) 40 MG capsule Take 1 capsule (40 mg total) by mouth daily at 12 noon. 90 capsule 3  . losartan (COZAAR) 100 MG tablet Take 1 tablet (100 mg total) by mouth daily. 90 tablet 3  . meclizine (ANTIVERT) 25 MG tablet Take 1 tablet (25 mg total) by mouth 3 (three) times daily as needed for dizziness. 30  tablet 0  . methocarbamol (ROBAXIN) 500 MG tablet Take 1 tablet (500 mg total) by mouth 4 (four) times daily. 60 tablet 0  . sucralfate (CARAFATE) 1 g tablet Take 1 tablet (1 g total) by mouth 4 (four) times daily -  with meals and at bedtime. 56 tablet 1  . traZODone (DESYREL) 100 MG tablet TK 1 T PO HS PRN    . Vitamin D, Ergocalciferol, (DRISDOL) 1.25 MG (50000 UNIT) CAPS capsule Take 1 capsule (50,000 Units total) by mouth every 7 (seven) days. (Patient not taking: Reported on 03/28/2020) 8 capsule 0  . hydrOXYzine (ATARAX/VISTARIL) 25 MG tablet Take 1 tablet (25 mg total) by mouth 3 (three) times daily as needed. 30 tablet 0  . spironolactone (ALDACTONE) 25 MG tablet Take 1 tablet (25 mg total) by mouth daily. 30 tablet 11  . traMADol (ULTRAM) 50 MG tablet Take 1 tablet by mouth twice daily 60 tablet 0   No facility-administered medications prior to visit.    Allergies   Allergen Reactions  . Morphine Other (See Comments)    Causes "coma"  . Morphine And Related     ROS Review of Systems  Constitutional: Negative.   HENT: Negative.   Eyes: Negative.   Respiratory: Negative.   Cardiovascular: Negative.   Gastrointestinal: Negative.   Genitourinary: Negative.   Musculoskeletal: Negative.   Skin: Negative.   Neurological: Negative.   Psychiatric/Behavioral: Negative.   All other systems reviewed and are negative.     Objective:    Physical Exam Vitals and nursing note reviewed.  Constitutional:      General: She is not in acute distress.    Appearance: Normal appearance. She is normal weight. She is not ill-appearing, toxic-appearing or diaphoretic.  Cardiovascular:     Rate and Rhythm: Normal rate and regular rhythm.     Heart sounds: Normal heart sounds. No murmur heard. No friction rub. No gallop.   Pulmonary:     Effort: Pulmonary effort is normal. No respiratory distress.     Breath sounds: Normal breath sounds. No stridor. No wheezing, rhonchi or rales.  Chest:     Chest wall: No tenderness.  Skin:    General: Skin is warm and dry.  Neurological:     General: No focal deficit present.     Mental Status: She is alert and oriented to person, place, and time. Mental status is at baseline.  Psychiatric:        Mood and Affect: Mood normal.        Behavior: Behavior normal.        Thought Content: Thought content normal.        Judgment: Judgment normal.     BP 131/85   Pulse 83   Temp 98.3 F (36.8 C) (Temporal)   Resp 18   Ht 5\' 3"  (1.6 m)   Wt 227 lb (103 kg)   SpO2 97%   BMI 40.21 kg/m  Wt Readings from Last 3 Encounters:  01/24/20 227 lb (103 kg)  01/03/20 228 lb (103.4 kg)  11/11/19 226 lb 6.4 oz (102.7 kg)     There are no preventive care reminders to display for this patient.  There are no preventive care reminders to display for this patient.  Lab Results  Component Value Date   TSH 0.771 10/10/2019    Lab Results  Component Value Date   WBC 6.9 07/05/2019   HGB 14.4 07/05/2019   HCT 41.7 07/05/2019   MCV 90 07/05/2019  PLT 267 07/05/2019   Lab Results  Component Value Date   NA 138 10/10/2019   K 4.1 10/10/2019   CO2 24 10/10/2019   GLUCOSE 106 (H) 10/10/2019   BUN 16 10/10/2019   CREATININE 1.16 (H) 10/10/2019   BILITOT 0.4 10/10/2019   ALKPHOS 77 10/10/2019   AST 28 10/10/2019   ALT 29 10/10/2019   PROT 7.4 10/10/2019   ALBUMIN 4.5 10/10/2019   CALCIUM 10.0 10/10/2019   Lab Results  Component Value Date   CHOL 204 (H) 07/05/2019   Lab Results  Component Value Date   HDL 49 07/05/2019   Lab Results  Component Value Date   LDLCALC 113 (H) 07/05/2019   Lab Results  Component Value Date   TRIG 245 (H) 07/05/2019   Lab Results  Component Value Date   CHOLHDL 4.2 07/05/2019   Lab Results  Component Value Date   HGBA1C 5.5 07/05/2019      Assessment & Plan:   Problem List Items Addressed This Visit   None   Visit Diagnoses    Generalized anxiety disorder    -  Primary   Relevant Medications   clonazePAM (KLONOPIN) 0.5 MG tablet   History of hypokalemia       Relevant Medications   spironolactone (ALDACTONE) 25 MG tablet      Meds ordered this encounter  Medications  . clonazePAM (KLONOPIN) 0.5 MG tablet    Sig: Take 1 tablet (0.5 mg total) by mouth 2 (two) times daily as needed for anxiety.    Dispense:  60 tablet    Refill:  1    Order Specific Question:   Supervising Provider    Answer:   Neva Seat, JEFFREY R [[id number]]  . spironolactone (ALDACTONE) 25 MG tablet    Sig: Take 1 tablet (25 mg total) by mouth daily.    Dispense:  90 tablet    Refill:  3    Order Specific Question:   Supervising Provider    Answer:   Neva Seat, JEFFREY R [[id number]]    Follow-up: No follow-ups on file.   PLAN  Visit extremely brief just to discuss medication that had already been discussed. As such, [place] for this visit - no billable information covered  Patient  encouraged to call clinic with any questions, comments, or concerns.  Janeece Agee, NP

## 2020-06-12 ENCOUNTER — Other Ambulatory Visit: Payer: Self-pay | Admitting: Registered Nurse

## 2020-06-12 DIAGNOSIS — M1712 Unilateral primary osteoarthritis, left knee: Secondary | ICD-10-CM

## 2020-06-19 ENCOUNTER — Ambulatory Visit (INDEPENDENT_AMBULATORY_CARE_PROVIDER_SITE_OTHER): Payer: Medicare (Managed Care) | Admitting: Registered Nurse

## 2020-06-19 ENCOUNTER — Other Ambulatory Visit: Payer: Self-pay

## 2020-06-19 VITALS — BP 128/76 | HR 77 | Temp 98.2°F | Resp 17 | Ht 63.0 in | Wt 225.4 lb

## 2020-06-19 DIAGNOSIS — Z13220 Encounter for screening for lipoid disorders: Secondary | ICD-10-CM | POA: Diagnosis not present

## 2020-06-19 DIAGNOSIS — E559 Vitamin D deficiency, unspecified: Secondary | ICD-10-CM

## 2020-06-19 DIAGNOSIS — K219 Gastro-esophageal reflux disease without esophagitis: Secondary | ICD-10-CM | POA: Diagnosis not present

## 2020-06-19 DIAGNOSIS — I10 Essential (primary) hypertension: Secondary | ICD-10-CM | POA: Diagnosis not present

## 2020-06-19 DIAGNOSIS — Z130 Encounter for screening for diseases of the blood and blood-forming organs and certain disorders involving the immune mechanism: Secondary | ICD-10-CM | POA: Diagnosis not present

## 2020-06-19 DIAGNOSIS — Z8639 Personal history of other endocrine, nutritional and metabolic disease: Secondary | ICD-10-CM | POA: Diagnosis not present

## 2020-06-19 DIAGNOSIS — R42 Dizziness and giddiness: Secondary | ICD-10-CM

## 2020-06-19 DIAGNOSIS — Z13228 Encounter for screening for other metabolic disorders: Secondary | ICD-10-CM | POA: Diagnosis not present

## 2020-06-19 DIAGNOSIS — R0602 Shortness of breath: Secondary | ICD-10-CM

## 2020-06-19 DIAGNOSIS — R252 Cramp and spasm: Secondary | ICD-10-CM

## 2020-06-19 DIAGNOSIS — M1712 Unilateral primary osteoarthritis, left knee: Secondary | ICD-10-CM

## 2020-06-19 DIAGNOSIS — Z1329 Encounter for screening for other suspected endocrine disorder: Secondary | ICD-10-CM | POA: Diagnosis not present

## 2020-06-19 DIAGNOSIS — F411 Generalized anxiety disorder: Secondary | ICD-10-CM

## 2020-06-19 LAB — CBC WITH DIFFERENTIAL/PLATELET
Basophils Absolute: 0 10*3/uL (ref 0.0–0.1)
Basophils Relative: 0.6 % (ref 0.0–3.0)
Eosinophils Absolute: 0.1 10*3/uL (ref 0.0–0.7)
Eosinophils Relative: 1.2 % (ref 0.0–5.0)
HCT: 40.8 % (ref 36.0–46.0)
Hemoglobin: 13.8 g/dL (ref 12.0–15.0)
Lymphocytes Relative: 32.2 % (ref 12.0–46.0)
Lymphs Abs: 2.1 10*3/uL (ref 0.7–4.0)
MCHC: 33.9 g/dL (ref 30.0–36.0)
MCV: 90.5 fl (ref 78.0–100.0)
Monocytes Absolute: 0.5 10*3/uL (ref 0.1–1.0)
Monocytes Relative: 8.4 % (ref 3.0–12.0)
Neutro Abs: 3.7 10*3/uL (ref 1.4–7.7)
Neutrophils Relative %: 57.6 % (ref 43.0–77.0)
Platelets: 215 10*3/uL (ref 150.0–400.0)
RBC: 4.51 Mil/uL (ref 3.87–5.11)
RDW: 13.3 % (ref 11.5–15.5)
WBC: 6.4 10*3/uL (ref 4.0–10.5)

## 2020-06-19 LAB — COMPREHENSIVE METABOLIC PANEL
ALT: 31 U/L (ref 0–35)
AST: 34 U/L (ref 0–37)
Albumin: 4.3 g/dL (ref 3.5–5.2)
Alkaline Phosphatase: 62 U/L (ref 39–117)
BUN: 16 mg/dL (ref 6–23)
CO2: 25 mEq/L (ref 19–32)
Calcium: 9.8 mg/dL (ref 8.4–10.5)
Chloride: 102 mEq/L (ref 96–112)
Creatinine, Ser: 1.12 mg/dL (ref 0.40–1.20)
GFR: 52.75 mL/min — ABNORMAL LOW (ref >60.00)
Glucose, Bld: 79 mg/dL (ref 70–99)
Potassium: 4.1 mEq/L (ref 3.5–5.1)
Sodium: 138 mEq/L (ref 135–145)
Total Bilirubin: 0.5 mg/dL (ref 0.2–1.2)
Total Protein: 7.7 g/dL (ref 6.0–8.3)

## 2020-06-19 LAB — VITAMIN D 25 HYDROXY (VIT D DEFICIENCY, FRACTURES): VITD: 26.61 ng/mL — ABNORMAL LOW (ref 30.00–100.00)

## 2020-06-19 LAB — HEMOGLOBIN A1C: Hgb A1c MFr Bld: 5.6 % (ref 4.6–6.5)

## 2020-06-19 LAB — LIPID PANEL
Cholesterol: 194 mg/dL (ref 0–200)
HDL: 50.3 mg/dL (ref >39.00)
LDL Cholesterol: 108 mg/dL — ABNORMAL HIGH (ref 0–99)
NonHDL: 143.86
Total CHOL/HDL Ratio: 4
Triglycerides: 179 mg/dL — ABNORMAL HIGH (ref 0.0–149.0)
VLDL: 35.8 mg/dL (ref 0.0–40.0)

## 2020-06-19 MED ORDER — SPIRONOLACTONE 25 MG PO TABS: 25.0000 mg | ORAL_TABLET | Freq: Every day | ORAL | 3 refills | Status: DC

## 2020-06-19 MED ORDER — AMLODIPINE BESYLATE 5 MG PO TABS
5.0000 mg | ORAL_TABLET | Freq: Every day | ORAL | 3 refills | Status: DC
Start: 2020-06-19 — End: 2021-01-04

## 2020-06-19 MED ORDER — LOSARTAN POTASSIUM 100 MG PO TABS: 100.0000 mg | ORAL_TABLET | Freq: Every day | ORAL | 3 refills | Status: DC

## 2020-06-19 MED ORDER — ESOMEPRAZOLE MAGNESIUM 40 MG PO CPDR: 40.0000 mg | DELAYED_RELEASE_CAPSULE | Freq: Every day | ORAL | 3 refills | Status: DC

## 2020-06-19 MED ORDER — SUCRALFATE 1 G PO TABS: 1.0000 g | ORAL_TABLET | Freq: Three times a day (TID) | ORAL | 1 refills | Status: DC

## 2020-06-19 MED ORDER — FLOVENT HFA 220 MCG/ACT IN AERO: 2.0000 | INHALATION_SPRAY | Freq: Two times a day (BID) | RESPIRATORY_TRACT | 12 refills | Status: DC

## 2020-06-19 MED ORDER — MECLIZINE HCL 25 MG PO TABS: 25.0000 mg | ORAL_TABLET | Freq: Three times a day (TID) | ORAL | 0 refills | Status: AC | PRN

## 2020-06-19 MED ORDER — ALBUTEROL SULFATE HFA 108 (90 BASE) MCG/ACT IN AERS: INHALATION_SPRAY | RESPIRATORY_TRACT | 2 refills | Status: DC

## 2020-06-19 MED ORDER — BUDESONIDE-FORMOTEROL FUMARATE 160-4.5 MCG/ACT IN AERO: 2.0000 | INHALATION_SPRAY | Freq: Two times a day (BID) | RESPIRATORY_TRACT | 3 refills | Status: DC

## 2020-06-19 MED ORDER — METHOCARBAMOL 500 MG PO TABS: 500.0000 mg | ORAL_TABLET | Freq: Four times a day (QID) | ORAL | 0 refills | Status: DC

## 2020-06-19 MED ORDER — TRAMADOL HCL 50 MG PO TABS: 50.0000 mg | ORAL_TABLET | Freq: Two times a day (BID) | ORAL | 0 refills | Status: DC | PRN

## 2020-06-25 ENCOUNTER — Encounter: Payer: Self-pay | Admitting: Registered Nurse

## 2020-06-25 NOTE — Progress Notes (Signed)
Established Patient Office Visit  Subjective:  Patient ID: Virginia Cabrera, female    DOB: 08-Apr-1955  Age: 65 y.o. MRN: [id number]  CC:  Chief Complaint  Patient presents with  . Anxiety    Pt discussed anxiety pt has been doing well, keeping busy gardening, doing okay. Does report needing refills today   . Follow-up    Pt also reports feeling much better following up from January. Pt doing well     HPI Virginia Cabrera presents for follow up  Feeling much better than in January when she had apparently infectious nvd that lasted for over one week and left her feeling down for the better part of a month.  Been keeping busy and staying active. Alinda Money has moved in next door which is good news for her, feeling much more secure knowing he is nearby. Has sold his house and the kennel, which have been big stressors off of her shoulders. Also now has insurance which has helped her feel more ready to take on her orthopedic concerns.  Needs refills today. No concerns with any meds. Vitals reassuring.  Past Medical History:  Diagnosis Date  . Allergy   . Anxiety   . Arthritis   . Asthma   . Depression   . Hypertension     Past Surgical History:  Procedure Laterality Date  . CESAREAN SECTION    . TONSILLECTOMY      Family History  Problem Relation Age of Onset  . Breast cancer Mother   . Brain cancer Father   . Breast cancer Maternal Aunt   . Stroke Maternal Aunt   . Hypertension Sister   . Diabetes Brother   . Kidney disease Brother   . Hypertension Brother     Social History   Socioeconomic History  . Marital status: Single    Spouse name: Not on file  . Number of children: 1  . Years of education: Not on file  . Highest education level: 12th grade  Occupational History  . Occupation: Groomer  Tobacco Use  . Smoking status: Former Smoker    Packs/day: 0.20    Years: 15.00    Pack years: 3.00    Types: Cigarettes    Start date: 08/24/1983    Quit date:  08/24/1998    Years since quitting: 21.8  . Smokeless tobacco: Never Used  Vaping Use  . Vaping Use: Never used  Substance and Sexual Activity  . Alcohol use: Yes    Comment: occssionally  . Drug use: No  . Sexual activity: Not on file  Other Topics Concern  . Not on file  Social History Narrative  . Not on file   Social Determinants of Health   Financial Resource Strain: Not on file  Food Insecurity: Not on file  Transportation Needs: Not on file  Physical Activity: Not on file  Stress: Not on file  Social Connections: Not on file  Intimate Partner Violence: Not on file    Outpatient Medications Prior to Visit  Medication Sig Dispense Refill  . celecoxib (CELEBREX) 200 MG capsule TAKE 1 CAPSULE BY MOUTH ONCE DAILY FOR 30 DAYS    . clonazePAM (KLONOPIN) 0.5 MG tablet Take 1 tablet (0.5 mg total) by mouth 2 (two) times daily as needed for anxiety. 60 tablet 1  . dexamethasone (DECADRON) 1 MG tablet Take 1 tablet (1 mg total) by mouth 2 (two) times daily with a meal. 20 tablet 0  . escitalopram (LEXAPRO) 20 MG  tablet Take 20 mg by mouth at bedtime.    . traZODone (DESYREL) 100 MG tablet TK 1 T PO HS PRN    . Vitamin D, Ergocalciferol, (DRISDOL) 1.25 MG (50000 UNIT) CAPS capsule Take 1 capsule (50,000 Units total) by mouth every 7 (seven) days. (Patient not taking: Reported on 03/28/2020) 8 capsule 0  . albuterol (VENTOLIN HFA) 108 (90 Base) MCG/ACT inhaler INHALE 1 PUFF BY MOUTH EVERY 4 TO 6 HOURS AS NEEDED 18 g 2  . amLODipine (NORVASC) 5 MG tablet Take 1 tablet (5 mg total) by mouth daily. 90 tablet 3  . budesonide-formoterol (SYMBICORT) 160-4.5 MCG/ACT inhaler Inhale 2 puffs into the lungs 2 (two) times daily. 1 Inhaler 3  . esomeprazole (NEXIUM) 40 MG capsule Take 1 capsule (40 mg total) by mouth daily at 12 noon. 90 capsule 3  . losartan (COZAAR) 100 MG tablet Take 1 tablet (100 mg total) by mouth daily. 90 tablet 3  . meclizine (ANTIVERT) 25 MG tablet Take 1 tablet (25 mg  total) by mouth 3 (three) times daily as needed for dizziness. 30 tablet 0  . methocarbamol (ROBAXIN) 500 MG tablet Take 1 tablet (500 mg total) by mouth 4 (four) times daily. 60 tablet 0  . spironolactone (ALDACTONE) 25 MG tablet Take 1 tablet (25 mg total) by mouth daily. 90 tablet 3  . sucralfate (CARAFATE) 1 g tablet Take 1 tablet (1 g total) by mouth 4 (four) times daily -  with meals and at bedtime. 56 tablet 1  . traMADol (ULTRAM) 50 MG tablet Take 1 tablet by mouth twice daily 60 tablet 0   No facility-administered medications prior to visit.    Allergies  Allergen Reactions  . Morphine Other (See Comments)    Causes "coma"  . Morphine And Related     ROS Review of Systems  Constitutional: Negative.   HENT: Negative.   Eyes: Negative.   Respiratory: Negative.   Cardiovascular: Negative.   Gastrointestinal: Negative.   Genitourinary: Negative.   Musculoskeletal: Negative.   Skin: Negative.   Neurological: Negative.   Psychiatric/Behavioral: Negative.   All other systems reviewed and are negative.     Objective:    Physical Exam Vitals and nursing note reviewed.  Constitutional:      General: She is not in acute distress.    Appearance: Normal appearance. She is normal weight. She is not ill-appearing, toxic-appearing or diaphoretic.  Cardiovascular:     Rate and Rhythm: Normal rate and regular rhythm.     Heart sounds: Normal heart sounds. No murmur heard. No friction rub. No gallop.   Pulmonary:     Effort: Pulmonary effort is normal. No respiratory distress.     Breath sounds: Normal breath sounds. No stridor. No wheezing, rhonchi or rales.  Chest:     Chest wall: No tenderness.  Skin:    General: Skin is warm and dry.  Neurological:     General: No focal deficit present.     Mental Status: She is alert and oriented to person, place, and time. Mental status is at baseline.  Psychiatric:        Mood and Affect: Mood normal.        Behavior: Behavior  normal.        Thought Content: Thought content normal.        Judgment: Judgment normal.     BP 128/76   Pulse 77   Temp 98.2 F (36.8 C) (Temporal)   Resp 17  Ht 5\' 3"  (1.6 m)   Wt 225 lb 6.4 oz (102.2 kg)   SpO2 99%   BMI 39.93 kg/m  Wt Readings from Last 3 Encounters:  06/19/20 225 lb 6.4 oz (102.2 kg)  01/24/20 227 lb (103 kg)  01/03/20 228 lb (103.4 kg)     There are no preventive care reminders to display for this patient.  There are no preventive care reminders to display for this patient.  Lab Results  Component Value Date   TSH 0.771 10/10/2019   Lab Results  Component Value Date   WBC 6.4 06/19/2020   HGB 13.8 06/19/2020   HCT 40.8 06/19/2020   MCV 90.5 06/19/2020   PLT 215.0 06/19/2020   Lab Results  Component Value Date   NA 138 06/19/2020   K 4.1 06/19/2020   CO2 25 06/19/2020   GLUCOSE 79 06/19/2020   BUN 16 06/19/2020   CREATININE 1.12 06/19/2020   BILITOT 0.5 06/19/2020   ALKPHOS 62 06/19/2020   AST 34 06/19/2020   ALT 31 06/19/2020   PROT 7.7 06/19/2020   ALBUMIN 4.3 06/19/2020   CALCIUM 9.8 06/19/2020   GFR 52.75 (L) 06/19/2020   Lab Results  Component Value Date   CHOL 194 06/19/2020   Lab Results  Component Value Date   HDL 50.30 06/19/2020   Lab Results  Component Value Date   LDLCALC 108 (H) 06/19/2020   Lab Results  Component Value Date   TRIG 179.0 (H) 06/19/2020   Lab Results  Component Value Date   CHOLHDL 4 06/19/2020   Lab Results  Component Value Date   HGBA1C 5.6 06/19/2020      Assessment & Plan:   Problem List Items Addressed This Visit      Cardiovascular and Mediastinum   Essential hypertension   Relevant Medications   amLODipine (NORVASC) 5 MG tablet   spironolactone (ALDACTONE) 25 MG tablet   losartan (COZAAR) 100 MG tablet     Digestive   Gastroesophageal reflux disease   Relevant Medications   esomeprazole (NEXIUM) 40 MG capsule   meclizine (ANTIVERT) 25 MG tablet   sucralfate  (CARAFATE) 1 g tablet    Other Visit Diagnoses    Screening for endocrine, metabolic and immunity disorder    -  Primary   Relevant Orders   CBC with Differential/Platelet (Completed)   Comprehensive metabolic panel (Completed)   Hemoglobin A1c (Completed)   Leg cramping       Relevant Medications   methocarbamol (ROBAXIN) 500 MG tablet   History of hypokalemia       Relevant Medications   spironolactone (ALDACTONE) 25 MG tablet   Dizziness       Relevant Medications   meclizine (ANTIVERT) 25 MG tablet   Shortness of breath       Relevant Medications   albuterol (VENTOLIN HFA) 108 (90 Base) MCG/ACT inhaler   budesonide-formoterol (SYMBICORT) 160-4.5 MCG/ACT inhaler   fluticasone (FLOVENT HFA) 220 MCG/ACT inhaler   Generalized anxiety disorder       Osteoarthritis of left knee, unspecified osteoarthritis type       Relevant Medications   methocarbamol (ROBAXIN) 500 MG tablet   traMADol (ULTRAM) 50 MG tablet   Lipid screening       Relevant Orders   Lipid panel (Completed)   Vitamin D deficiency       Relevant Orders   Vitamin D (25 hydroxy) (Completed)      Meds ordered this encounter  Medications  . methocarbamol (ROBAXIN)  500 MG tablet    Sig: Take 1 tablet (500 mg total) by mouth 4 (four) times daily.    Dispense:  60 tablet    Refill:  0    Order Specific Question:   Supervising Provider    Answer:   Neva Seat, JEFFREY R [[id number]]  . amLODipine (NORVASC) 5 MG tablet    Sig: Take 1 tablet (5 mg total) by mouth daily.    Dispense:  90 tablet    Refill:  3    Order Specific Question:   Supervising Provider    Answer:   Neva Seat, JEFFREY R [[id number]]  . esomeprazole (NEXIUM) 40 MG capsule    Sig: Take 1 capsule (40 mg total) by mouth daily at 12 noon.    Dispense:  90 capsule    Refill:  3    Order Specific Question:   Supervising Provider    Answer:   Neva Seat, JEFFREY R [[id number]]  . spironolactone (ALDACTONE) 25 MG tablet    Sig: Take 1 tablet (25 mg total) by mouth daily.     Dispense:  90 tablet    Refill:  3    Order Specific Question:   Supervising Provider    Answer:   Neva Seat, JEFFREY R [[id number]]  . meclizine (ANTIVERT) 25 MG tablet    Sig: Take 1 tablet (25 mg total) by mouth 3 (three) times daily as needed for dizziness.    Dispense:  30 tablet    Refill:  0    Order Specific Question:   Supervising Provider    Answer:   Neva Seat, JEFFREY R [[id number]]  . losartan (COZAAR) 100 MG tablet    Sig: Take 1 tablet (100 mg total) by mouth daily.    Dispense:  90 tablet    Refill:  3    Order Specific Question:   Supervising Provider    Answer:   Neva Seat, JEFFREY R [[id number]]  . sucralfate (CARAFATE) 1 g tablet    Sig: Take 1 tablet (1 g total) by mouth 4 (four) times daily -  with meals and at bedtime.    Dispense:  56 tablet    Refill:  1    Order Specific Question:   Supervising Provider    Answer:   Neva Seat, JEFFREY R [[id number]]  . albuterol (VENTOLIN HFA) 108 (90 Base) MCG/ACT inhaler    Sig: INHALE 1 PUFF BY MOUTH EVERY 4 TO 6 HOURS AS NEEDED    Dispense:  18 g    Refill:  2    Order Specific Question:   Supervising Provider    Answer:   Neva Seat, JEFFREY R [[id number]]  . budesonide-formoterol (SYMBICORT) 160-4.5 MCG/ACT inhaler    Sig: Inhale 2 puffs into the lungs 2 (two) times daily.    Dispense:  1 each    Refill:  3    Order Specific Question:   Supervising Provider    Answer:   Neva Seat, JEFFREY R [[id number]]  . traMADol (ULTRAM) 50 MG tablet    Sig: Take 1 tablet (50 mg total) by mouth every 12 (twelve) hours as needed.    Dispense:  60 tablet    Refill:  0    Order Specific Question:   Supervising Provider    Answer:   Neva Seat, JEFFREY R [[id number]]  . fluticasone (FLOVENT HFA) 220 MCG/ACT inhaler    Sig: Inhale 2 puffs into the lungs in the morning and at bedtime.    Dispense:  3 each    Refill:  12  Order Specific Question:   Supervising Provider    Answer:   Neva Seat, JEFFREY R [[id number]]    Follow-up: No follow-ups on file.   PLAN  Labs collected. Will follow up  with the patient as warranted.  Refills as above  Return in 6 mo  Patient encouraged to call clinic with any questions, comments, or concerns.  Janeece Agee, NP

## 2020-07-11 ENCOUNTER — Other Ambulatory Visit: Payer: Self-pay | Admitting: Registered Nurse

## 2020-07-11 DIAGNOSIS — B351 Tinea unguium: Secondary | ICD-10-CM

## 2020-07-11 MED ORDER — TERBINAFINE HCL 250 MG PO TABS: 250.0000 mg | ORAL_TABLET | Freq: Every day | ORAL | 0 refills | Status: DC

## 2020-09-03 ENCOUNTER — Other Ambulatory Visit: Payer: Self-pay

## 2020-09-03 ENCOUNTER — Encounter: Payer: Self-pay | Admitting: Registered Nurse

## 2020-09-03 ENCOUNTER — Ambulatory Visit (INDEPENDENT_AMBULATORY_CARE_PROVIDER_SITE_OTHER): Payer: Medicare (Managed Care) | Admitting: Registered Nurse

## 2020-09-03 VITALS — BP 149/70 | HR 71 | Temp 98.0°F | Resp 18 | Ht 63.0 in | Wt 225.0 lb

## 2020-09-03 DIAGNOSIS — F419 Anxiety disorder, unspecified: Secondary | ICD-10-CM | POA: Diagnosis not present

## 2020-09-03 DIAGNOSIS — M1712 Unilateral primary osteoarthritis, left knee: Secondary | ICD-10-CM | POA: Diagnosis not present

## 2020-09-03 MED ORDER — TRAMADOL HCL 50 MG PO TABS: 50.0000 mg | ORAL_TABLET | Freq: Two times a day (BID) | ORAL | 0 refills | Status: DC | PRN

## 2020-09-03 MED ORDER — ALPRAZOLAM 0.5 MG PO TBDP: 0.5000 mg | ORAL_TABLET | Freq: Every day | ORAL | 0 refills | Status: DC | PRN

## 2020-09-03 NOTE — Patient Instructions (Addendum)
Ms. Charline Hoskinson to see you, as always  Taking alprazolam sublingual dissolving tablets 30-40 minutes before your procedure can help. I would recommend a dose of 0.5mg , but going up to 1mg  would be ok. This can be sedative. Use with caution when driving, drinking, or using other sedatives.  Good luck!  Thank you  Rich     If you have lab work done today you will be contacted with your lab results within the next 2 weeks.  If you have not heard from then please contact us. The fastest way to get your results is to register for My Chart.   IF you received an x-ray today, you will receive an invoice from [facility]. Please contact [facility] at [phone] with questions or concerns regarding your invoice.   IF you received labwork today, you will receive an invoice from [facility]. Please contact [facility] at [phone] with questions or concerns regarding your invoice.   Our billing staff will not be able to assist you with questions regarding bills from these companies.  You will be contacted with the lab results as soon as they are available. The fastest way to get your results is to activate your My Chart account. Instructions are located on the last page of this paperwork. If you have not heard from [phone] regarding the results in 2 weeks, please contact this office.

## 2020-09-03 NOTE — Progress Notes (Signed)
Established Patient Office Visit  Subjective:  Patient ID: Virginia Cabrera, female    DOB: [birth year]  Age: 65 y.o. MRN: [id number]  CC:  Chief Complaint  Patient presents with   Medication Management    Patient states she would like to know if she could get some medication for sedation for a tooth being removed.    HPI Virginia Cabrera presents for medication management  Has anxiety around the dentist. Has upcoming appt for tooth removal Interested in anxiolytic for relief. Has tolerated benzodiazepines well in the past, no Aes  Continuing on lexapro, no concerns. Doing well.   Past Medical History:  Diagnosis Date   Allergy    Anxiety    Arthritis    Asthma    Depression    Hypertension     Past Surgical History:  Procedure Laterality Date   CESAREAN SECTION     TONSILLECTOMY      Family History  Problem Relation Age of Onset   Breast cancer Mother    Brain cancer Father    Breast cancer Maternal Aunt    Stroke Maternal Aunt    Hypertension Sister    Diabetes Brother    Kidney disease Brother    Hypertension Brother     Social History   Socioeconomic History   Marital status: Single    Spouse name: Not on file   Number of children: 1   Years of education: Not on file   Highest education level: 12th grade  Occupational History   Occupation: Groomer  Tobacco Use   Smoking status: Former    Packs/day: 0.20    Years: 15.00    Pack years: 3.00    Types: Cigarettes    Start date: 08/24/1983    Quit date: 08/24/1998    Years since quitting: 22.0   Smokeless tobacco: Never  Vaping Use   Vaping Use: Never used  Substance and Sexual Activity   Alcohol use: Yes    Comment: occssionally   Drug use: No   Sexual activity: Not on file  Other Topics Concern   Not on file  Social History Narrative   Not on file   Social Determinants of Health   Financial Resource Strain: Not on file  Food Insecurity: Not on file  Transportation Needs: Not on file   Physical Activity: Not on file  Stress: Not on file  Social Connections: Not on file  Intimate Partner Violence: Not on file    Outpatient Medications Prior to Visit  Medication Sig Dispense Refill   albuterol (VENTOLIN HFA) 108 (90 Base) MCG/ACT inhaler INHALE 1 PUFF BY MOUTH EVERY 4 TO 6 HOURS AS NEEDED 18 g 2   amLODipine (NORVASC) 5 MG tablet Take 1 tablet (5 mg total) by mouth daily. 90 tablet 3   budesonide-formoterol (SYMBICORT) 160-4.5 MCG/ACT inhaler Inhale 2 puffs into the lungs 2 (two) times daily. 1 each 3   celecoxib (CELEBREX) 200 MG capsule TAKE 1 CAPSULE BY MOUTH ONCE DAILY FOR 30 DAYS     clonazePAM (KLONOPIN) 0.5 MG tablet Take 1 tablet (0.5 mg total) by mouth 2 (two) times daily as needed for anxiety. 60 tablet 1   dexamethasone (DECADRON) 1 MG tablet Take 1 tablet (1 mg total) by mouth 2 (two) times daily with a meal. 20 tablet 0   escitalopram (LEXAPRO) 20 MG tablet Take 20 mg by mouth at bedtime.     esomeprazole (NEXIUM) 40 MG capsule Take 1 capsule (40 mg total)  by mouth daily at 12 noon. 90 capsule 3   fluticasone (FLOVENT HFA) 220 MCG/ACT inhaler Inhale 2 puffs into the lungs in the morning and at bedtime. 3 each 12   losartan (COZAAR) 100 MG tablet Take 1 tablet (100 mg total) by mouth daily. 90 tablet 3   meclizine (ANTIVERT) 25 MG tablet Take 1 tablet (25 mg total) by mouth 3 (three) times daily as needed for dizziness. 30 tablet 0   methocarbamol (ROBAXIN) 500 MG tablet Take 1 tablet (500 mg total) by mouth 4 (four) times daily. 60 tablet 0   spironolactone (ALDACTONE) 25 MG tablet Take 1 tablet (25 mg total) by mouth daily. 90 tablet 3   sucralfate (CARAFATE) 1 g tablet Take 1 tablet (1 g total) by mouth 4 (four) times daily -  with meals and at bedtime. 56 tablet 1   terbinafine (LAMISIL) 250 MG tablet Take 1 tablet (250 mg total) by mouth daily. 84 tablet 0   traMADol (ULTRAM) 50 MG tablet Take 1 tablet (50 mg total) by mouth every 12 (twelve) hours as  needed. 60 tablet 0   traZODone (DESYREL) 100 MG tablet TK 1 T PO HS PRN     Vitamin D, Ergocalciferol, (DRISDOL) 1.25 MG (50000 UNIT) CAPS capsule Take 1 capsule (50,000 Units total) by mouth every 7 (seven) days. 8 capsule 0   No facility-administered medications prior to visit.    Allergies  Allergen Reactions   Morphine Other (See Comments)    Causes "coma"   Morphine And Related     ROS Review of Systems  Constitutional: Negative.   HENT: Negative.    Eyes: Negative.   Respiratory: Negative.    Cardiovascular: Negative.   Gastrointestinal: Negative.   Genitourinary: Negative.   Musculoskeletal: Negative.   Skin: Negative.   Neurological: Negative.   Psychiatric/Behavioral: Negative.    All other systems reviewed and are negative.    Objective:    Physical Exam Vitals and nursing note reviewed.  Constitutional:      General: She is not in acute distress.    Appearance: Normal appearance. She is normal weight. She is not ill-appearing, toxic-appearing or diaphoretic.  Cardiovascular:     Rate and Rhythm: Normal rate and regular rhythm.     Heart sounds: Normal heart sounds. No murmur heard.   No friction rub. No gallop.  Pulmonary:     Effort: Pulmonary effort is normal. No respiratory distress.     Breath sounds: Normal breath sounds. No stridor. No wheezing, rhonchi or rales.  Chest:     Chest wall: No tenderness.  Skin:    General: Skin is warm and dry.  Neurological:     General: No focal deficit present.     Mental Status: She is alert and oriented to person, place, and time. Mental status is at baseline.  Psychiatric:        Mood and Affect: Mood normal.        Behavior: Behavior normal.        Thought Content: Thought content normal.        Judgment: Judgment normal.    BP (!) 149/70   Pulse 71   Temp 98 F (36.7 C) (Temporal)   Resp 18   Ht 5\' 3"  (1.6 m)   Wt 225 lb (102.1 kg)   SpO2 99%   BMI 39.86 kg/m  Wt Readings from Last 3  Encounters:  09/03/20 225 lb (102.1 kg)  06/19/20 225 lb 6.4 oz (102.2  kg)  01/24/20 227 lb (103 kg)     There are no preventive care reminders to display for this patient.  There are no preventive care reminders to display for this patient.  Lab Results  Component Value Date   TSH 0.771 10/10/2019   Lab Results  Component Value Date   WBC 6.4 06/19/2020   HGB 13.8 06/19/2020   HCT 40.8 06/19/2020   MCV 90.5 06/19/2020   PLT 215.0 06/19/2020   Lab Results  Component Value Date   NA 138 06/19/2020   K 4.1 06/19/2020   CO2 25 06/19/2020   GLUCOSE 79 06/19/2020   BUN 16 06/19/2020   CREATININE 1.12 06/19/2020   BILITOT 0.5 06/19/2020   ALKPHOS 62 06/19/2020   AST 34 06/19/2020   ALT 31 06/19/2020   PROT 7.7 06/19/2020   ALBUMIN 4.3 06/19/2020   CALCIUM 9.8 06/19/2020   GFR 52.75 (L) 06/19/2020   Lab Results  Component Value Date   CHOL 194 06/19/2020   Lab Results  Component Value Date   HDL 50.30 06/19/2020   Lab Results  Component Value Date   LDLCALC 108 (H) 06/19/2020   Lab Results  Component Value Date   TRIG 179.0 (H) 06/19/2020   Lab Results  Component Value Date   CHOLHDL 4 06/19/2020   Lab Results  Component Value Date   HGBA1C 5.6 06/19/2020      Assessment & Plan:   Problem List Items Addressed This Visit   None Visit Diagnoses     Anxiety due to invasive procedure    -  Primary   Relevant Medications   ALPRAZolam (NIRAVAM) 0.5 MG dissolvable tablet   Osteoarthritis of left knee, unspecified osteoarthritis type       Relevant Medications   traMADol (ULTRAM) 50 MG tablet       No orders of the defined types were placed in this encounter.   Follow-up: No follow-ups on file.   PLAN Alprazolam sublingual 0.5-1mg  taken 30-62m before procedure if okayed by dentist. Reviewed r/b/se of this medication Tramadol refilled per pt request. Uses sparingly. Reviewed r/b/se of this medication Patient encouraged to call clinic with any  questions, comments, or concerns.   Janeece Agee, NP

## 2021-01-04 ENCOUNTER — Encounter: Payer: Self-pay | Admitting: Registered Nurse

## 2021-01-04 ENCOUNTER — Ambulatory Visit (INDEPENDENT_AMBULATORY_CARE_PROVIDER_SITE_OTHER): Payer: Medicare (Managed Care) | Admitting: Registered Nurse

## 2021-01-04 ENCOUNTER — Other Ambulatory Visit: Payer: Self-pay

## 2021-01-04 VITALS — BP 133/70 | HR 80 | Temp 98.1°F | Resp 18 | Wt 223.2 lb

## 2021-01-04 DIAGNOSIS — R944 Abnormal results of kidney function studies: Secondary | ICD-10-CM

## 2021-01-04 DIAGNOSIS — R0602 Shortness of breath: Secondary | ICD-10-CM

## 2021-01-04 DIAGNOSIS — Z8639 Personal history of other endocrine, nutritional and metabolic disease: Secondary | ICD-10-CM

## 2021-01-04 DIAGNOSIS — Z1211 Encounter for screening for malignant neoplasm of colon: Secondary | ICD-10-CM

## 2021-01-04 DIAGNOSIS — R21 Rash and other nonspecific skin eruption: Secondary | ICD-10-CM

## 2021-01-04 DIAGNOSIS — E559 Vitamin D deficiency, unspecified: Secondary | ICD-10-CM | POA: Diagnosis not present

## 2021-01-04 DIAGNOSIS — K5904 Chronic idiopathic constipation: Secondary | ICD-10-CM

## 2021-01-04 DIAGNOSIS — E782 Mixed hyperlipidemia: Secondary | ICD-10-CM | POA: Diagnosis not present

## 2021-01-04 DIAGNOSIS — I10 Essential (primary) hypertension: Secondary | ICD-10-CM

## 2021-01-04 DIAGNOSIS — K219 Gastro-esophageal reflux disease without esophagitis: Secondary | ICD-10-CM

## 2021-01-04 DIAGNOSIS — B351 Tinea unguium: Secondary | ICD-10-CM

## 2021-01-04 MED ORDER — POLYETHYLENE GLYCOL 3350 17 GM/SCOOP PO POWD: 17.0000 g | Freq: Two times a day (BID) | ORAL | 2 refills | Status: DC | PRN

## 2021-01-04 MED ORDER — PSYLLIUM 0.36 G PO CAPS: 1.0000 | ORAL_CAPSULE | Freq: Every day | ORAL | 3 refills | Status: DC

## 2021-01-04 MED ORDER — ESOMEPRAZOLE MAGNESIUM 40 MG PO CPDR: 40.0000 mg | DELAYED_RELEASE_CAPSULE | Freq: Every day | ORAL | 3 refills | Status: DC

## 2021-01-04 MED ORDER — SPIRONOLACTONE 25 MG PO TABS: 25.0000 mg | ORAL_TABLET | Freq: Every day | ORAL | 3 refills | Status: DC

## 2021-01-04 MED ORDER — AMLODIPINE BESYLATE 5 MG PO TABS: 5.0000 mg | ORAL_TABLET | Freq: Every day | ORAL | 3 refills | Status: DC

## 2021-01-04 MED ORDER — LOSARTAN POTASSIUM 100 MG PO TABS: 100.0000 mg | ORAL_TABLET | Freq: Every day | ORAL | 3 refills | Status: DC

## 2021-01-04 NOTE — Patient Instructions (Addendum)
Ms. Virginia Cabrera to see you  Daily fiber use - psyllium husk Can use polyethylene glycol 1 scoop daily as needed.  Aggressive hydration with close to 1 gallon of water daily Stay active  If worsening or failing to improve, GI is next step - referral placed.  Meds will be refilled x 6 mo  Thank you  [name]     If you have lab work done today you will be contacted with your lab results within the next 2 weeks.  If you have not heard from [place] then please contact us. The fastest way to get your results is to register for My Chart.   IF you received an x-ray today, you will receive an invoice from [facility]. Please contact [facility] at [phone] with questions or concerns regarding your invoice.   IF you received labwork today, you will receive an invoice from [facility]. Please contact [facility] at [phone] with questions or concerns regarding your invoice.   Our billing staff will not be able to assist you with questions regarding bills from these companies.  You will be contacted with the lab results as soon as they are available. The fastest way to get your results is to activate your My Chart account. Instructions are located on the last page of this paperwork. If you have not heard from [place] regarding the results in 2 weeks, please contact this office.

## 2021-01-04 NOTE — Progress Notes (Signed)
Established Patient Office Visit  Subjective:  Patient ID: Virginia Cabrera, female    DOB: 09/19/1955  Age: 65 y.o. MRN: [id number]  CC:  Chief Complaint  Patient presents with   Follow-up    Patient is here for 6 month follow up and constipation.    HPI Virginia Cabrera presents for 6 mo follow up   Hx of  Anxiety currently on escitalopram 20mg  po qd. Good effect. Takes alprazolam when having procedures or imaging. Overall stable. HTN taking amlodipine 5mg  po qd, losartan 100mg  po qd, spironolactone 25mg  po qd. Good effect. No AE. Mixed hyperlipidemia controlling with lifestyle. Only mildly abnormal on last labs. Vitamin D deficiency last check in April 2022, at 54. On daily otc supplement.   Acute concern for constipation Takes dulcolax when she needs Good effect Some bloating and discomfort before BM Rectal pain with bm Resolves after Occ diarrhea Overdue for colonoscopy, hx of polyps No blood in stool, blood per rectum, melena, vomiting, or unexpected weight changes.  Past Medical History:  Diagnosis Date   Allergy    Anxiety    Arthritis    Asthma    Depression    Hypertension     Past Surgical History:  Procedure Laterality Date   CESAREAN SECTION     TONSILLECTOMY      Family History  Problem Relation Age of Onset   Breast cancer Mother    Brain cancer Father    Breast cancer Maternal Aunt    Stroke Maternal Aunt    Hypertension Sister    Diabetes Brother    Kidney disease Brother    Hypertension Brother     Social History   Socioeconomic History   Marital status: Single    Spouse name: Not on file   Number of children: 1   Years of education: Not on file   Highest education level: 12th grade  Occupational History   Occupation: Groomer  Tobacco Use   Smoking status: Former    Packs/day: 0.20    Years: 15.00    Pack years: 3.00    Types: Cigarettes    Start date: 08/24/1983    Quit date: 08/24/1998    Years since quitting: 22.3    Smokeless tobacco: Never  Vaping Use   Vaping Use: Never used  Substance and Sexual Activity   Alcohol use: Yes    Comment: occssionally   Drug use: No   Sexual activity: Not on file  Other Topics Concern   Not on file  Social History Narrative   Not on file   Social Determinants of Health   Financial Resource Strain: Not on file  Food Insecurity: Not on file  Transportation Needs: Not on file  Physical Activity: Not on file  Stress: Not on file  Social Connections: Not on file  Intimate Partner Violence: Not on file    Outpatient Medications Prior to Visit  Medication Sig Dispense Refill   albuterol (VENTOLIN HFA) 108 (90 Base) MCG/ACT inhaler INHALE 1 PUFF BY MOUTH EVERY 4 TO 6 HOURS AS NEEDED 18 g 2   ALPRAZolam (NIRAVAM) 0.5 MG dissolvable tablet Take 1-2 tablets (0.5-1 mg total) by mouth daily as needed for anxiety (pre-procedure). 10 tablet 0   budesonide-formoterol (SYMBICORT) 160-4.5 MCG/ACT inhaler Inhale 2 puffs into the lungs 2 (two) times daily. 1 each 3   celecoxib (CELEBREX) 200 MG capsule TAKE 1 CAPSULE BY MOUTH ONCE DAILY FOR 30 DAYS     dexamethasone (DECADRON) 1 MG  tablet Take 1 tablet (1 mg total) by mouth 2 (two) times daily with a meal. 20 tablet 0   escitalopram (LEXAPRO) 20 MG tablet Take 20 mg by mouth at bedtime.     fluticasone (FLOVENT HFA) 220 MCG/ACT inhaler Inhale 2 puffs into the lungs in the morning and at bedtime. 3 each 12   meclizine (ANTIVERT) 25 MG tablet Take 1 tablet (25 mg total) by mouth 3 (three) times daily as needed for dizziness. 30 tablet 0   methocarbamol (ROBAXIN) 500 MG tablet Take 1 tablet (500 mg total) by mouth 4 (four) times daily. 60 tablet 0   sucralfate (CARAFATE) 1 g tablet Take 1 tablet (1 g total) by mouth 4 (four) times daily -  with meals and at bedtime. 56 tablet 1   terbinafine (LAMISIL) 250 MG tablet Take 1 tablet (250 mg total) by mouth daily. 84 tablet 0   traMADol (ULTRAM) 50 MG tablet Take 1 tablet (50 mg total)  by mouth every 12 (twelve) hours as needed. 60 tablet 0   traZODone (DESYREL) 100 MG tablet TK 1 T PO HS PRN     Vitamin D, Ergocalciferol, (DRISDOL) 1.25 MG (50000 UNIT) CAPS capsule Take 1 capsule (50,000 Units total) by mouth every 7 (seven) days. 8 capsule 0   amLODipine (NORVASC) 5 MG tablet Take 1 tablet (5 mg total) by mouth daily. 90 tablet 3   clonazePAM (KLONOPIN) 0.5 MG tablet Take 1 tablet (0.5 mg total) by mouth 2 (two) times daily as needed for anxiety. 60 tablet 1   esomeprazole (NEXIUM) 40 MG capsule Take 1 capsule (40 mg total) by mouth daily at 12 noon. 90 capsule 3   losartan (COZAAR) 100 MG tablet Take 1 tablet (100 mg total) by mouth daily. 90 tablet 3   spironolactone (ALDACTONE) 25 MG tablet Take 1 tablet (25 mg total) by mouth daily. 90 tablet 3   No facility-administered medications prior to visit.    Allergies  Allergen Reactions   Morphine Other (See Comments)    Causes "coma"   Morphine And Related     ROS Review of Systems  Constitutional: Negative.   HENT: Negative.    Eyes: Negative.   Respiratory: Negative.    Cardiovascular: Negative.   Gastrointestinal:  Positive for abdominal pain and constipation. Negative for abdominal distention, anal bleeding, blood in stool, diarrhea, nausea, rectal pain and vomiting.  Genitourinary: Negative.   Musculoskeletal: Negative.   Skin: Negative.   Neurological: Negative.   Psychiatric/Behavioral: Negative.    All other systems reviewed and are negative.    Objective:    Physical Exam Vitals and nursing note reviewed.  Constitutional:      General: She is not in acute distress.    Appearance: Normal appearance. She is normal weight. She is not ill-appearing, toxic-appearing or diaphoretic.  Cardiovascular:     Rate and Rhythm: Normal rate and regular rhythm.     Heart sounds: Normal heart sounds. No murmur heard.   No friction rub. No gallop.  Pulmonary:     Effort: Pulmonary effort is normal. No  respiratory distress.     Breath sounds: Normal breath sounds. No stridor. No wheezing, rhonchi or rales.  Chest:     Chest wall: No tenderness.  Skin:    General: Skin is warm and dry.  Neurological:     General: No focal deficit present.     Mental Status: She is alert and oriented to person, place, and time. Mental status is  at baseline.  Psychiatric:        Mood and Affect: Mood normal.        Behavior: Behavior normal.        Thought Content: Thought content normal.        Judgment: Judgment normal.    BP 133/70   Pulse 80   Temp 98.1 F (36.7 C) (Temporal)   Resp 18   Wt 223 lb 3.2 oz (101.2 kg)   SpO2 100%   BMI 39.54 kg/m  Wt Readings from Last 3 Encounters:  01/04/21 223 lb 3.2 oz (101.2 kg)  09/03/20 225 lb (102.1 kg)  06/19/20 225 lb 6.4 oz (102.2 kg)     Health Maintenance Due  Topic Date Due   COVID-19 Vaccine (1) Never done   Zoster Vaccines- Shingrix (1 of 2) Never done   INFLUENZA VACCINE  Never done   MAMMOGRAM  10/11/2020    There are no preventive care reminders to display for this patient.  Lab Results  Component Value Date   TSH 0.771 10/10/2019   Lab Results  Component Value Date   WBC 6.4 06/19/2020   HGB 13.8 06/19/2020   HCT 40.8 06/19/2020   MCV 90.5 06/19/2020   PLT 215.0 06/19/2020   Lab Results  Component Value Date   NA 138 06/19/2020   K 4.1 06/19/2020   CO2 25 06/19/2020   GLUCOSE 79 06/19/2020   BUN 16 06/19/2020   CREATININE 1.12 06/19/2020   BILITOT 0.5 06/19/2020   ALKPHOS 62 06/19/2020   AST 34 06/19/2020   ALT 31 06/19/2020   PROT 7.7 06/19/2020   ALBUMIN 4.3 06/19/2020   CALCIUM 9.8 06/19/2020   GFR 52.75 (L) 06/19/2020   Lab Results  Component Value Date   CHOL 194 06/19/2020   Lab Results  Component Value Date   HDL 50.30 06/19/2020   Lab Results  Component Value Date   LDLCALC 108 (H) 06/19/2020   Lab Results  Component Value Date   TRIG 179.0 (H) 06/19/2020   Lab Results  Component Value  Date   CHOLHDL 4 06/19/2020   Lab Results  Component Value Date   HGBA1C 5.6 06/19/2020      Assessment & Plan:   Problem List Items Addressed This Visit       Cardiovascular and Mediastinum   Essential hypertension   Relevant Medications   losartan (COZAAR) 100 MG tablet   spironolactone (ALDACTONE) 25 MG tablet   amLODipine (NORVASC) 5 MG tablet     Digestive   Gastroesophageal reflux disease   Relevant Medications   Psyllium 0.36 g CAPS   polyethylene glycol powder (GLYCOLAX/MIRALAX) 17 GM/SCOOP powder   esomeprazole (NEXIUM) 40 MG capsule   Other Visit Diagnoses     Decreased GFR    -  Primary   Relevant Orders   Comprehensive metabolic panel   Mixed hyperlipidemia       Relevant Medications   losartan (COZAAR) 100 MG tablet   spironolactone (ALDACTONE) 25 MG tablet   amLODipine (NORVASC) 5 MG tablet   Other Relevant Orders   Lipid panel   Rash and nonspecific skin eruption       Vitamin D deficiency       Relevant Orders   Vitamin D (25 hydroxy)   Chronic idiopathic constipation       Relevant Medications   Psyllium 0.36 g CAPS   polyethylene glycol powder (GLYCOLAX/MIRALAX) 17 GM/SCOOP powder   Colon cancer screening  Relevant Orders   Ambulatory referral to Gastroenterology   Shortness of breath       Onychomycosis       History of hypokalemia       Relevant Medications   spironolactone (ALDACTONE) 25 MG tablet       Meds ordered this encounter  Medications   Psyllium 0.36 g CAPS    Sig: Take 1 capsule by mouth daily.    Dispense:  90 capsule    Refill:  3    Order Specific Question:   Supervising Provider    Answer:   Neva Seat, JEFFREY R [[id number]]   polyethylene glycol powder (GLYCOLAX/MIRALAX) 17 GM/SCOOP powder    Sig: Take 17 g by mouth 2 (two) times daily as needed.    Dispense:  6700 g    Refill:  2    Order Specific Question:   Supervising Provider    Answer:   Neva Seat, JEFFREY R [[id number]]   losartan (COZAAR) 100 MG tablet    Sig:  Take 1 tablet (100 mg total) by mouth daily.    Dispense:  90 tablet    Refill:  3    Order Specific Question:   Supervising Provider    Answer:   Neva Seat, JEFFREY R [[id number]]   esomeprazole (NEXIUM) 40 MG capsule    Sig: Take 1 capsule (40 mg total) by mouth daily at 12 noon.    Dispense:  90 capsule    Refill:  3    Order Specific Question:   Supervising Provider    Answer:   Neva Seat, JEFFREY R [[id number]]   spironolactone (ALDACTONE) 25 MG tablet    Sig: Take 1 tablet (25 mg total) by mouth daily.    Dispense:  90 tablet    Refill:  3    Order Specific Question:   Supervising Provider    Answer:   Neva Seat, JEFFREY R [[id number]]   amLODipine (NORVASC) 5 MG tablet    Sig: Take 1 tablet (5 mg total) by mouth daily.    Dispense:  90 tablet    Refill:  3    Order Specific Question:   Supervising Provider    Answer:   Neva Seat, JEFFREY R [[id number]]    Follow-up: Return in about 6 months (around 07/05/2021) for follow up .   PLAN Refill meds Suggest addressing constipation with aggressive hydration, daily fiber supplement, and miralax if needed. Will refer to GI for colonoscopy Labs collected. Will follow up with the patient as warranted. Patient encouraged to call clinic with any questions, comments, or concerns.  Janeece Agee, NP

## 2021-01-04 NOTE — Addendum Note (Signed)
Addended by: Eldred Manges on: 01/04/2021 03:46 PM   Modules accepted: Orders

## 2021-01-07 ENCOUNTER — Other Ambulatory Visit: Payer: Self-pay | Admitting: Registered Nurse

## 2021-01-07 DIAGNOSIS — M1712 Unilateral primary osteoarthritis, left knee: Secondary | ICD-10-CM

## 2021-01-08 ENCOUNTER — Other Ambulatory Visit: Payer: Medicare (Managed Care)

## 2021-01-08 ENCOUNTER — Other Ambulatory Visit: Payer: Self-pay

## 2021-01-08 DIAGNOSIS — E559 Vitamin D deficiency, unspecified: Secondary | ICD-10-CM

## 2021-01-08 DIAGNOSIS — E782 Mixed hyperlipidemia: Secondary | ICD-10-CM

## 2021-01-08 DIAGNOSIS — R944 Abnormal results of kidney function studies: Secondary | ICD-10-CM

## 2021-04-02 ENCOUNTER — Encounter: Payer: Self-pay | Admitting: Registered Nurse

## 2021-04-02 ENCOUNTER — Other Ambulatory Visit: Payer: Self-pay

## 2021-04-02 ENCOUNTER — Ambulatory Visit (INDEPENDENT_AMBULATORY_CARE_PROVIDER_SITE_OTHER): Payer: Medicare Other | Admitting: Registered Nurse

## 2021-04-02 VITALS — BP 146/87 | HR 80 | Temp 98.3°F | Resp 18 | Ht 63.0 in | Wt 217.8 lb

## 2021-04-02 DIAGNOSIS — R0602 Shortness of breath: Secondary | ICD-10-CM

## 2021-04-02 DIAGNOSIS — M25561 Pain in right knee: Secondary | ICD-10-CM | POA: Diagnosis not present

## 2021-04-02 DIAGNOSIS — Z8639 Personal history of other endocrine, nutritional and metabolic disease: Secondary | ICD-10-CM | POA: Diagnosis not present

## 2021-04-02 DIAGNOSIS — I10 Essential (primary) hypertension: Secondary | ICD-10-CM | POA: Diagnosis not present

## 2021-04-02 DIAGNOSIS — M1712 Unilateral primary osteoarthritis, left knee: Secondary | ICD-10-CM | POA: Diagnosis not present

## 2021-04-02 DIAGNOSIS — M25511 Pain in right shoulder: Secondary | ICD-10-CM

## 2021-04-02 DIAGNOSIS — F419 Anxiety disorder, unspecified: Secondary | ICD-10-CM | POA: Diagnosis not present

## 2021-04-02 DIAGNOSIS — G8929 Other chronic pain: Secondary | ICD-10-CM

## 2021-04-02 MED ORDER — ESCITALOPRAM OXALATE 20 MG PO TABS: 20.0000 mg | ORAL_TABLET | Freq: Every day | ORAL | 3 refills | Status: DC

## 2021-04-02 MED ORDER — LOSARTAN POTASSIUM 100 MG PO TABS: 100.0000 mg | ORAL_TABLET | Freq: Every day | ORAL | 3 refills | Status: DC

## 2021-04-02 MED ORDER — FLUTICASONE PROPIONATE HFA 220 MCG/ACT IN AERO: 2.0000 | INHALATION_SPRAY | Freq: Two times a day (BID) | RESPIRATORY_TRACT | 12 refills | Status: DC

## 2021-04-02 MED ORDER — AMLODIPINE BESYLATE 5 MG PO TABS: 5.0000 mg | ORAL_TABLET | Freq: Every day | ORAL | 3 refills | Status: DC

## 2021-04-02 MED ORDER — SPIRONOLACTONE 25 MG PO TABS: 25.0000 mg | ORAL_TABLET | Freq: Every day | ORAL | 3 refills | Status: DC

## 2021-04-02 MED ORDER — TRAMADOL HCL 50 MG PO TABS: 50.0000 mg | ORAL_TABLET | Freq: Two times a day (BID) | ORAL | 0 refills | Status: DC | PRN

## 2021-04-02 NOTE — Progress Notes (Signed)
Established Patient Office Visit  Subjective:  Patient ID: Virginia Cabrera, female    DOB: 02-11-1956  Age: 66 y.o. MRN: [id number]  CC:  Chief Complaint  Patient presents with   Medication Refill    Patient states she is here for a medication refill and and wants to get a cologaurd.    HPI Virginia Cabrera presents for med refill.  No acute concerns  Hypertension: Patient Currently taking: amlodipine 5mg  po qd, spironolactone 25mg  po qd, losartan 100mg  po qd Good effect. No AEs. Denies CV symptoms including: chest pain, shob, doe, headache, visual changes, fatigue, claudication, and dependent edema.   Previous readings and labs: BP Readings from Last 3 Encounters:  04/02/21 (!) 146/87  01/04/21 133/70  09/03/20 (!) 149/70   Lab Results  Component Value Date   CREATININE 1.12 06/19/2020    Asthma Flovent 280mcg 2 puffs 2 times daily. Good effect, no AE. Hopes to continue.   Knee and shoulder pain Ongoing. Needs surgical intervention Hoping to get reesablished with ortho now that she has insurance again Seen Dr. Onnie Graham and Dr. Georgina Snell in the past.   Anxiety Had been on lexapro 20mg  po qhs in the past with good effect. Would like to restart Had fallen out of follow up with psychiatry due to insurance issue.  Health Maintenance Due for colon ca screen Due for mammography Would like orders for both placed   Past Medical History:  Diagnosis Date   Allergy    Anxiety    Arthritis    Asthma    Depression    Hypertension     Past Surgical History:  Procedure Laterality Date   CESAREAN SECTION     TONSILLECTOMY      Family History  Problem Relation Age of Onset   Breast cancer Mother    Brain cancer Father    Breast cancer Maternal Aunt    Stroke Maternal Aunt    Hypertension Sister    Diabetes Brother    Kidney disease Brother    Hypertension Brother     Social History   Socioeconomic History   Marital status: Single    Spouse name: Not  on file   Number of children: 1   Years of education: Not on file   Highest education level: 12th grade  Occupational History   Occupation: Groomer  Tobacco Use   Smoking status: Former    Packs/day: 0.20    Years: 15.00    Pack years: 3.00    Types: Cigarettes    Start date: 08/24/1983    Quit date: 08/24/1998    Years since quitting: 22.6   Smokeless tobacco: Never  Vaping Use   Vaping Use: Never used  Substance and Sexual Activity   Alcohol use: Yes    Comment: occssionally   Drug use: No   Sexual activity: Not on file  Other Topics Concern   Not on file  Social History Narrative   Not on file   Social Determinants of Health   Financial Resource Strain: Not on file  Food Insecurity: Not on file  Transportation Needs: Not on file  Physical Activity: Not on file  Stress: Not on file  Social Connections: Not on file  Intimate Partner Violence: Not on file    Outpatient Medications Prior to Visit  Medication Sig Dispense Refill   albuterol (VENTOLIN HFA) 108 (90 Base) MCG/ACT inhaler INHALE 1 PUFF BY MOUTH EVERY 4 TO 6 HOURS AS NEEDED 18 g 2  ALPRAZolam (NIRAVAM) 0.5 MG dissolvable tablet Take 1-2 tablets (0.5-1 mg total) by mouth daily as needed for anxiety (pre-procedure). 10 tablet 0   budesonide-formoterol (SYMBICORT) 160-4.5 MCG/ACT inhaler Inhale 2 puffs into the lungs 2 (two) times daily. 1 each 3   celecoxib (CELEBREX) 200 MG capsule TAKE 1 CAPSULE BY MOUTH ONCE DAILY FOR 30 DAYS     dexamethasone (DECADRON) 1 MG tablet Take 1 tablet (1 mg total) by mouth 2 (two) times daily with a meal. 20 tablet 0   esomeprazole (NEXIUM) 40 MG capsule Take 1 capsule (40 mg total) by mouth daily at 12 noon. 90 capsule 3   meclizine (ANTIVERT) 25 MG tablet Take 1 tablet (25 mg total) by mouth 3 (three) times daily as needed for dizziness. 30 tablet 0   methocarbamol (ROBAXIN) 500 MG tablet Take 1 tablet (500 mg total) by mouth 4 (four) times daily. 60 tablet 0   polyethylene  glycol powder (GLYCOLAX/MIRALAX) 17 GM/SCOOP powder Take 17 g by mouth 2 (two) times daily as needed. 6700 g 2   Psyllium 0.36 g CAPS Take 1 capsule by mouth daily. 90 capsule 3   sucralfate (CARAFATE) 1 g tablet Take 1 tablet (1 g total) by mouth 4 (four) times daily -  with meals and at bedtime. 56 tablet 1   terbinafine (LAMISIL) 250 MG tablet Take 1 tablet (250 mg total) by mouth daily. 84 tablet 0   traZODone (DESYREL) 100 MG tablet TK 1 T PO HS PRN     Vitamin D, Ergocalciferol, (DRISDOL) 1.25 MG (50000 UNIT) CAPS capsule Take 1 capsule (50,000 Units total) by mouth every 7 (seven) days. 8 capsule 0   amLODipine (NORVASC) 5 MG tablet Take 1 tablet (5 mg total) by mouth daily. 90 tablet 3   escitalopram (LEXAPRO) 20 MG tablet Take 20 mg by mouth at bedtime.     fluticasone (FLOVENT HFA) 220 MCG/ACT inhaler Inhale 2 puffs into the lungs in the morning and at bedtime. 3 each 12   losartan (COZAAR) 100 MG tablet Take 1 tablet (100 mg total) by mouth daily. 90 tablet 3   spironolactone (ALDACTONE) 25 MG tablet Take 1 tablet (25 mg total) by mouth daily. 90 tablet 3   traMADol (ULTRAM) 50 MG tablet TAKE 1 TABLET BY MOUTH EVERY 12 HOURS AS NEEDED 60 tablet 0   No facility-administered medications prior to visit.    Allergies  Allergen Reactions   Morphine Other (See Comments)    Causes "coma"   Morphine And Related     ROS Review of Systems  Constitutional: Negative.   HENT: Negative.    Eyes: Negative.   Respiratory: Negative.    Cardiovascular: Negative.   Gastrointestinal: Negative.   Genitourinary: Negative.   Musculoskeletal: Negative.   Skin: Negative.   Neurological: Negative.   Psychiatric/Behavioral: Negative.    All other systems reviewed and are negative.    Objective:    Physical Exam Vitals and nursing note reviewed.  Constitutional:      General: She is not in acute distress.    Appearance: Normal appearance. She is normal weight. She is not ill-appearing,  toxic-appearing or diaphoretic.  Cardiovascular:     Rate and Rhythm: Normal rate and regular rhythm.     Heart sounds: Normal heart sounds. No murmur heard.   No friction rub. No gallop.  Pulmonary:     Effort: Pulmonary effort is normal. No respiratory distress.     Breath sounds: Normal breath sounds. No stridor.  No wheezing, rhonchi or rales.  Chest:     Chest wall: No tenderness.  Musculoskeletal:     Comments: Limited flexion and extension in R knee.  Limited ROM in all aspects of R shoulder  Skin:    General: Skin is warm and dry.  Neurological:     General: No focal deficit present.     Mental Status: She is alert and oriented to person, place, and time. Mental status is at baseline.  Psychiatric:        Mood and Affect: Mood normal.        Behavior: Behavior normal.        Thought Content: Thought content normal.        Judgment: Judgment normal.    BP (!) 146/87    Pulse 80    Temp 98.3 F (36.8 C) (Temporal)    Resp 18    Ht 5\' 3"  (1.6 m)    Wt 217 lb 12.8 oz (98.8 kg)    SpO2 100%    BMI 38.58 kg/m  Wt Readings from Last 3 Encounters:  04/02/21 217 lb 12.8 oz (98.8 kg)  01/04/21 223 lb 3.2 oz (101.2 kg)  09/03/20 225 lb (102.1 kg)     Health Maintenance Due  Topic Date Due   MAMMOGRAM  10/11/2020    There are no preventive care reminders to display for this patient.  Lab Results  Component Value Date   TSH 0.771 10/10/2019   Lab Results  Component Value Date   WBC 6.4 06/19/2020   HGB 13.8 06/19/2020   HCT 40.8 06/19/2020   MCV 90.5 06/19/2020   PLT 215.0 06/19/2020   Lab Results  Component Value Date   NA 138 06/19/2020   K 4.1 06/19/2020   CO2 25 06/19/2020   GLUCOSE 79 06/19/2020   BUN 16 06/19/2020   CREATININE 1.12 06/19/2020   BILITOT 0.5 06/19/2020   ALKPHOS 62 06/19/2020   AST 34 06/19/2020   ALT 31 06/19/2020   PROT 7.7 06/19/2020   ALBUMIN 4.3 06/19/2020   CALCIUM 9.8 06/19/2020   GFR 52.75 (L) 06/19/2020   Lab Results   Component Value Date   CHOL 194 06/19/2020   Lab Results  Component Value Date   HDL 50.30 06/19/2020   Lab Results  Component Value Date   LDLCALC 108 (H) 06/19/2020   Lab Results  Component Value Date   TRIG 179.0 (H) 06/19/2020   Lab Results  Component Value Date   CHOLHDL 4 06/19/2020   Lab Results  Component Value Date   HGBA1C 5.6 06/19/2020      Assessment & Plan:   Problem List Items Addressed This Visit       Cardiovascular and Mediastinum   Essential hypertension   Relevant Medications   amLODipine (NORVASC) 5 MG tablet   spironolactone (ALDACTONE) 25 MG tablet   losartan (COZAAR) 100 MG tablet   Other Visit Diagnoses     Anxiety    -  Primary   Relevant Medications   escitalopram (LEXAPRO) 20 MG tablet   History of hypokalemia       Relevant Medications   spironolactone (ALDACTONE) 25 MG tablet   Shortness of breath       Relevant Medications   fluticasone (FLOVENT HFA) 220 MCG/ACT inhaler   Osteoarthritis of left knee, unspecified osteoarthritis type       Relevant Medications   traMADol (ULTRAM) 50 MG tablet   Chronic pain in right shoulder  Relevant Medications   traMADol (ULTRAM) 50 MG tablet   escitalopram (LEXAPRO) 20 MG tablet   Other Relevant Orders   AMB referral to orthopedics   Chronic pain of right knee       Relevant Orders   AMB referral to orthopedics       Meds ordered this encounter  Medications   amLODipine (NORVASC) 5 MG tablet    Sig: Take 1 tablet (5 mg total) by mouth daily.    Dispense:  90 tablet    Refill:  3    Order Specific Question:   Supervising Provider    Answer:   Carlota Raspberry, JEFFREY R [[id number]]   spironolactone (ALDACTONE) 25 MG tablet    Sig: Take 1 tablet (25 mg total) by mouth daily.    Dispense:  90 tablet    Refill:  3    Order Specific Question:   Supervising Provider    Answer:   Carlota Raspberry, JEFFREY R [[id number]]   fluticasone (FLOVENT HFA) 220 MCG/ACT inhaler    Sig: Inhale 2 puffs into the  lungs in the morning and at bedtime.    Dispense:  3 each    Refill:  12    Order Specific Question:   Supervising Provider    Answer:   Carlota Raspberry, JEFFREY R [[id number]]   losartan (COZAAR) 100 MG tablet    Sig: Take 1 tablet (100 mg total) by mouth daily.    Dispense:  90 tablet    Refill:  3    Order Specific Question:   Supervising Provider    Answer:   Carlota Raspberry, JEFFREY R [[id number]]   traMADol (ULTRAM) 50 MG tablet    Sig: Take 1 tablet (50 mg total) by mouth every 12 (twelve) hours as needed.    Dispense:  180 tablet    Refill:  0    Order Specific Question:   Supervising Provider    Answer:   Carlota Raspberry, JEFFREY R [[id number]]   escitalopram (LEXAPRO) 20 MG tablet    Sig: Take 1 tablet (20 mg total) by mouth at bedtime.    Dispense:  90 tablet    Refill:  3    Order Specific Question:   Supervising Provider    Answer:   Carlota Raspberry, JEFFREY R [id number]    Follow-up: Return if symptoms worsen or fail to improve, for as scheduled.   PLAN Refill all meds as above Refer to ortho Return as shceduled in May for med check Patient encouraged to call clinic with any questions, comments, or concerns.  Maximiano Coss, NP

## 2021-04-02 NOTE — Patient Instructions (Addendum)
Ms. Virginia Cabrera to see you!  Meds refilled. See you in a few months!  Cologuard sent, mammogram order sent.  Referral to Dr. Rennis Chris sent.  See you in May!  Thanks,  Virginia Cabrera    If you have lab work done today you will be contacted with your lab results within the next 2 weeks.  If you have not heard from [place] then please contact us. The fastest way to get your results is to register for My Chart.   IF you received an x-ray today, you will receive an invoice from [facility]. Please contact [facility] at [phone] with questions or concerns regarding your invoice.   IF you received labwork today, you will receive an invoice from [facility]. Please contact [facility] at [phone] with questions or concerns regarding your invoice.   Our billing staff will not be able to assist you with questions regarding bills from these companies.  You will be contacted with the lab results as soon as they are available. The fastest way to get your results is to activate your My Chart account. Instructions are located on the last page of this paperwork. If you have not heard from [place] regarding the results in 2 weeks, please contact this office.

## 2021-04-27 NOTE — Progress Notes (Signed)
Telemedicine Encounter- SOAP NOTE Established Patient  This telephone encounter was conducted with the patient's (or proxy's) verbal consent via audio telecommunications: yes/no: Yes Patient was instructed to have this encounter in a suitably private space; and to only have persons present to whom they give permission to participate. In addition, patient identity was confirmed by use of name plus two identifiers (DOB and address).  I discussed the limitations, risks, security and privacy concerns of performing an evaluation and management service by telephone and the availability of in person appointments. I also discussed with the patient that there may be a patient responsible charge related to this service. The patient expressed understanding and agreed to proceed.  I spent a total of 13 minutes talking with the patient or their proxy.  Patient at home Provider in office  Participants: Virginia Ruddy, NP and [name]  Chief Complaint  Patient presents with   Diarrhea    Patient states on Sunday she had some severe diarrhea and on Monday and Tuesday she was not able to keep anything down including soup or ginger ale. Patient states she has not had any other symptoms.    Subjective   Virginia Cabrera is a 66 y.o. established patient. Telephone visit today for nvd  HPI Onset Sunday. Continued into M and Tu. Cannot keep anything down - even clear fluids. No other symptoms, no fevers, chills, fatigue, sweats, palpitations, headaches, visual changes No blood or mucus in stool or vomit.  Has happened in the past, has taken a dexamethasone with good effect. Does not prefer to take ondansetron or compazine.   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 20 tablet 0   traZODone (DESYREL) 100 MG tablet TK 1 T PO HS PRN     albuterol (VENTOLIN HFA) 108 (90 Base) MCG/ACT inhaler INHALE 1 PUFF BY MOUTH EVERY 4 TO 6 HOURS AS NEEDED 18 g 2   ALPRAZolam (NIRAVAM) 0.5 MG dissolvable tablet Take 1-2 tablets (0.5-1 mg total) by mouth daily as needed for anxiety (pre-procedure). 10 tablet 0   amLODipine (NORVASC) 5 MG tablet Take 1 tablet (5 mg total) by mouth daily. 90 tablet 3   budesonide-formoterol (SYMBICORT) 160-4.5 MCG/ACT inhaler Inhale 2 puffs into the lungs 2 (two) times daily. 1 each 3   escitalopram (LEXAPRO) 20 MG tablet Take 1 tablet (20 mg total) by mouth at bedtime. 90 tablet 3   esomeprazole (NEXIUM) 40 MG capsule Take 1 capsule (40 mg total) by mouth daily at 12 noon. 90 capsule 3   fluticasone (FLOVENT HFA) 220 MCG/ACT inhaler Inhale 2 puffs into the lungs in the morning and at bedtime. 3 each 12   losartan (  COZAAR) 100 MG tablet Take 1 tablet (100 mg total) by mouth daily. 90 tablet 3   meclizine (ANTIVERT) 25 MG tablet Take 1 tablet (25 mg total) by mouth 3 (three) times daily as needed for dizziness. 30 tablet 0   methocarbamol (ROBAXIN) 500 MG tablet Take 1 tablet (500 mg total) by mouth 4 (four) times daily. 60 tablet 0   polyethylene glycol powder (GLYCOLAX/MIRALAX) 17 GM/SCOOP powder Take 17 g by mouth 2 (two) times daily as needed. 6700 g 2   Psyllium 0.36 g CAPS Take 1 capsule by mouth daily. 90 capsule 3   spironolactone (ALDACTONE) 25 MG tablet  Take 1 tablet (25 mg total) by mouth daily. 90 tablet 3   sucralfate (CARAFATE) 1 g tablet Take 1 tablet (1 g total) by mouth 4 (four) times daily -  with meals and at bedtime. 56 tablet 1   terbinafine (LAMISIL) 250 MG tablet Take 1 tablet (250 mg total) by mouth daily. 84 tablet 0   traMADol (ULTRAM) 50 MG tablet Take 1 tablet (50 mg total) by mouth every 12 (twelve) hours as needed. 180 tablet 0   Vitamin D, Ergocalciferol, (DRISDOL) 1.25 MG (50000 UNIT) CAPS capsule Take 1 capsule (50,000 Units total) by mouth every 7 (seven) days. 8 capsule 0   No current facility-administered medications for this visit.    Allergies  Allergen Reactions   Morphine Other (See Comments)    Causes "coma"   Morphine And Related     Social History   Socioeconomic History   Marital status: Single    Spouse name: Not on file   Number of children: 1   Years of education: Not on file   Highest education level: 12th grade  Occupational History   Occupation: Groomer  Tobacco Use   Smoking status: Former    Packs/day: 0.20    Years: 15.00    Pack years: 3.00    Types: Cigarettes    Start date: 08/24/1983    Quit date: 08/24/1998    Years since quitting: 22.6   Smokeless tobacco: Never  Vaping Use   Vaping Use: Never used  Substance and Sexual Activity   Alcohol use: Yes    Comment: occssionally   Drug use: No   Sexual activity: Not on file  Other Topics Concern   Not on file  Social History Narrative   Not on file   Social Determinants of Health   Financial Resource Strain: Not on file  Food Insecurity: Not on file  Transportation Needs: Not on file  Physical Activity: Not on file  Stress: Not on file  Social Connections: Not on file  Intimate Partner Violence: Not on file    Review of Systems  Constitutional: Negative.   HENT: Negative.    Eyes: Negative.   Respiratory: Negative.    Cardiovascular: Negative.   Gastrointestinal:  Positive for diarrhea, nausea and vomiting.   Genitourinary: Negative.   Musculoskeletal: Negative.   Skin: Negative.   Neurological: Negative.   Endo/Heme/Allergies: Negative.   Psychiatric/Behavioral: Negative.    All other systems reviewed and are negative.  Objective   Vitals as reported by the patient: There were no vitals filed for this visit.  Virginia Cabrera was seen today for diarrhea.  Diagnoses and all orders for this visit:  Bilious vomiting with nausea -     dexamethasone (DECADRON) 1 MG tablet; Take 1 tablet (1 mg total) by mouth 2 (two) times daily with a meal.   PLAN Course of  dexamethasone as above Reviewed risks, benefits, AE to this with patient, who voices understanding  ER precautions given including symptoms that worsen or fail to improve and symptoms of dehydration or electrolyte imbalance Patient encouraged to call clinic with any questions, comments, or concerns.   I discussed the assessment and treatment plan with the patient. The patient was provided an opportunity to ask questions and all were answered. The patient agreed with the plan and demonstrated an understanding of the instructions.   The patient was advised to call back or seek an in-person evaluation if the symptoms worsen or if the condition fails to improve as anticipated.  I provided 13 minutes of non-face-to-face time during this encounter.  Maximiano Coss, NP  Primary Care at [facility]

## 2021-05-01 DIAGNOSIS — M7541 Impingement syndrome of right shoulder: Secondary | ICD-10-CM | POA: Diagnosis not present

## 2021-05-08 DIAGNOSIS — Z1231 Encounter for screening mammogram for malignant neoplasm of breast: Secondary | ICD-10-CM | POA: Diagnosis not present

## 2021-05-08 LAB — HM MAMMOGRAPHY

## 2021-05-20 IMAGING — MG DIGITAL SCREENING BILATERAL MAMMOGRAM WITH TOMO AND CAD
6 of 12 series · 6 of 36 positions shown · non-contrast
Comparison: Previous exam(s).

CLINICAL DATA: Screening.

EXAM:
DIGITAL SCREENING BILATERAL MAMMOGRAM WITH TOMO AND CAD

[L MLO synth-2D (1 of 2)]
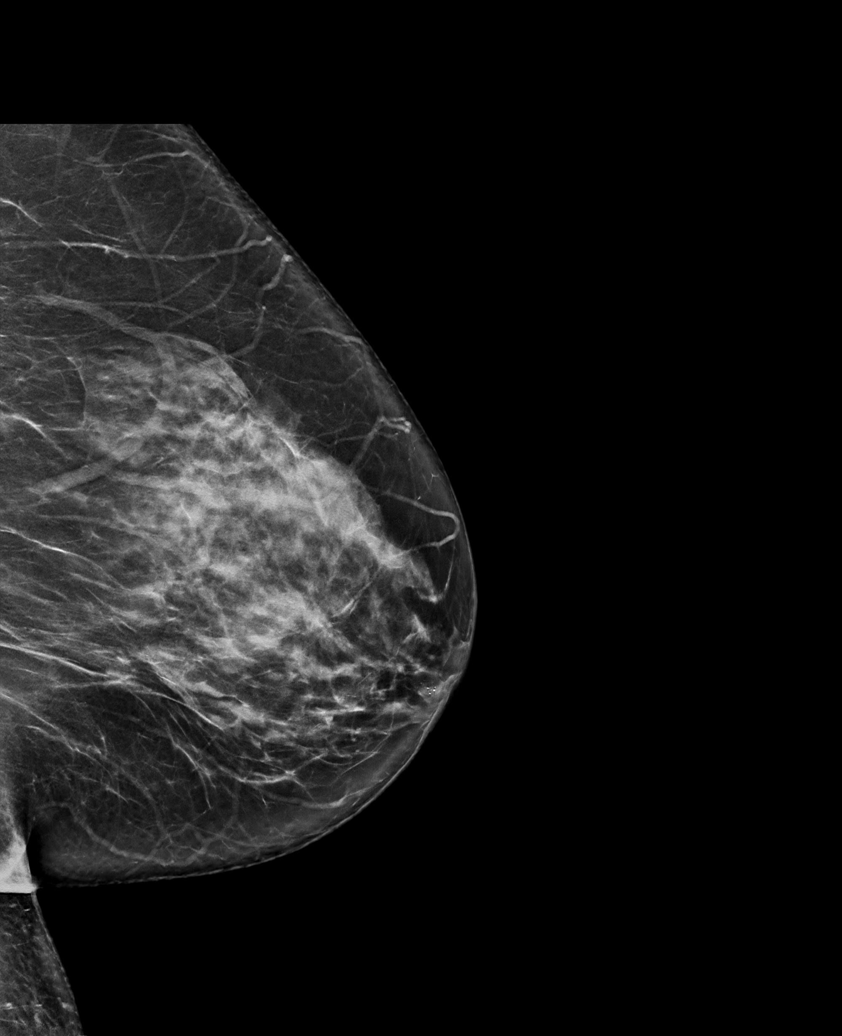

[R MLO synth-2D (1 of 2)]
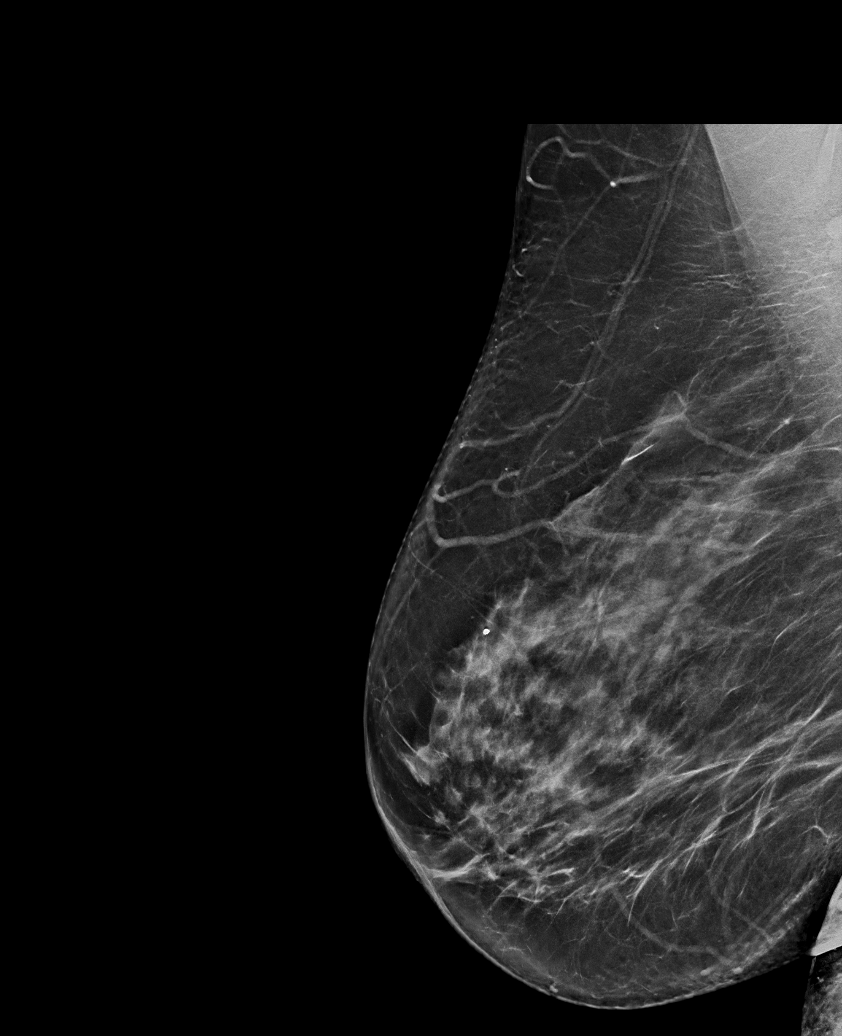

[R CC synth-2D]
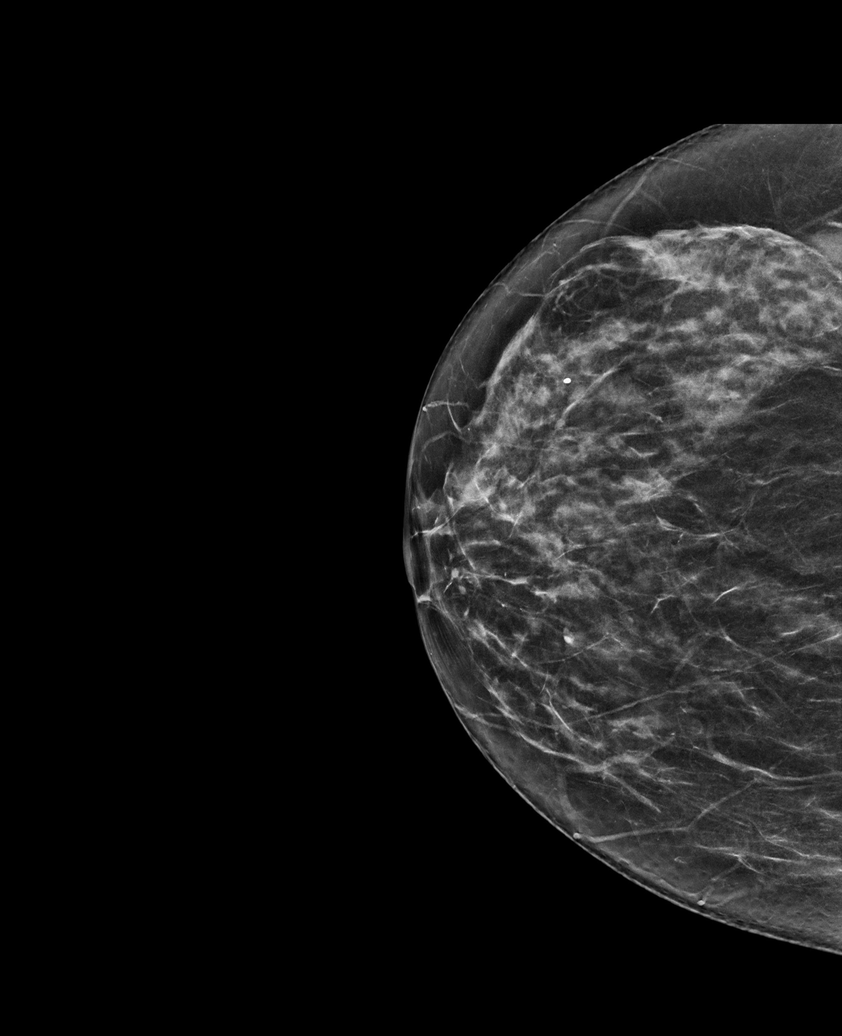

[R MLO synth-2D (2 of 2)]
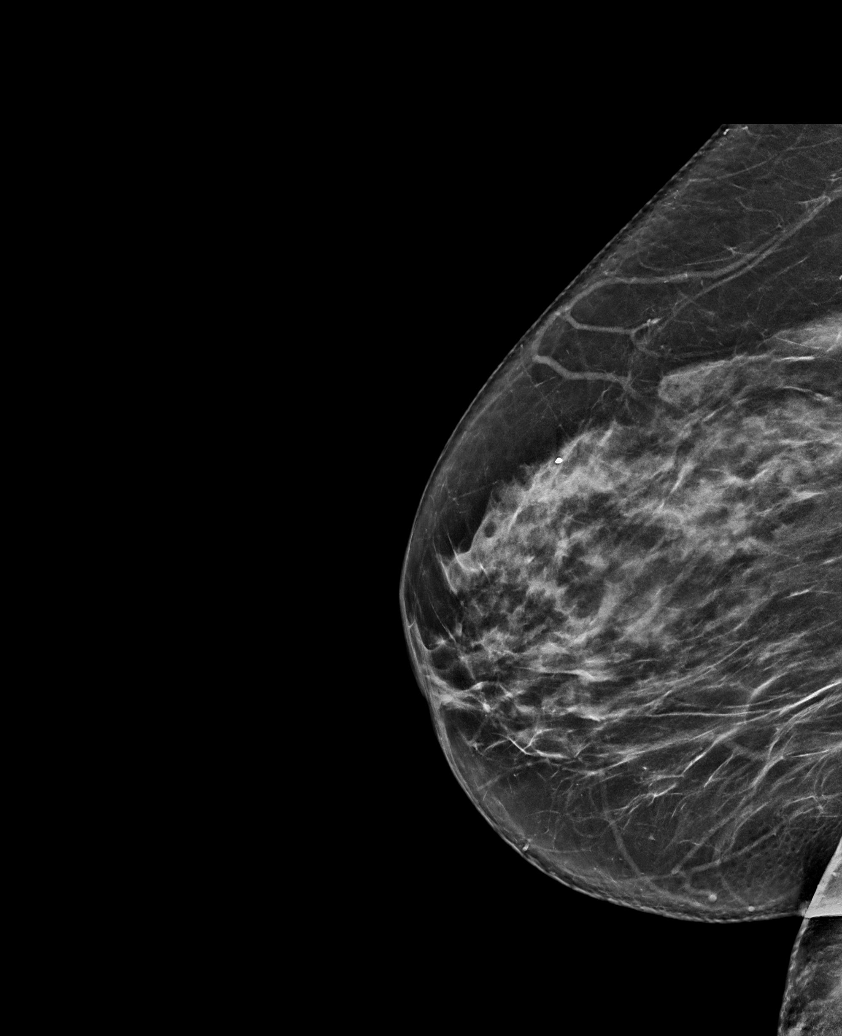

[L CC synth-2D]
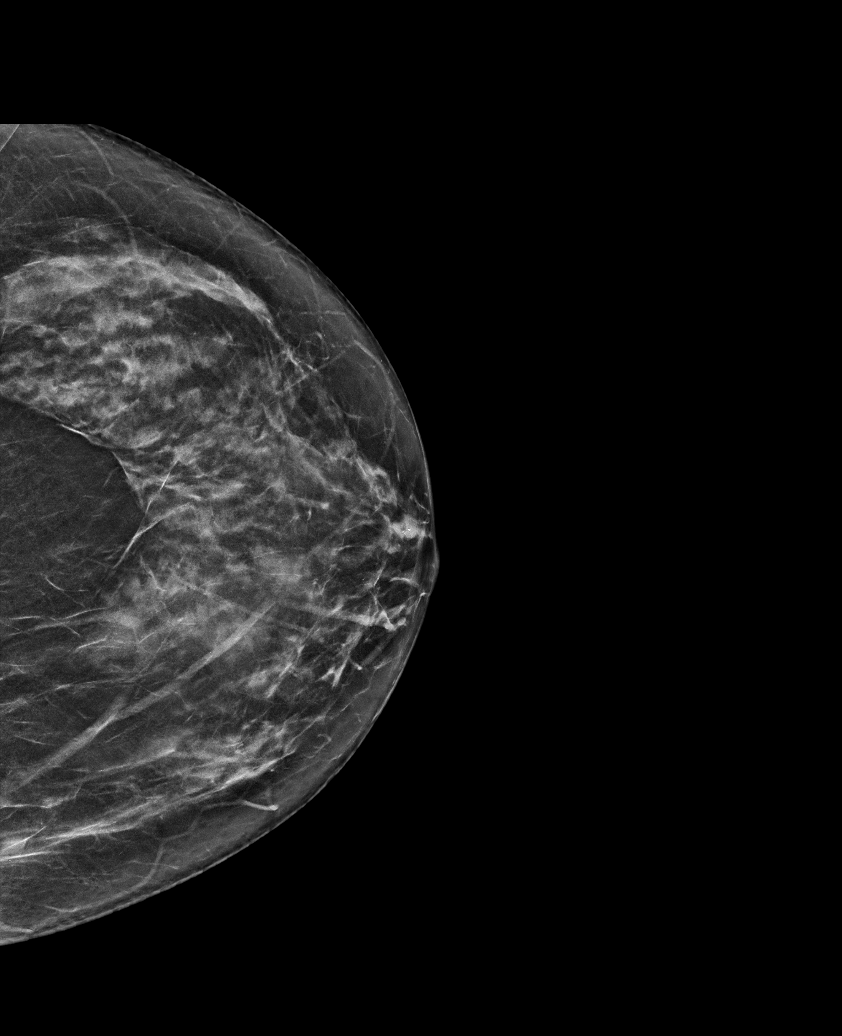

[L MLO synth-2D (2 of 2)]
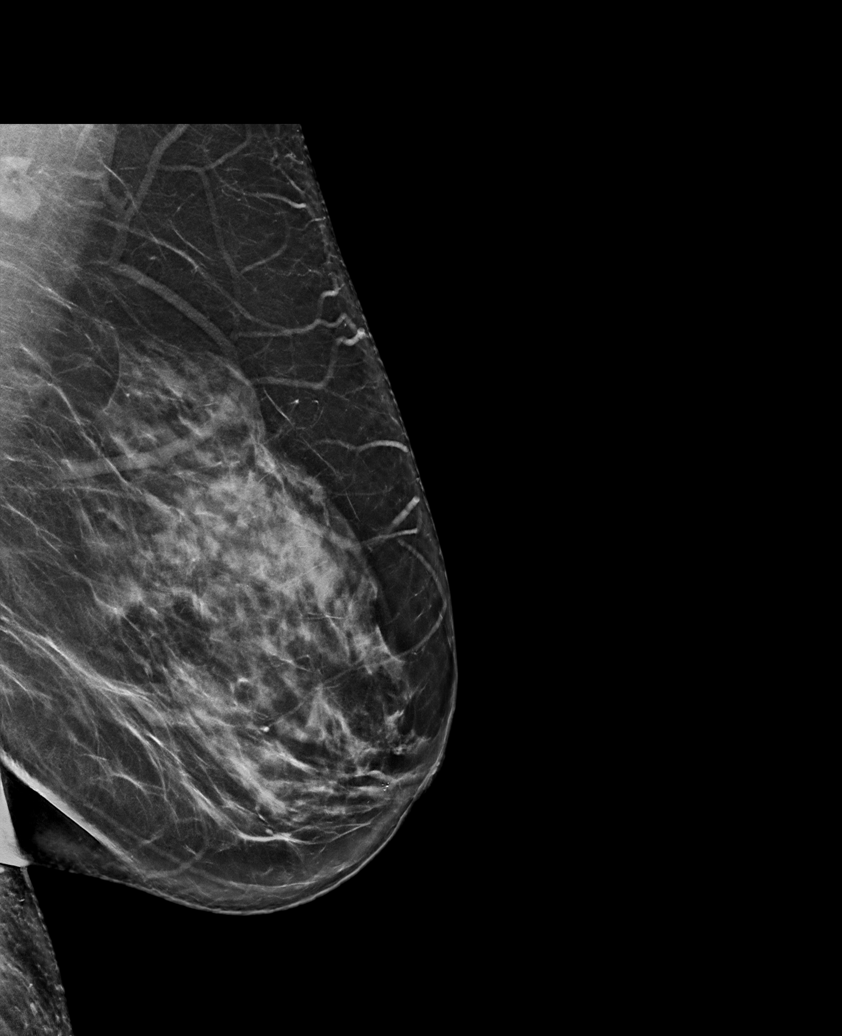

[6 of 36 positions shown; findings below may reference images not displayed]

ACR Breast Density Category c: The breast tissue is heterogeneously
dense, which may obscure small masses.
FINDINGS: There are no findings suspicious for malignancy. Images were
processed with CAD.
IMPRESSION: No mammographic evidence of malignancy. A result letter of this
screening mammogram will be mailed directly to the patient.

RECOMMENDATION:
Screening mammogram in one year. (Code:FT-U-LHB)

BI-RADS CATEGORY  1: Negative.

## 2021-05-28 ENCOUNTER — Encounter: Payer: Self-pay | Admitting: Internal Medicine

## 2021-06-04 ENCOUNTER — Telehealth: Payer: Self-pay

## 2021-06-04 NOTE — Telephone Encounter (Signed)
Patient called in stating that her insurance is not wanting to pay for her inhaler. [place] states she was told they can try to prescribe another inhaler or Gerlene Burdock can write a letter that states this is the only inhaler that works for her. ?

## 2021-06-05 ENCOUNTER — Other Ambulatory Visit: Payer: Self-pay | Admitting: Registered Nurse

## 2021-06-05 DIAGNOSIS — R0602 Shortness of breath: Secondary | ICD-10-CM

## 2021-06-05 MED ORDER — FLUTICASONE-SALMETEROL 250-50 MCG/ACT IN AEPB: 1.0000 | INHALATION_SPRAY | Freq: Two times a day (BID) | RESPIRATORY_TRACT | 11 refills | Status: DC

## 2021-06-05 NOTE — Telephone Encounter (Signed)
Called patient and she stated that she will try the new medication sent in ?

## 2021-06-05 NOTE — Telephone Encounter (Signed)
Have sent advair diskus which appears to be covered rather than the flovent or symbicort she had been on before.  ? ?Thanks, ? ?Rich

## 2021-06-12 ENCOUNTER — Ambulatory Visit: Payer: Medicare Other | Admitting: Cardiovascular Disease

## 2021-06-12 ENCOUNTER — Encounter: Payer: Self-pay | Admitting: Cardiovascular Disease

## 2021-06-12 VITALS — BP 136/86 | HR 74 | Ht 63.0 in | Wt 219.2 lb

## 2021-06-12 DIAGNOSIS — R079 Chest pain, unspecified: Secondary | ICD-10-CM

## 2021-06-12 DIAGNOSIS — I10 Essential (primary) hypertension: Secondary | ICD-10-CM | POA: Diagnosis not present

## 2021-06-12 MED ORDER — SPIRONOLACTONE 50 MG PO TABS: 50.0000 mg | ORAL_TABLET | Freq: Every day | ORAL | 3 refills | Status: DC

## 2021-06-12 NOTE — Progress Notes (Signed)
?Cardiology Office Note:   ? ?Date:  06/12/2021  ? ?ID:  Virginia Cabrera, DOB [birth year], MRN [id number] ? ?PCP:  Maximiano Coss, NP  ?Cardiologist: Verma Grothaus  ?Electrophysiologist:  None  ? ?Referring MD: Maximiano Coss, NP  ? ?Chief Complaint  ?Patient presents with  ? Shortness of Breath  ?   ?  ? Hypertension  ?   ?  ? ? ?History of Present Illness:   ? ?Virginia Cabrera is a 66 y.o. female with a hx of HTN ? ?Were are seeing her at the request of Maximiano Coss for further evaluation of a long QT interval  ?Hx of HTN and obesity  ?Also has knee and back issues .  ? ?In Oct. She had an episode of severe leg cramps at [facility]   ?She had pre-syncope . Cold, clammy , felt pale,  ? ?Went to ER , found to have very low potassium level .  ?Was having lots of PVCs  ? ?She saw her primary medical doctor on October 21. At that time her potassium was still 3.3. QT interval was 418 ms with a QTC of 457 ms. She remains a little fatigued but has not had any further episodes of this leg cramping or cold and clammy sensation or presyncope. ? ?She has a knee injury that is going to require surgery at some point. She is not able to get much exercise. ? ?No cp , no nausea ,  No hematuria , hematachezia, , no rash ,  ?Has DOE with any exercise - admits that her weight is likely an issue.  ?Watches her salt intake ?Eats lots of potatoes and chocolate  ? Marland Kitchen  ?June 12, 2021: ?[name] is seen after 3 year absence ?BP is mildly elevaste  ?We discussed weight loss ?Has issues with her R knee,  needs R TKA  ?Also needs shoulder surgery  ? ?Will increase her spiro to 50 mg a day  ?Has some left sided chest discomfort,  lasts many seconds  ?Will get a lexiscan myoview  ? ?Past Medical History:  ?Diagnosis Date  ? Allergy   ? Anxiety   ? Arthritis   ? Asthma   ? Depression   ? Hypertension   ? ? ?Past Surgical History:  ?Procedure Laterality Date  ? CESAREAN SECTION    ? TONSILLECTOMY    ? ? ?Current Medications: ?Current Meds   ?Medication Sig  ? albuterol (VENTOLIN HFA) 108 (90 Base) MCG/ACT inhaler INHALE 1 PUFF BY MOUTH EVERY 4 TO 6 HOURS AS NEEDED  ? ALPRAZolam (NIRAVAM) 0.5 MG dissolvable tablet Take 1-2 tablets (0.5-1 mg total) by mouth daily as needed for anxiety (pre-procedure).  ? amLODipine (NORVASC) 5 MG tablet Take 1 tablet (5 mg total) by mouth daily.  ? celecoxib (CELEBREX) 200 MG capsule TAKE 1 CAPSULE BY MOUTH ONCE DAILY FOR 30 DAYS  ? dexamethasone (DECADRON) 1 MG tablet Take 1 tablet (1 mg total) by mouth 2 (two) times daily with a meal.  ? escitalopram (LEXAPRO) 20 MG tablet Take 1 tablet (20 mg total) by mouth at bedtime.  ? esomeprazole (NEXIUM) 40 MG capsule Take 1 capsule (40 mg total) by mouth daily at 12 noon.  ? fluticasone-salmeterol (ADVAIR DISKUS) 250-50 MCG/ACT AEPB Inhale 1 puff into the lungs in the morning and at bedtime.  ? losartan (COZAAR) 100 MG tablet Take 1 tablet (100 mg total) by mouth daily.  ? meclizine (ANTIVERT) 25 MG tablet Take 1 tablet (25 mg total)  by mouth 3 (three) times daily as needed for dizziness.  ? methocarbamol (ROBAXIN) 500 MG tablet Take 1 tablet (500 mg total) by mouth 4 (four) times daily.  ? polyethylene glycol powder (GLYCOLAX/MIRALAX) 17 GM/SCOOP powder Take 17 g by mouth 2 (two) times daily as needed.  ? Psyllium 0.36 g CAPS Take 1 capsule by mouth daily.  ? spironolactone (ALDACTONE) 50 MG tablet Take 1 tablet (50 mg total) by mouth daily.  ? sucralfate (CARAFATE) 1 g tablet Take 1 tablet (1 g total) by mouth 4 (four) times daily -  with meals and at bedtime.  ? terbinafine (LAMISIL) 250 MG tablet Take 1 tablet (250 mg total) by mouth daily.  ? traMADol (ULTRAM) 50 MG tablet Take 1 tablet (50 mg total) by mouth every 12 (twelve) hours as needed.  ? traZODone (DESYREL) 100 MG tablet TK 1 T PO HS PRN  ? Vitamin D, Ergocalciferol, (DRISDOL) 1.25 MG (50000 UNIT) CAPS capsule Take 1 capsule (50,000 Units total) by mouth every 7 (seven) days.  ? [DISCONTINUED] spironolactone  (ALDACTONE) 25 MG tablet Take 1 tablet (25 mg total) by mouth daily.  ?  ? ?Allergies:   Morphine and Morphine and related  ? ?Social History  ? ?Socioeconomic History  ? Marital status: Single  ?  Spouse name: Not on file  ? Number of children: 1  ? Years of education: Not on file  ? Highest education level: 12th grade  ?Occupational History  ? Occupation: Groomer  ?Tobacco Use  ? Smoking status: Former  ?  Packs/day: 0.20  ?  Years: 15.00  ?  Pack years: 3.00  ?  Types: Cigarettes  ?  Start date: 08/24/1983  ?  Quit date: 08/24/1998  ?  Years since quitting: 22.8  ? Smokeless tobacco: Never  ?Vaping Use  ? Vaping Use: Never used  ?Substance and Sexual Activity  ? Alcohol use: Yes  ?  Comment: occssionally  ? Drug use: No  ? Sexual activity: Not on file  ?Other Topics Concern  ? Not on file  ?Social History Narrative  ? Not on file  ? ?Social Determinants of Health  ? ?Financial Resource Strain: Not on file  ?Food Insecurity: Not on file  ?Transportation Needs: Not on file  ?Physical Activity: Not on file  ?Stress: Not on file  ?Social Connections: Not on file  ?  ? ?Family History: ?The patient's family history includes Brain cancer in her father; Breast cancer in her maternal aunt and mother; Diabetes in her brother; Hypertension in her brother and sister; Kidney disease in her brother; Stroke in her maternal aunt. ? ?ROS:   ?Please see the history of present illness.    ? All other systems reviewed and are negative. ? ?EKGs/Labs/Other Studies Reviewed:   ? ?The following studies were reviewed today: ? ? ?EKG:   June 12, 2021: Normal sinus rhythm at 74.  No ST or T wave changes.  QT and QTc intervals are normal. ? ?Recent Labs: ?06/19/2020: ALT 31; BUN 16; Creatinine, Ser 1.12; Hemoglobin 13.8; Platelets 215.0; Potassium 4.1; Sodium 138  ?Recent Lipid Panel ?   ?Component Value Date/Time  ? CHOL 194 06/19/2020 1136  ? CHOL 204 (H) 07/05/2019 1229  ? TRIG 179.0 (H) 06/19/2020 1136  ? HDL 50.30 06/19/2020 1136  ? HDL  49 07/05/2019 1229  ? CHOLHDL 4 06/19/2020 1136  ? VLDL 35.8 06/19/2020 1136  ? LDLCALC 108 (H) 06/19/2020 1136  ? LDLCALC 113 (H) 07/05/2019 1229  ? ? ?  Physical Exam:   ? ?Physical Exam: ?Blood pressure 136/86, pulse 74, height 5\' 3"  (1.6 m), weight 219 lb 3.2 oz (99.4 kg), SpO2 97 %. ? ?GEN:  Well nourished, well developed in no acute distress ?HEENT: Normal ?NECK: No JVD; No carotid bruits ?LYMPHATICS: No lymphadenopathy ?CARDIAC: RRR , no murmurs, rubs, gallops ?RESPIRATORY:  Clear to auscultation without rales, wheezing or rhonchi  ?ABDOMEN: Soft, non-tender, non-distended ?MUSCULOSKELETAL:  No edema; No deformity  ?SKIN: Warm and dry ?NEUROLOGIC:  Alert and oriented x 3 ? ? ?ASSESSMENT:   ? ?1. Essential hypertension   ?2. Chest pain of uncertain etiology   ? ? ?PLAN:   ? ? ?Prolonged QT : Was due to her hypokalemia.  Is not been an issue. ? ? ?2.  Dyspnea on exertion: She is also having some chest discomfort.  We will schedule her for a Lexiscan Myoview study. ? ?3.  Hypertension: Blood pressure remains mildly elevated.  She also has some diastolic dysfunction.  We will increase her spironolactone to 50 mg a day.  Check a basic metabolic profile in 3 weeks. ? ?Medication Adjustments/Labs and Tests Ordered: ?Current medicines are reviewed at length with the patient today.  Concerns regarding medicines are outlined above.  ?Orders Placed This Encounter  ?Procedures  ? Basic metabolic panel  ? MYOCARDIAL PERFUSION IMAGING  ? EKG 12-Lead  ? ?Meds ordered this encounter  ?Medications  ? spironolactone (ALDACTONE) 50 MG tablet  ?  Sig: Take 1 tablet (50 mg total) by mouth daily.  ?  Dispense:  90 tablet  ?  Refill:  3  ?  Dose Change  ? ? ? ?Patient Instructions  ?Medication Instructions:  ?Your physician has recommended you make the following change in your medication:  ? ?1) INCREASE Spironolactone 50mg  once daily ? ?*If you need a refill on your cardiac medications before your next appointment, please call your  pharmacy* ? ?Lab Work: ?In 3 weeks: BMP ?If you have labs (blood work) drawn today and your tests are completely normal, you will receive your results only by: ?MyChart Message (if you have MyChart) OR ?A paper

## 2021-06-12 NOTE — Patient Instructions (Signed)
Medication Instructions:  ?Your physician has recommended you make the following change in your medication:  ? ?1) INCREASE Spironolactone 50mg  once daily ? ?*If you need a refill on your cardiac medications before your next appointment, please call your pharmacy* ? ?Lab Work: ?In 3 weeks: BMP ?If you have labs (blood work) drawn today and your tests are completely normal, you will receive your results only by: ?MyChart Message (if you have MyChart) OR ?A paper copy in the mail ?If you have any lab test that is abnormal or we need to change your treatment, we will call you to review the results. ? ?Testing/Procedures: ?Your physician has requested that you have a lexiscan myoview. For further information please visit . Please follow instruction sheet, as given. ? ?Follow-Up: ?At [facility], you and your health needs are our priority.  As part of our continuing mission to provide you with exceptional heart care, we have created designated Provider Care Teams.  These Care Teams include your primary Cardiologist (physician) and Advanced Practice Providers (APPs -  Physician Assistants and Nurse Practitioners) who all work together to provide you with the care you need, when you need it. ? ?We recommend signing up for the patient portal called "MyChart".  Sign up information is provided on this After Visit Summary.  MyChart is used to connect with patients for Virtual Visits (Telemedicine).  Patients are able to view lab/test results, encounter notes, upcoming appointments, etc.  Non-urgent messages can be sent to your provider as well.   ?To learn more about what you can do with MyChart, go to [facility].   ? ?Your next appointment:   ?1 year(s) ? ?The format for your next appointment:   ?In Person ? ?Provider:   ?[email or web address], MD  or Kristeen Miss, PA-C or Chelsea Aus, Tereso Newcomer ?

## 2021-06-13 ENCOUNTER — Ambulatory Visit: Payer: Medicare Other | Admitting: Internal Medicine

## 2021-06-13 ENCOUNTER — Encounter: Payer: Self-pay | Admitting: Internal Medicine

## 2021-06-13 VITALS — BP 124/72 | HR 72 | Ht 63.0 in | Wt 217.2 lb

## 2021-06-13 DIAGNOSIS — Z86010 Personal history of colonic polyps: Secondary | ICD-10-CM

## 2021-06-13 MED ORDER — NA SULFATE-K SULFATE-MG SULF 17.5-3.13-1.6 GM/177ML PO SOLN: 1.0000 | Freq: Once | ORAL | 0 refills | Status: AC

## 2021-06-13 NOTE — Patient Instructions (Signed)
If you are age 64 or older, your body mass index should be between 23-30. Your Body mass index is 38.48 kg/m?Marland Kitchen If this is out of the aforementioned range listed, please consider follow up with your Primary Care Provider. ? ?If you are age 31 or younger, your body mass index should be between 19-25. Your Body mass index is 38.48 kg/m?Marland Kitchen If this is out of the aformentioned range listed, please consider follow up with your Primary Care Provider.  ? ?You have been scheduled for a colonoscopy. Please follow written instructions given to you at your visit today.  ?Please pick up your prep supplies at the pharmacy within the next 1-3 days. ?If you use inhalers (even only as needed), please bring them with you on the day of your procedure. ? ? ?The [facility] providers would like to encourage you to use [facility] to communicate with providers for non-urgent requests or questions.  Due to long hold times on the telephone, sending your provider a message by [facility] may be a faster and more efficient way to get a response.  Please allow 48 business hours for a response.  Please remember that this is for non-urgent requests.  ? ?It was a pleasure to see you today! ? ?Thank you for trusting me with your gastrointestinal care!   ? ?Eulah Pont, MD  ?

## 2021-06-13 NOTE — Progress Notes (Signed)
? ?Chief Complaint: Colon cancer screening ? ?HPI : 66 year old female with history of HFpEF, asthma, depression anxiety presents for colon cancer screening ? ?Her last colonoscopy was done in 2009 with Dr Benson Norway. That colonoscopy was reportedly normal and she was told that she could come back in 10 years. She has issues with constipation. When she eats a lot of cheese and peanut butter, she will have more issues with constipation Denies fam hx of colon cancer. Denies blood in stools. On average she has 1 BM per day. She takes fiber gummies, which have helped with her constipation. Denies taking any laxatives currently. She has been on Miralax in the past but has not needed to be back on Miralax after she started the fiber gummies. Has abdominal cramping that improves after having a BM. Has had intentional weight loss. She does have chronic GERD for which she takes Nexium 40 mg QD. Fam hx of colon polyps in sister.  ? ?Past Medical History:  ?Diagnosis Date  ? Allergy   ? Anxiety   ? Arthritis   ? Asthma   ? Depression   ? Hypertension   ? ? ? ?Past Surgical History:  ?Procedure Laterality Date  ? CESAREAN SECTION    ? TONSILLECTOMY    ? ?Family History  ?Problem Relation Age of Onset  ? Breast cancer Mother   ? Brain cancer Father   ? Breast cancer Maternal Aunt   ? Stroke Maternal Aunt   ? Hypertension Sister   ? Diabetes Brother   ? Kidney disease Brother   ? Hypertension Brother   ? ?Social History  ? ?Tobacco Use  ? Smoking status: Former  ?  Packs/day: 0.20  ?  Years: 15.00  ?  Pack years: 3.00  ?  Types: Cigarettes  ?  Start date: 08/24/1983  ?  Quit date: 08/24/1998  ?  Years since quitting: 22.8  ? Smokeless tobacco: Never  ?Vaping Use  ? Vaping Use: Never used  ?Substance Use Topics  ? Alcohol use: Yes  ?  Comment: occssionally  ? Drug use: No  ? ?Current Outpatient Medications  ?Medication Sig Dispense Refill  ? albuterol (VENTOLIN HFA) 108 (90 Base) MCG/ACT inhaler INHALE 1 PUFF BY MOUTH EVERY 4 TO 6 HOURS  AS NEEDED 18 g 2  ? ALPRAZolam (NIRAVAM) 0.5 MG dissolvable tablet Take 1-2 tablets (0.5-1 mg total) by mouth daily as needed for anxiety (pre-procedure). 10 tablet 0  ? amLODipine (NORVASC) 5 MG tablet Take 1 tablet (5 mg total) by mouth daily. 90 tablet 3  ? celecoxib (CELEBREX) 200 MG capsule TAKE 1 CAPSULE BY MOUTH ONCE DAILY FOR 30 DAYS    ? dexamethasone (DECADRON) 1 MG tablet Take 1 tablet (1 mg total) by mouth 2 (two) times daily with a meal. 20 tablet 0  ? escitalopram (LEXAPRO) 20 MG tablet Take 1 tablet (20 mg total) by mouth at bedtime. 90 tablet 3  ? esomeprazole (NEXIUM) 40 MG capsule Take 1 capsule (40 mg total) by mouth daily at 12 noon. 90 capsule 3  ? fluticasone-salmeterol (ADVAIR DISKUS) 250-50 MCG/ACT AEPB Inhale 1 puff into the lungs in the morning and at bedtime. 60 each 11  ? losartan (COZAAR) 100 MG tablet Take 1 tablet (100 mg total) by mouth daily. 90 tablet 3  ? meclizine (ANTIVERT) 25 MG tablet Take 1 tablet (25 mg total) by mouth 3 (three) times daily as needed for dizziness. 30 tablet 0  ? methocarbamol (ROBAXIN) 500  MG tablet Take 1 tablet (500 mg total) by mouth 4 (four) times daily. 60 tablet 0  ? Psyllium 0.36 g CAPS Take 1 capsule by mouth daily. 90 capsule 3  ? spironolactone (ALDACTONE) 50 MG tablet Take 1 tablet (50 mg total) by mouth daily. 90 tablet 3  ? sucralfate (CARAFATE) 1 g tablet Take 1 tablet (1 g total) by mouth 4 (four) times daily -  with meals and at bedtime. 56 tablet 1  ? terbinafine (LAMISIL) 250 MG tablet Take 1 tablet (250 mg total) by mouth daily. 84 tablet 0  ? traMADol (ULTRAM) 50 MG tablet Take 1 tablet (50 mg total) by mouth every 12 (twelve) hours as needed. 180 tablet 0  ? traZODone (DESYREL) 100 MG tablet TK 1 T PO HS PRN    ? Vitamin D, Ergocalciferol, (DRISDOL) 1.25 MG (50000 UNIT) CAPS capsule Take 1 capsule (50,000 Units total) by mouth every 7 (seven) days. 8 capsule 0  ? ?No current facility-administered medications for this visit.  ? ?Allergies   ?Allergen Reactions  ? Morphine Other (See Comments)  ?  Causes "coma"  ? Morphine And Related   ? ? ?Review of Systems: ?All systems reviewed and negative except where noted in HPI.  ? ?Physical Exam: ?Ht 5\' 3"  (1.6 m)   Wt 217 lb 4 oz (98.5 kg)   BMI 38.48 kg/m?  ?Constitutional: Pleasant,well-developed, female in no acute distress. ?HEENT: Normocephalic and atraumatic. Conjunctivae are normal. No scleral icterus. ?Cardiovascular: Normal rate, regular rhythm.  ?Pulmonary/chest: Effort normal and breath sounds normal. No wheezing, rales or rhonchi. ?Abdominal: Soft, nondistended, nontender. Bowel sounds active throughout. There are no masses palpable. No hepatomegaly. ?Extremities: No edema ?Neurological: Alert and oriented to person place and time. ?Skin: Skin is warm and dry. No rashes noted. ?Psychiatric: Normal mood and affect. Behavior is normal. ? ?Colonoscopy 11/2002: ? IMPRESSION: ? 1. 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  Biopsy results pending. ? 2. Sessile polyps snared from mid right colon. ? 3. Large amount of residual stool in the colon; small lesions could have been missed. ? 4. Normal terminal ileum. ?Path: ? 1. COLON: ADENOMATOUS POLYP(S). NO HIGH GRADE DYSPLASIA OR  ? INVASIVE MALIGNANCY IDENTIFIED (BIOPSIES, RIGHT MID)  ? 2. RECTUM: SEVERE ACTIVE MUCOSAL PROCTITIS (BIOPSY, RECTUM)  ? COMMENT  ? 1. There is adenoma with nuclear stratification and decreased  ? mucin. High grade dysplasia or invasive malignancy are not  ? identified. The findings are consistent with an adenomatous  ? polyp(s) if the biopsy(ies) is (are) representative of the entire  ? lesion(s).  ? 2. There are fragments of colorectal-type mucosa in which there is severe inflammation associated with cryptitis, architectural  distortion, basal plasmacytosis, and surface mucosal erosion. In one level of sectioning there are a few giant cells surrounding a disrupted gland but no other granulomas is identified. The findings are  those of a severe active mucosal proctitis that displays features most consistent with inflammatory bowel disease, favor ulcerative colitis in the appropriate clinical  setting. (JAS:kcv [id number])  ? ?ASSESSMENT AND PLAN: ? ?History of colon polyps ?Patient presents for treatment options for colon cancer screening.  At this time because the patient had history of sessile polyps removed from the colon back in 2004, I believe that the best option colon cancer screening in this patient would be a colonoscopy.  Cologuard would be a subpar option in a patient with history of colon polyps in the past.  Patient is agreeable to  proceeding with colonoscopy ?- Colonoscopy [facility] ?- Will attempt to get her most recent colonoscopy records from Dr. Benson Norway in 2009 ? ?Christia Reading, MD ? ?

## 2021-06-18 ENCOUNTER — Telehealth (HOSPITAL_COMMUNITY): Payer: Self-pay | Admitting: *Deleted

## 2021-06-18 ENCOUNTER — Telehealth: Payer: Self-pay | Admitting: Internal Medicine

## 2021-06-18 NOTE — Telephone Encounter (Signed)
Patient called to cancel colonoscopy procedure  06/20/21. Per patient, she was exposed to [place]. Patient states she will call back to reschedule procedure.  ?

## 2021-06-18 NOTE — Telephone Encounter (Signed)
Patient given detailed instructions per Myocardial Perfusion Study Information Sheet for the test on 06/25/21 at 0745. Patient notified to arrive 15 minutes early and that it is imperative to arrive on time for appointment to keep from having the test rescheduled. ? If you need to cancel or reschedule your appointment, please call the office within 24 hours of your appointment. . Patient verbalized understanding.Virginia Cabrera, [name] ? ? ?

## 2021-06-19 ENCOUNTER — Ambulatory Visit (INDEPENDENT_AMBULATORY_CARE_PROVIDER_SITE_OTHER): Payer: Medicare Other | Admitting: Registered Nurse

## 2021-06-19 ENCOUNTER — Encounter: Payer: Self-pay | Admitting: Registered Nurse

## 2021-06-19 ENCOUNTER — Other Ambulatory Visit: Payer: Self-pay

## 2021-06-19 VITALS — BP 127/80 | HR 76 | Temp 98.1°F | Resp 18 | Ht 62.75 in | Wt 216.8 lb

## 2021-06-19 DIAGNOSIS — R42 Dizziness and giddiness: Secondary | ICD-10-CM | POA: Diagnosis not present

## 2021-06-19 DIAGNOSIS — M1712 Unilateral primary osteoarthritis, left knee: Secondary | ICD-10-CM | POA: Diagnosis not present

## 2021-06-19 DIAGNOSIS — R252 Cramp and spasm: Secondary | ICD-10-CM | POA: Diagnosis not present

## 2021-06-19 DIAGNOSIS — R0602 Shortness of breath: Secondary | ICD-10-CM

## 2021-06-19 LAB — COMPREHENSIVE METABOLIC PANEL
ALT: 25 U/L (ref 0–35)
AST: 25 U/L (ref 0–37)
Albumin: 4.8 g/dL (ref 3.5–5.2)
Alkaline Phosphatase: 61 U/L (ref 39–117)
BUN: 17 mg/dL (ref 6–23)
CO2: 26 mEq/L (ref 19–32)
Calcium: 9.6 mg/dL (ref 8.4–10.5)
Chloride: 102 mEq/L (ref 96–112)
Creatinine, Ser: 1.18 mg/dL (ref 0.40–1.20)
GFR: 49.21 mL/min — ABNORMAL LOW (ref >60.00)
Glucose, Bld: 82 mg/dL (ref 70–99)
Potassium: 4.2 mEq/L (ref 3.5–5.1)
Sodium: 138 mEq/L (ref 135–145)
Total Bilirubin: 0.7 mg/dL (ref 0.2–1.2)
Total Protein: 7.7 g/dL (ref 6.0–8.3)

## 2021-06-19 LAB — CBC WITH DIFFERENTIAL/PLATELET
Basophils Absolute: 0 10*3/uL (ref 0.0–0.1)
Basophils Relative: 0.4 % (ref 0.0–3.0)
Eosinophils Absolute: 0.1 10*3/uL (ref 0.0–0.7)
Eosinophils Relative: 1.1 % (ref 0.0–5.0)
HCT: 41.5 % (ref 36.0–46.0)
Hemoglobin: 14.1 g/dL (ref 12.0–15.0)
Lymphocytes Relative: 30.8 % (ref 12.0–46.0)
Lymphs Abs: 1.9 10*3/uL (ref 0.7–4.0)
MCHC: 34 g/dL (ref 30.0–36.0)
MCV: 89.6 fl (ref 78.0–100.0)
Monocytes Absolute: 0.5 10*3/uL (ref 0.1–1.0)
Monocytes Relative: 8.3 % (ref 3.0–12.0)
Neutro Abs: 3.6 10*3/uL (ref 1.4–7.7)
Neutrophils Relative %: 59.4 % (ref 43.0–77.0)
Platelets: 228 10*3/uL (ref 150.0–400.0)
RBC: 4.63 Mil/uL (ref 3.87–5.11)
RDW: 13 % (ref 11.5–15.5)
WBC: 6.1 10*3/uL (ref 4.0–10.5)

## 2021-06-19 LAB — URINALYSIS, ROUTINE W REFLEX MICROSCOPIC
Bilirubin Urine: NEGATIVE
Hgb urine dipstick: NEGATIVE
Ketones, ur: NEGATIVE
Nitrite: NEGATIVE
RBC / HPF: NONE SEEN (ref 0–?)
Specific Gravity, Urine: <=1.005 — AB (ref 1.000–1.030)
Total Protein, Urine: NEGATIVE
Urine Glucose: NEGATIVE
Urobilinogen, UA: 0.2 (ref 0.0–1.0)
pH: 6 (ref 5.0–8.0)

## 2021-06-19 LAB — MAGNESIUM: Magnesium: 2 mg/dL (ref 1.5–2.5)

## 2021-06-19 LAB — BRAIN NATRIURETIC PEPTIDE: Pro B Natriuretic peptide (BNP): 11 pg/mL (ref 0.0–100.0)

## 2021-06-19 MED ORDER — FLUTICASONE PROPIONATE HFA 220 MCG/ACT IN AERO: 2.0000 | INHALATION_SPRAY | Freq: Every day | RESPIRATORY_TRACT | 12 refills | Status: DC

## 2021-06-19 MED ORDER — TRAMADOL HCL 50 MG PO TABS: 50.0000 mg | ORAL_TABLET | Freq: Two times a day (BID) | ORAL | 0 refills | Status: DC | PRN

## 2021-06-19 NOTE — Patient Instructions (Addendum)
Virginia Cabrera -  ? ?Great to see you! ? ?Let's check labs ? ?Call with any concerns ? ?Recommend Magnesium Citrate for cramps. Take one tab nightly. Available OTC. ? ?Thanks, ? ?Rich  ? ? ? ?If you have lab work done today you will be contacted with your lab results within the next 2 weeks.  If you have not heard from [place] then please contact us. The fastest way to get your results is to register for My Chart. ? ? ?IF you received an x-ray today, you will receive an invoice from [facility]. Please contact [facility] at [phone] with questions or concerns regarding your invoice.  ? ?IF you received labwork today, you will receive an invoice from [facility]. Please contact [facility] at [phone] with questions or concerns regarding your invoice.  ? ?Our billing staff will not be able to assist you with questions regarding bills from these companies. ? ?You will be contacted with the lab results as soon as they are available. The fastest way to get your results is to activate your My Chart account. Instructions are located on the last page of this paperwork. If you have not heard from [place] regarding the results in 2 weeks, please contact this office. ?  ? ? ?

## 2021-06-19 NOTE — Progress Notes (Signed)
? ?Acute Office Visit ? ?Subjective:  ? ? Patient ID: Virginia Cabrera, female    DOB: 05-23-1955, 66 y.o.   MRN: [id number] ? ?Chief Complaint  ?Patient presents with  ? Leg Pain  ?  Patient states she has been having some cramping in both legs recently usually comes and goes but this time has been painful. Also want to discuss some medication  ? ? ?HPI ?Patient is in today for leg pain/cramping ? ?Onset Sunday evening. Had some dizziness and nausea with this. No vomiting or LOC. No falls ?She did have some clamminess and sweating during this episode.  ?Did have company for Easter - however, no major changes to diet or other changes over the weekend  ? ?Has had some tinges of cramps since but episode mostly isolated to  ? ?Outpatient Medications Prior to Visit  ?Medication Sig Dispense Refill  ? albuterol (VENTOLIN HFA) 108 (90 Base) MCG/ACT inhaler INHALE 1 PUFF BY MOUTH EVERY 4 TO 6 HOURS AS NEEDED 18 g 2  ? amLODipine (NORVASC) 5 MG tablet Take 1 tablet (5 mg total) by mouth daily. 90 tablet 3  ? celecoxib (CELEBREX) 200 MG capsule TAKE 1 CAPSULE BY MOUTH ONCE DAILY FOR 30 DAYS    ? escitalopram (LEXAPRO) 20 MG tablet Take 1 tablet (20 mg total) by mouth at bedtime. 90 tablet 3  ? esomeprazole (NEXIUM) 40 MG capsule Take 1 capsule (40 mg total) by mouth daily at 12 noon. 90 capsule 3  ? fluticasone-salmeterol (ADVAIR DISKUS) 250-50 MCG/ACT AEPB Inhale 1 puff into the lungs in the morning and at bedtime. 60 each 11  ? losartan (COZAAR) 100 MG tablet Take 1 tablet (100 mg total) by mouth daily. 90 tablet 3  ? meclizine (ANTIVERT) 25 MG tablet Take 1 tablet (25 mg total) by mouth 3 (three) times daily as needed for dizziness. 30 tablet 0  ? methocarbamol (ROBAXIN) 500 MG tablet Take 1 tablet (500 mg total) by mouth 4 (four) times daily. 60 tablet 0  ? Psyllium 0.36 g CAPS Take 1 capsule by mouth daily. 90 capsule 3  ? spironolactone (ALDACTONE) 50 MG tablet Take 1 tablet (50 mg total) by mouth daily. 90 tablet 3  ?  traZODone (DESYREL) 100 MG tablet TK 1 T PO HS PRN    ? Vitamin D, Ergocalciferol, (DRISDOL) 1.25 MG (50000 UNIT) CAPS capsule Take 1 capsule (50,000 Units total) by mouth every 7 (seven) days. 8 capsule 0  ? traMADol (ULTRAM) 50 MG tablet Take 1 tablet (50 mg total) by mouth every 12 (twelve) hours as needed. 180 tablet 0  ? ALPRAZolam (NIRAVAM) 0.5 MG dissolvable tablet Take 1-2 tablets (0.5-1 mg total) by mouth daily as needed for anxiety (pre-procedure). (Patient not taking: Reported on 06/13/2021) 10 tablet 0  ? ?No facility-administered medications prior to visit.  ? ? ?Review of Systems  ?Constitutional: Negative.   ?HENT: Negative.    ?Eyes: Negative.   ?Respiratory:  Positive for shortness of breath (baseline).   ?Cardiovascular: Negative.   ?Gastrointestinal: Negative.   ?Endocrine: Negative.   ?Genitourinary: Negative.   ?Musculoskeletal:  Positive for myalgias. Negative for arthralgias, back pain, gait problem, joint swelling, neck pain and neck stiffness.  ?Skin: Negative.   ?Allergic/Immunologic: Negative.   ?Neurological: Negative.   ?Hematological: Negative.   ?Psychiatric/Behavioral: Negative.    ?All other systems reviewed and are negative. ? ?   ?Objective:  ?  ?BP 127/80   Pulse 76   Temp 98.1 ?F (36.7 ?  C) (Temporal)   Resp 18   Ht 5' 2.75" (1.594 m)   Wt 216 lb 12.8 oz (98.3 kg)   SpO2 100%   BMI 38.71 kg/m?  ?Physical Exam ?Vitals and nursing note reviewed.  ?Constitutional:   ?   General: She is not in acute distress. ?   Appearance: Normal appearance. She is normal weight. She is not ill-appearing, toxic-appearing or diaphoretic.  ?Cardiovascular:  ?   Rate and Rhythm: Normal rate and regular rhythm.  ?   Heart sounds: Normal heart sounds. No murmur heard. ?  No friction rub. No gallop.  ?Pulmonary:  ?   Effort: Pulmonary effort is normal. No respiratory distress.  ?   Breath sounds: Normal breath sounds. No stridor. No wheezing, rhonchi or rales.  ?Chest:  ?   Chest wall: No tenderness.   ?Skin: ?   General: Skin is warm and dry.  ?Neurological:  ?   General: No focal deficit present.  ?   Mental Status: She is alert and oriented to person, place, and time. Mental status is at baseline.  ?Psychiatric:     ?   Mood and Affect: Mood normal.     ?   Behavior: Behavior normal.     ?   Thought Content: Thought content normal.     ?   Judgment: Judgment normal.  ? ? ?No results found for any visits on 06/19/21. ? ? ?   ?Assessment & Plan:  ?1. Leg cramping ?- Comprehensive metabolic panel ?- Magnesium ?- Urinalysis, Routine w reflex microscopic ?- CK isoenzymes (brain, muscle injury) ?- CBC with Differential/Platelet ?- Brain natriuretic peptide ? ?2. Osteoarthritis of left knee, unspecified osteoarthritis type ?- traMADol (ULTRAM) 50 MG tablet; Take 1 tablet (50 mg total) by mouth every 12 (twelve) hours as needed.  Dispense: 180 tablet; Refill: 0 ? ?3. Shortness of breath ?- fluticasone (FLOVENT HFA) 220 MCG/ACT inhaler; Inhale 2 puffs into the lungs daily.  Dispense: 1 each; Refill: 12 ?- CBC with Differential/Platelet ? ?4. Dizziness ?- Brain natriuretic peptide ? ? ? ?Meds ordered this encounter  ?Medications  ? fluticasone (FLOVENT HFA) 220 MCG/ACT inhaler  ?  Sig: Inhale 2 puffs into the lungs daily.  ?  Dispense:  1 each  ?  Refill:  12  ?  Order Specific Question:   Supervising Provider  ?  Answer:   Neva Seat, JEFFREY R [[id number]]  ? traMADol (ULTRAM) 50 MG tablet  ?  Sig: Take 1 tablet (50 mg total) by mouth every 12 (twelve) hours as needed.  ?  Dispense:  180 tablet  ?  Refill:  0  ?  Order Specific Question:   Supervising Provider  ?  Answer:   Neva Seat, JEFFREY R [[id number]]  ? ? ?Return if symptoms worsen or fail to improve. ? ?PLAN ?Recommend magnesium supplement nightly ?Will check labs and follow up as indicated ?Patient encouraged to call clinic with any questions, comments, or concerns. ? ? ?Janeece Agee, NP ?

## 2021-06-19 NOTE — Progress Notes (Signed)
Can call and advise patient - potassium and magnesium are good. No heart damage per BNP. Awaiting CK results, should be back tomorrow. May have been muscle strains/spasms due to a lot of activity on Sunday.  ? ?Thanks, ? ?Rich

## 2021-06-20 ENCOUNTER — Encounter: Payer: Medicare Other | Admitting: Internal Medicine

## 2021-06-21 ENCOUNTER — Encounter: Payer: Self-pay | Admitting: Registered Nurse

## 2021-06-25 ENCOUNTER — Ambulatory Visit (HOSPITAL_COMMUNITY): Payer: Medicare Other

## 2021-06-25 LAB — EXTRA SPECIMEN

## 2021-06-25 LAB — CK ISOENZYMES

## 2021-07-02 ENCOUNTER — Other Ambulatory Visit: Payer: Medicare Other | Admitting: *Deleted

## 2021-07-02 DIAGNOSIS — I10 Essential (primary) hypertension: Secondary | ICD-10-CM | POA: Diagnosis not present

## 2021-07-02 DIAGNOSIS — R079 Chest pain, unspecified: Secondary | ICD-10-CM | POA: Diagnosis not present

## 2021-07-02 LAB — BASIC METABOLIC PANEL
BUN/Creatinine Ratio: 14 (ref 12–28)
BUN: 17 mg/dL (ref 8–27)
CO2: 23 mmol/L (ref 20–29)
Calcium: 10.3 mg/dL (ref 8.7–10.3)
Chloride: 100 mmol/L (ref 96–106)
Creatinine, Ser: 1.25 mg/dL — ABNORMAL HIGH (ref 0.57–1.00)
Glucose: 102 mg/dL — ABNORMAL HIGH (ref 70–99)
Potassium: 4.5 mmol/L (ref 3.5–5.2)
Sodium: 137 mmol/L (ref 134–144)
eGFR: 48 mL/min/{1.73_m2} — ABNORMAL LOW (ref >59)

## 2021-07-05 ENCOUNTER — Telehealth (HOSPITAL_COMMUNITY): Payer: Self-pay | Admitting: *Deleted

## 2021-07-05 NOTE — Telephone Encounter (Signed)
Left message on voicemail per DPR in reference to upcoming appointment scheduled on 07/12/2021 at 10:00 with detailed instructions given per Myocardial Perfusion Study Information Sheet for the test. LM to arrive 15 minutes early, and that it is imperative to arrive on time for appointment to keep from having the test rescheduled. If you need to cancel or reschedule your appointment, please call the office within 24 hours of your appointment. Failure to do so may result in a cancellation of your appointment, and a $50 no show fee. Phone number given for call back for any questions.  ?

## 2021-07-08 ENCOUNTER — Ambulatory Visit: Payer: Medicare (Managed Care) | Admitting: Registered Nurse

## 2021-07-11 ENCOUNTER — Telehealth (HOSPITAL_COMMUNITY): Payer: Self-pay | Admitting: Cardiovascular Disease

## 2021-07-11 NOTE — Telephone Encounter (Signed)
Patient called and cancelled Myoview for reason below: ?07/11/2021 11:47 AM GU:[id number] Cabrera, Virginia  ?Cancel Rsn: Patient (Pt will call to reschedule.) ? ?Order will be removed from the echo/nuc WQ. When patient calls back to reschedule we will reinstate the order. ? ?Thank you ? ?

## 2021-07-12 ENCOUNTER — Encounter (HOSPITAL_COMMUNITY): Payer: Self-pay

## 2021-07-12 ENCOUNTER — Encounter (HOSPITAL_COMMUNITY): Payer: Medicare Other

## 2021-07-15 ENCOUNTER — Ambulatory Visit: Payer: Medicare (Managed Care) | Admitting: Registered Nurse

## 2021-07-16 ENCOUNTER — Ambulatory Visit (INDEPENDENT_AMBULATORY_CARE_PROVIDER_SITE_OTHER): Payer: Medicare Other | Admitting: Registered Nurse

## 2021-07-16 ENCOUNTER — Encounter: Payer: Self-pay | Admitting: Registered Nurse

## 2021-07-16 VITALS — BP 126/68 | HR 66 | Temp 98.3°F | Resp 18 | Ht 63.0 in | Wt 217.2 lb

## 2021-07-16 DIAGNOSIS — R0602 Shortness of breath: Secondary | ICD-10-CM

## 2021-07-16 DIAGNOSIS — S61331A Puncture wound without foreign body of left index finger with damage to nail, initial encounter: Secondary | ICD-10-CM | POA: Diagnosis not present

## 2021-07-16 DIAGNOSIS — E559 Vitamin D deficiency, unspecified: Secondary | ICD-10-CM | POA: Diagnosis not present

## 2021-07-16 DIAGNOSIS — W540XXA Bitten by dog, initial encounter: Secondary | ICD-10-CM

## 2021-07-16 DIAGNOSIS — I10 Essential (primary) hypertension: Secondary | ICD-10-CM

## 2021-07-16 LAB — CBC WITH DIFFERENTIAL/PLATELET
Basophils Absolute: 0 10*3/uL (ref 0.0–0.1)
Basophils Relative: 0.6 % (ref 0.0–3.0)
Eosinophils Absolute: 0.1 10*3/uL (ref 0.0–0.7)
Eosinophils Relative: 0.8 % (ref 0.0–5.0)
HCT: 39.5 % (ref 36.0–46.0)
Hemoglobin: 13.5 g/dL (ref 12.0–15.0)
Lymphocytes Relative: 26 % (ref 12.0–46.0)
Lymphs Abs: 2.1 10*3/uL (ref 0.7–4.0)
MCHC: 34.2 g/dL (ref 30.0–36.0)
MCV: 89.4 fl (ref 78.0–100.0)
Monocytes Absolute: 0.6 10*3/uL (ref 0.1–1.0)
Monocytes Relative: 8 % (ref 3.0–12.0)
Neutro Abs: 5.2 10*3/uL (ref 1.4–7.7)
Neutrophils Relative %: 64.6 % (ref 43.0–77.0)
Platelets: 230 10*3/uL (ref 150.0–400.0)
RBC: 4.42 Mil/uL (ref 3.87–5.11)
RDW: 13 % (ref 11.5–15.5)
WBC: 8 10*3/uL (ref 4.0–10.5)

## 2021-07-16 LAB — COMPREHENSIVE METABOLIC PANEL
ALT: 21 U/L (ref 0–35)
AST: 21 U/L (ref 0–37)
Albumin: 4.4 g/dL (ref 3.5–5.2)
Alkaline Phosphatase: 58 U/L (ref 39–117)
BUN: 23 mg/dL (ref 6–23)
CO2: 25 mEq/L (ref 19–32)
Calcium: 9.5 mg/dL (ref 8.4–10.5)
Chloride: 100 mEq/L (ref 96–112)
Creatinine, Ser: 1.37 mg/dL — ABNORMAL HIGH (ref 0.40–1.20)
GFR: 41.11 mL/min — ABNORMAL LOW (ref >60.00)
Glucose, Bld: 96 mg/dL (ref 70–99)
Potassium: 4.1 mEq/L (ref 3.5–5.1)
Sodium: 139 mEq/L (ref 135–145)
Total Bilirubin: 0.4 mg/dL (ref 0.2–1.2)
Total Protein: 7.4 g/dL (ref 6.0–8.3)

## 2021-07-16 MED ORDER — AMOXICILLIN-POT CLAVULANATE 875-125 MG PO TABS: 1.0000 | ORAL_TABLET | Freq: Two times a day (BID) | ORAL | 0 refills | Status: DC

## 2021-07-16 MED ORDER — ALBUTEROL SULFATE HFA 108 (90 BASE) MCG/ACT IN AERS: INHALATION_SPRAY | RESPIRATORY_TRACT | 2 refills | Status: DC

## 2021-07-16 MED ORDER — VITAMIN D (ERGOCALCIFEROL) 1.25 MG (50000 UNIT) PO CAPS: 50000.0000 [IU] | ORAL_CAPSULE | ORAL | 0 refills | Status: DC

## 2021-07-16 MED ORDER — FLUTICASONE PROPIONATE HFA 220 MCG/ACT IN AERO: 2.0000 | INHALATION_SPRAY | Freq: Every day | RESPIRATORY_TRACT | 12 refills | Status: DC

## 2021-07-16 MED ORDER — SPIRONOLACTONE 50 MG PO TABS: 50.0000 mg | ORAL_TABLET | Freq: Every day | ORAL | 3 refills | Status: DC

## 2021-07-16 MED ORDER — AMLODIPINE BESYLATE 5 MG PO TABS: 5.0000 mg | ORAL_TABLET | Freq: Every day | ORAL | 3 refills | Status: DC

## 2021-07-16 MED ORDER — LOSARTAN POTASSIUM 100 MG PO TABS: 100.0000 mg | ORAL_TABLET | Freq: Every day | ORAL | 3 refills | Status: DC

## 2021-07-16 NOTE — Patient Instructions (Addendum)
Ms. Wiles -  ? ?Great to see you! ? ?Let's check labs. ? ?Refills given ? ?Let me know if infection looks like it's worsening ? ?Thanks, ? ?Rich  ? ? ? ?If you have lab work done today you will be contacted with your lab results within the next 2 weeks.  If you have not heard from [place] then please contact us. The fastest way to get your results is to register for My Chart. ? ? ?IF you received an x-ray today, you will receive an invoice from [facility]. Please contact [facility] at [phone] with questions or concerns regarding your invoice.  ? ?IF you received labwork today, you will receive an invoice from [employer]. Please contact [facility] at [phone] with questions or concerns regarding your invoice.  ? ?Our billing staff will not be able to assist you with questions regarding bills from these companies. ? ?You will be contacted with the lab results as soon as they are available. The fastest way to get your results is to activate your My Chart account. Instructions are located on the last page of this paperwork. If you have not heard from [place] regarding the results in 2 weeks, please contact this office. ?  ? ? ?

## 2021-07-16 NOTE — Assessment & Plan Note (Signed)
Well controlled. Continue current meds

## 2021-07-16 NOTE — Assessment & Plan Note (Signed)
Stable on current meds. Continue. Refills given ?

## 2021-07-16 NOTE — Progress Notes (Signed)
? ?Established Patient Office Visit ? ?Subjective:  ?Patient ID: Virginia Cabrera, female    DOB: 18-Apr-1955  Age: 66 y.o. MRN: [id number] ? ?CC:  ?Chief Complaint  ?Patient presents with  ? Follow-up  ?  Patient states she is following up on medications .  ? ? ?HPI ?Virginia Cabrera presents for follow up  ? ?Asthma ?Albuterol prn, flovent daily. ?Good effect, no AE. Hopes to continue. ? ?Hypertension: ?Patient Currently taking: amlodipine 5mg  po qd, losartan 100mg  po qd, spirinolactone 50mg  po qd ?Good effect. No AEs. ?Denies CV symptoms including: chest pain, shob, doe, headache, visual changes, fatigue, claudication, and dependent edema.  ? ?Previous readings and labs: ?BP Readings from Last 3 Encounters:  ?07/16/21 126/68  ?06/19/21 127/80  ?06/13/21 124/72  ? ?Lab Results  ?Component Value Date  ? CREATININE 1.25 (H) 07/02/2021  ? ?Dog bite ?Tip of L index finger ?Puncture wound  ?Dog did have all shots ?No infection signs. ?Has kept wound clean and dry. ? ?Outpatient Medications Prior to Visit  ?Medication Sig Dispense Refill  ? celecoxib (CELEBREX) 200 MG capsule TAKE 1 CAPSULE BY MOUTH ONCE DAILY FOR 30 DAYS    ? escitalopram (LEXAPRO) 20 MG tablet Take 1 tablet (20 mg total) by mouth at bedtime. 90 tablet 3  ? esomeprazole (NEXIUM) 40 MG capsule Take 1 capsule (40 mg total) by mouth daily at 12 noon. 90 capsule 3  ? fluticasone-salmeterol (ADVAIR DISKUS) 250-50 MCG/ACT AEPB Inhale 1 puff into the lungs in the morning and at bedtime. 60 each 11  ? meclizine (ANTIVERT) 25 MG tablet Take 1 tablet (25 mg total) by mouth 3 (three) times daily as needed for dizziness. 30 tablet 0  ? methocarbamol (ROBAXIN) 500 MG tablet Take 1 tablet (500 mg total) by mouth 4 (four) times daily. 60 tablet 0  ? Psyllium 0.36 g CAPS Take 1 capsule by mouth daily. 90 capsule 3  ? traMADol (ULTRAM) 50 MG tablet Take 1 tablet (50 mg total) by mouth every 12 (twelve) hours as needed. 180 tablet 0  ? traZODone (DESYREL) 100 MG tablet  TK 1 T PO HS PRN    ? albuterol (VENTOLIN HFA) 108 (90 Base) MCG/ACT inhaler INHALE 1 PUFF BY MOUTH EVERY 4 TO 6 HOURS AS NEEDED 18 g 2  ? amLODipine (NORVASC) 5 MG tablet Take 1 tablet (5 mg total) by mouth daily. 90 tablet 3  ? fluticasone (FLOVENT HFA) 220 MCG/ACT inhaler Inhale 2 puffs into the lungs daily. 1 each 12  ? losartan (COZAAR) 100 MG tablet Take 1 tablet (100 mg total) by mouth daily. 90 tablet 3  ? spironolactone (ALDACTONE) 50 MG tablet Take 1 tablet (50 mg total) by mouth daily. 90 tablet 3  ? Vitamin D, Ergocalciferol, (DRISDOL) 1.25 MG (50000 UNIT) CAPS capsule Take 1 capsule (50,000 Units total) by mouth every 7 (seven) days. 8 capsule 0  ? ALPRAZolam (NIRAVAM) 0.5 MG dissolvable tablet Take 1-2 tablets (0.5-1 mg total) by mouth daily as needed for anxiety (pre-procedure). (Patient not taking: Reported on 06/13/2021) 10 tablet 0  ? ?No facility-administered medications prior to visit.  ? ? ?Review of Systems  ?Constitutional: Negative.   ?HENT: Negative.    ?Eyes: Negative.   ?Respiratory: Negative.    ?Cardiovascular: Negative.   ?Gastrointestinal: Negative.   ?Endocrine: Negative.   ?Genitourinary: Negative.   ?Musculoskeletal: Negative.   ?Skin: Negative.   ?Allergic/Immunologic: Negative.   ?Neurological: Negative.   ?Hematological: Negative.   ?Psychiatric/Behavioral: Negative.    ?  All other systems reviewed and are negative. ? ?  ?Objective:  ?  ? ?BP 126/68   Pulse 66   Temp 98.3 ?F (36.8 ?C) (Temporal)   Resp 18   Ht 5\' 3"  (1.6 m)   Wt 217 lb 3.2 oz (98.5 kg)   SpO2 98%   BMI 38.48 kg/m?  ? ?Wt Readings from Last 3 Encounters:  ?07/16/21 217 lb 3.2 oz (98.5 kg)  ?06/25/21 217 lb (98.4 kg)  ?06/19/21 216 lb 12.8 oz (98.3 kg)  ? ?Physical Exam ?Vitals and nursing note reviewed.  ?Constitutional:   ?   General: She is not in acute distress. ?   Appearance: Normal appearance. She is normal weight. She is not ill-appearing, toxic-appearing or diaphoretic.  ?Cardiovascular:  ?   Rate and  Rhythm: Normal rate and regular rhythm.  ?   Heart sounds: Normal heart sounds. No murmur heard. ?  No friction rub. No gallop.  ?Pulmonary:  ?   Effort: Pulmonary effort is normal. No respiratory distress.  ?   Breath sounds: Normal breath sounds. No stridor. No wheezing, rhonchi or rales.  ?Chest:  ?   Chest wall: No tenderness.  ?Skin: ?   General: Skin is warm and dry.  ?   Findings: Lesion (wound on tip of R finger.) present.  ?Neurological:  ?   General: No focal deficit present.  ?   Mental Status: She is alert and oriented to person, place, and time. Mental status is at baseline.  ?Psychiatric:     ?   Mood and Affect: Mood normal.     ?   Behavior: Behavior normal.     ?   Thought Content: Thought content normal.     ?   Judgment: Judgment normal.  ? ? ?No results found for any visits on 07/16/21. ? ? ? ?The 10-year ASCVD risk score (Arnett DK, et al., 2019) is: 6% ? ?  ?Assessment & Plan:  ? ?Problem List Items Addressed This Visit   ? ?  ? Cardiovascular and Mediastinum  ? Essential hypertension  ? Relevant Medications  ? amLODipine (NORVASC) 5 MG tablet  ? losartan (COZAAR) 100 MG tablet  ? spironolactone (ALDACTONE) 50 MG tablet  ? ?Other Visit Diagnoses   ? ? Shortness of breath      ? Relevant Medications  ? albuterol (VENTOLIN HFA) 108 (90 Base) MCG/ACT inhaler  ? fluticasone (FLOVENT HFA) 220 MCG/ACT inhaler  ? Vitamin D deficiency      ? Relevant Medications  ? Vitamin D, Ergocalciferol, (DRISDOL) 1.25 MG (50000 UNIT) CAPS capsule  ? ?  ? ? ?Meds ordered this encounter  ?Medications  ? albuterol (VENTOLIN HFA) 108 (90 Base) MCG/ACT inhaler  ?  Sig: INHALE 1 PUFF BY MOUTH EVERY 4 TO 6 HOURS AS NEEDED  ?  Dispense:  18 g  ?  Refill:  2  ?  Order Specific Question:   Supervising Provider  ?  Answer:   Carlota Raspberry, JEFFREY R [[id number]]  ? amLODipine (NORVASC) 5 MG tablet  ?  Sig: Take 1 tablet (5 mg total) by mouth daily.  ?  Dispense:  90 tablet  ?  Refill:  3  ?  Order Specific Question:   Supervising  Provider  ?  Answer:   Carlota Raspberry, JEFFREY R [[id number]]  ? fluticasone (FLOVENT HFA) 220 MCG/ACT inhaler  ?  Sig: Inhale 2 puffs into the lungs daily.  ?  Dispense:  1 each  ?  Refill:  12  ?  Order Specific Question:   Supervising Provider  ?  Answer:   Carlota Raspberry, JEFFREY R [[id number]]  ? losartan (COZAAR) 100 MG tablet  ?  Sig: Take 1 tablet (100 mg total) by mouth daily.  ?  Dispense:  90 tablet  ?  Refill:  3  ?  Order Specific Question:   Supervising Provider  ?  Answer:   Carlota Raspberry, JEFFREY R [[id number]]  ? spironolactone (ALDACTONE) 50 MG tablet  ?  Sig: Take 1 tablet (50 mg total) by mouth daily.  ?  Dispense:  90 tablet  ?  Refill:  3  ?  Dose Change  ?  Order Specific Question:   Supervising Provider  ?  Answer:   Carlota Raspberry, JEFFREY R [[id number]]  ? Vitamin D, Ergocalciferol, (DRISDOL) 1.25 MG (50000 UNIT) CAPS capsule  ?  Sig: Take 1 capsule (50,000 Units total) by mouth every 7 (seven) days.  ?  Dispense:  8 capsule  ?  Refill:  0  ?  Order Specific Question:   Supervising Provider  ?  Answer:   Carlota Raspberry, JEFFREY R [[id number]]  ? ? ?No follow-ups on file.  ? ?PLAN ?Augmentin for dog bite. Follow up if worsening or failing to improve. ?See problem based charting.  ? ?Maximiano Coss, NP ?

## 2021-08-27 ENCOUNTER — Encounter: Payer: Self-pay | Admitting: Registered Nurse

## 2021-09-24 ENCOUNTER — Ambulatory Visit (INDEPENDENT_AMBULATORY_CARE_PROVIDER_SITE_OTHER): Payer: Medicare Other | Admitting: Registered Nurse

## 2021-09-24 ENCOUNTER — Other Ambulatory Visit: Payer: Self-pay

## 2021-09-24 ENCOUNTER — Encounter: Payer: Self-pay | Admitting: Registered Nurse

## 2021-09-24 VITALS — BP 130/74 | HR 70 | Temp 98.2°F | Resp 18 | Ht 63.0 in | Wt 217.8 lb

## 2021-09-24 DIAGNOSIS — R0602 Shortness of breath: Secondary | ICD-10-CM | POA: Diagnosis not present

## 2021-09-24 DIAGNOSIS — F419 Anxiety disorder, unspecified: Secondary | ICD-10-CM

## 2021-09-24 DIAGNOSIS — E559 Vitamin D deficiency, unspecified: Secondary | ICD-10-CM

## 2021-09-24 DIAGNOSIS — Z1329 Encounter for screening for other suspected endocrine disorder: Secondary | ICD-10-CM | POA: Diagnosis not present

## 2021-09-24 DIAGNOSIS — Z130 Encounter for screening for diseases of the blood and blood-forming organs and certain disorders involving the immune mechanism: Secondary | ICD-10-CM | POA: Diagnosis not present

## 2021-09-24 DIAGNOSIS — K219 Gastro-esophageal reflux disease without esophagitis: Secondary | ICD-10-CM | POA: Diagnosis not present

## 2021-09-24 DIAGNOSIS — M1712 Unilateral primary osteoarthritis, left knee: Secondary | ICD-10-CM | POA: Diagnosis not present

## 2021-09-24 DIAGNOSIS — M542 Cervicalgia: Secondary | ICD-10-CM | POA: Diagnosis not present

## 2021-09-24 DIAGNOSIS — I10 Essential (primary) hypertension: Secondary | ICD-10-CM | POA: Diagnosis not present

## 2021-09-24 DIAGNOSIS — Z13228 Encounter for screening for other metabolic disorders: Secondary | ICD-10-CM

## 2021-09-24 LAB — COMPREHENSIVE METABOLIC PANEL
ALT: 25 U/L (ref 0–35)
AST: 25 U/L (ref 0–37)
Albumin: 4.8 g/dL (ref 3.5–5.2)
Alkaline Phosphatase: 55 U/L (ref 39–117)
BUN: 17 mg/dL (ref 6–23)
CO2: 26 mEq/L (ref 19–32)
Calcium: 9.9 mg/dL (ref 8.4–10.5)
Chloride: 103 mEq/L (ref 96–112)
Creatinine, Ser: 1.2 mg/dL (ref 0.40–1.20)
GFR: 48.13 mL/min — ABNORMAL LOW (ref >60.00)
Glucose, Bld: 89 mg/dL (ref 70–99)
Potassium: 4.3 mEq/L (ref 3.5–5.1)
Sodium: 138 mEq/L (ref 135–145)
Total Bilirubin: 0.5 mg/dL (ref 0.2–1.2)
Total Protein: 8 g/dL (ref 6.0–8.3)

## 2021-09-24 LAB — CBC WITH DIFFERENTIAL/PLATELET
Basophils Absolute: 0 10*3/uL (ref 0.0–0.1)
Basophils Relative: 0.4 % (ref 0.0–3.0)
Eosinophils Absolute: 0.1 10*3/uL (ref 0.0–0.7)
Eosinophils Relative: 1.1 % (ref 0.0–5.0)
HCT: 40.9 % (ref 36.0–46.0)
Hemoglobin: 13.8 g/dL (ref 12.0–15.0)
Lymphocytes Relative: 31.5 % (ref 12.0–46.0)
Lymphs Abs: 2.5 10*3/uL (ref 0.7–4.0)
MCHC: 33.7 g/dL (ref 30.0–36.0)
MCV: 91.1 fl (ref 78.0–100.0)
Monocytes Absolute: 0.6 10*3/uL (ref 0.1–1.0)
Monocytes Relative: 7.8 % (ref 3.0–12.0)
Neutro Abs: 4.6 10*3/uL (ref 1.4–7.7)
Neutrophils Relative %: 59.2 % (ref 43.0–77.0)
Platelets: 232 10*3/uL (ref 150.0–400.0)
RBC: 4.49 Mil/uL (ref 3.87–5.11)
RDW: 13.1 % (ref 11.5–15.5)
WBC: 7.8 10*3/uL (ref 4.0–10.5)

## 2021-09-24 LAB — LIPID PANEL
Cholesterol: 212 mg/dL — ABNORMAL HIGH (ref 0–200)
HDL: 58.4 mg/dL (ref >39.00)
LDL Cholesterol: 116 mg/dL — ABNORMAL HIGH (ref 0–99)
NonHDL: 153.76
Total CHOL/HDL Ratio: 4
Triglycerides: 191 mg/dL — ABNORMAL HIGH (ref 0.0–149.0)
VLDL: 38.2 mg/dL (ref 0.0–40.0)

## 2021-09-24 LAB — VITAMIN D 25 HYDROXY (VIT D DEFICIENCY, FRACTURES): VITD: 24.08 ng/mL — ABNORMAL LOW (ref 30.00–100.00)

## 2021-09-24 LAB — HEMOGLOBIN A1C: Hgb A1c MFr Bld: 5.5 % (ref 4.6–6.5)

## 2021-09-24 LAB — TSH: TSH: 1.11 u[IU]/mL (ref 0.35–5.50)

## 2021-09-24 MED ORDER — HYDROCODONE-ACETAMINOPHEN 5-300 MG PO TABS: 1.0000 | ORAL_TABLET | Freq: Three times a day (TID) | ORAL | 0 refills | Status: DC | PRN

## 2021-09-24 MED ORDER — ESOMEPRAZOLE MAGNESIUM 40 MG PO CPDR: 40.0000 mg | DELAYED_RELEASE_CAPSULE | Freq: Every day | ORAL | 3 refills | Status: DC

## 2021-09-24 MED ORDER — FLUTICASONE PROPIONATE HFA 220 MCG/ACT IN AERO: 2.0000 | INHALATION_SPRAY | Freq: Every day | RESPIRATORY_TRACT | 12 refills | Status: DC

## 2021-09-24 MED ORDER — TRAMADOL HCL 50 MG PO TABS: 50.0000 mg | ORAL_TABLET | Freq: Two times a day (BID) | ORAL | 0 refills | Status: DC | PRN

## 2021-09-24 MED ORDER — SPIRONOLACTONE 50 MG PO TABS: 50.0000 mg | ORAL_TABLET | Freq: Every day | ORAL | 3 refills | Status: DC

## 2021-09-24 MED ORDER — BACLOFEN 20 MG PO TABS: 20.0000 mg | ORAL_TABLET | Freq: Three times a day (TID) | ORAL | 0 refills | Status: AC

## 2021-09-24 MED ORDER — AMLODIPINE BESYLATE 5 MG PO TABS: 5.0000 mg | ORAL_TABLET | Freq: Every day | ORAL | 3 refills | Status: DC

## 2021-09-24 MED ORDER — LOSARTAN POTASSIUM 100 MG PO TABS: 100.0000 mg | ORAL_TABLET | Freq: Every day | ORAL | 3 refills | Status: DC

## 2021-09-24 MED ORDER — METOCLOPRAMIDE HCL 5 MG PO TABS: 5.0000 mg | ORAL_TABLET | Freq: Four times a day (QID) | ORAL | 0 refills | Status: DC

## 2021-09-24 MED ORDER — ESCITALOPRAM OXALATE 20 MG PO TABS: 20.0000 mg | ORAL_TABLET | Freq: Every day | ORAL | 3 refills | Status: DC

## 2021-09-24 MED ORDER — ALBUTEROL SULFATE HFA 108 (90 BASE) MCG/ACT IN AERS: INHALATION_SPRAY | RESPIRATORY_TRACT | 2 refills | Status: DC

## 2021-09-24 NOTE — Patient Instructions (Addendum)
Ms. Lansberry -   Dr. Jon Billings and Dr. Clearence Ped, PA Jacquiline Doe, MD Edwina Barth, MD Letta Moynahan Early, NP Jiles Prows, DNP   Thank you for letting me take part in your care. It has been great knowing you and Alinda Money - I wish you both the best!  Thanks,  Rich   If you have lab work done today you will be contacted with your lab results within the next 2 weeks.  If you have not heard from [place] then please contact us. The fastest way to get your results is to register for My Chart.   IF you received an x-ray today, you will receive an invoice from [facility]. Please contact [facility] at [phone] with questions or concerns regarding your invoice.   IF you received labwork today, you will receive an invoice from [place]. Please contact [facility] at [phone] with questions or concerns regarding your invoice.   Our billing staff will not be able to assist you with questions regarding bills from these companies.  You will be contacted with the lab results as soon as they are available. The fastest way to get your results is to activate your My Chart account. Instructions are located on the last page of this paperwork. If you have not heard from [place] regarding the results in 2 weeks, please contact this office.

## 2021-09-24 NOTE — Progress Notes (Signed)
Established Patient Office Visit  Subjective:  Patient ID: Virginia Cabrera, female    DOB: 1955/06/29  Age: 66 y.o. MRN: [id number]  CC:  Chief Complaint  Patient presents with   Pain    Patient states she has been having some neck pain over the last week . She needs a medication refill.    HPI Virginia Cabrera presents for neck pain  Ongoing 1 week. No acute injury. Similar to muscle strain in past. Full ROM, pain with rotation at times. No crepitus. No neuro symptoms. Has taken tramadol with limited effect. She is concerned for OA, already planning on having TKA.  Otherwise, notes med refill needed:  Hypertension: Patient Currently taking: amlodipine 5mg  po qd, losartan 100mg  po qd, spironolactone 50mg  po qd Good effect. No AEs. Denies CV symptoms including: chest pain, shob, doe, headache, visual changes, fatigue, claudication, and dependent edema.   Previous readings and labs: BP Readings from Last 3 Encounters:  09/24/21 130/74  07/16/21 126/68  06/19/21 127/80   Lab Results  Component Value Date   CREATININE 1.20 09/24/2021    Vitamin D deficiency Has warranted 50,000 unit weekly replenishment in the past Doing well at this time, no acute symptoms. Would like to check levels and replenish as indicated.  BPPV Meclizine use for sparing episodes. No AE Needs refill - no active episode at moment but wants to be prepared.  Anxiety Doing very well with lexapro 20mg  po qhs. Clonazepam 0.5mg  po bid prn for breakthrough anxiety.  No ae. Hopes to continue.   Outpatient Medications Prior to Visit  Medication Sig Dispense Refill   amoxicillin-clavulanate (AUGMENTIN) 875-125 MG tablet Take 1 tablet by mouth 2 (two) times daily. 20 tablet 0   celecoxib (CELEBREX) 200 MG capsule TAKE 1 CAPSULE BY MOUTH ONCE DAILY FOR 30 DAYS     fluticasone-salmeterol (ADVAIR DISKUS) 250-50 MCG/ACT AEPB Inhale 1 puff into the lungs in the morning and at bedtime. 60 each 11   meclizine  (ANTIVERT) 25 MG tablet Take 1 tablet (25 mg total) by mouth 3 (three) times daily as needed for dizziness. 30 tablet 0   Psyllium 0.36 g CAPS Take 1 capsule by mouth daily. 90 capsule 3   traZODone (DESYREL) 100 MG tablet TK 1 T PO HS PRN     Vitamin D, Ergocalciferol, (DRISDOL) 1.25 MG (50000 UNIT) CAPS capsule Take 1 capsule (50,000 Units total) by mouth every 7 (seven) days. 8 capsule 0   albuterol (VENTOLIN HFA) 108 (90 Base) MCG/ACT inhaler INHALE 1 PUFF BY MOUTH EVERY 4 TO 6 HOURS AS NEEDED 18 g 2   amLODipine (NORVASC) 5 MG tablet Take 1 tablet (5 mg total) by mouth daily. 90 tablet 3   escitalopram (LEXAPRO) 20 MG tablet Take 1 tablet (20 mg total) by mouth at bedtime. 90 tablet 3   esomeprazole (NEXIUM) 40 MG capsule Take 1 capsule (40 mg total) by mouth daily at 12 noon. 90 capsule 3   fluticasone (FLOVENT HFA) 220 MCG/ACT inhaler Inhale 2 puffs into the lungs daily. 1 each 12   losartan (COZAAR) 100 MG tablet Take 1 tablet (100 mg total) by mouth daily. 90 tablet 3   spironolactone (ALDACTONE) 50 MG tablet Take 1 tablet (50 mg total) by mouth daily. 90 tablet 3   traMADol (ULTRAM) 50 MG tablet Take 1 tablet (50 mg total) by mouth every 12 (twelve) hours as needed. 180 tablet 0   methocarbamol (ROBAXIN) 500 MG tablet Take 1 tablet (500  mg total) by mouth 4 (four) times daily. (Patient not taking: Reported on 09/24/2021) 60 tablet 0   No facility-administered medications prior to visit.    Review of Systems  Constitutional: Negative.   HENT: Negative.    Eyes: Negative.   Respiratory: Negative.    Cardiovascular: Negative.   Gastrointestinal: Negative.   Genitourinary: Negative.   Musculoskeletal:  Positive for neck pain and neck stiffness.  Skin: Negative.   Neurological: Negative.   Psychiatric/Behavioral: Negative.    All other systems reviewed and are negative.     Objective:     BP 130/74   Pulse 70   Temp 98.2 F (36.8 C) (Temporal)   Resp 18   Ht 5\' 3"  (1.6 m)    Wt 217 lb 12.8 oz (98.8 kg)   SpO2 98%   BMI 38.58 kg/m   Wt Readings from Last 3 Encounters:  09/24/21 217 lb 12.8 oz (98.8 kg)  07/16/21 217 lb 3.2 oz (98.5 kg)  06/25/21 217 lb (98.4 kg)   Physical Exam Vitals and nursing note reviewed.  Constitutional:      General: She is not in acute distress.    Appearance: Normal appearance. She is normal weight. She is not ill-appearing, toxic-appearing or diaphoretic.  Neck:     Vascular: No carotid bruit.  Cardiovascular:     Rate and Rhythm: Normal rate and regular rhythm.     Heart sounds: Normal heart sounds. No murmur heard.    No friction rub. No gallop.  Pulmonary:     Effort: Pulmonary effort is normal. No respiratory distress.     Breath sounds: Normal breath sounds. No stridor. No wheezing, rhonchi or rales.  Chest:     Chest wall: No tenderness.  Musculoskeletal:     Cervical back: Normal range of motion. Tenderness (mild paraspinal) present. No rigidity.  Lymphadenopathy:     Cervical: No cervical adenopathy.  Skin:    General: Skin is warm and dry.  Neurological:     General: No focal deficit present.     Mental Status: She is alert and oriented to person, place, and time. Mental status is at baseline.  Psychiatric:        Mood and Affect: Mood normal.        Behavior: Behavior normal.        Thought Content: Thought content normal.        Judgment: Judgment normal.     Results for orders placed or performed in visit on 09/24/21  CBC with Differential/Platelet  Result Value Ref Range   WBC 7.8 4.0 - 10.5 K/uL   RBC 4.49 3.87 - 5.11 Mil/uL   Hemoglobin 13.8 12.0 - 15.0 g/dL   HCT 09/26/21 71.0 - 62.6 %   MCV 91.1 78.0 - 100.0 fl   MCHC 33.7 30.0 - 36.0 g/dL   RDW 94.8 54.6 - 27.0 %   Platelets 232.0 150.0 - 400.0 K/uL   Neutrophils Relative % 59.2 43.0 - 77.0 %   Lymphocytes Relative 31.5 12.0 - 46.0 %   Monocytes Relative 7.8 3.0 - 12.0 %   Eosinophils Relative 1.1 0.0 - 5.0 %   Basophils Relative 0.4  0.0 - 3.0 %   Neutro Abs 4.6 1.4 - 7.7 K/uL   Lymphs Abs 2.5 0.7 - 4.0 K/uL   Monocytes Absolute 0.6 0.1 - 1.0 K/uL   Eosinophils Absolute 0.1 0.0 - 0.7 K/uL   Basophils Absolute 0.0 0.0 - 0.1 K/uL  Comprehensive metabolic panel  Result Value Ref Range   Sodium 138 135 - 145 mEq/L   Potassium 4.3 3.5 - 5.1 mEq/L   Chloride 103 96 - 112 mEq/L   CO2 26 19 - 32 mEq/L   Glucose, Bld 89 70 - 99 mg/dL   BUN 17 6 - 23 mg/dL   Creatinine, Ser 7.09 0.40 - 1.20 mg/dL   Total Bilirubin 0.5 0.2 - 1.2 mg/dL   Alkaline Phosphatase 55 39 - 117 U/L   AST 25 0 - 37 U/L   ALT 25 0 - 35 U/L   Total Protein 8.0 6.0 - 8.3 g/dL   Albumin 4.8 3.5 - 5.2 g/dL   GFR 62.83 (L) >66.29 mL/min   Calcium 9.9 8.4 - 10.5 mg/dL  Hemoglobin U7M  Result Value Ref Range   Hgb A1c MFr Bld 5.5 4.6 - 6.5 %  Lipid panel  Result Value Ref Range   Cholesterol 212 (H) 0 - 200 mg/dL   Triglycerides 546.5 (H) 0.0 - 149.0 mg/dL   HDL 03.54 >65.68 mg/dL   VLDL 12.7 0.0 - 51.7 mg/dL   LDL Cholesterol 001 (H) 0 - 99 mg/dL   Total CHOL/HDL Ratio 4    NonHDL 153.76   TSH  Result Value Ref Range   TSH 1.11 0.35 - 5.50 uIU/mL  Vitamin D (25 hydroxy)  Result Value Ref Range   VITD 24.08 (L) 30.00 - 100.00 ng/mL      The 10-year ASCVD risk score (Arnett DK, et al., 2019) is: 6.3%    Assessment & Plan:   Problem List Items Addressed This Visit       Cardiovascular and Mediastinum   Essential hypertension    Well controlled on current regimen. Plan to continue.       Relevant Medications   amLODipine (NORVASC) 5 MG tablet   losartan (COZAAR) 100 MG tablet   spironolactone (ALDACTONE) 50 MG tablet   Other Relevant Orders   CBC with Differential/Platelet (Completed)   Comprehensive metabolic panel (Completed)   Lipid panel (Completed)   TSH (Completed)     Digestive   Gastroesophageal reflux disease    Stable with ppi use, diet modification. Can continue.      Relevant Medications   esomeprazole  (NEXIUM) 40 MG capsule   metoCLOPramide (REGLAN) 5 MG tablet     Musculoskeletal and Integument   Osteoarthritis of left knee    Can continue antiinflammatories and home exercises. Advise to follow up with ortho as she has been told she would need TKA.      Relevant Medications   traMADol (ULTRAM) 50 MG tablet   baclofen (LIORESAL) 20 MG tablet     Other   Shortness of breath    Continue prn albuterol use.      Relevant Medications   albuterol (VENTOLIN HFA) 108 (90 Base) MCG/ACT inhaler   fluticasone (FLOVENT HFA) 220 MCG/ACT inhaler   Vitamin D deficiency    Check labs and replenish as indicated.      Relevant Orders   Vitamin D (25 hydroxy) (Completed)   Acute neck pain - Primary    Will send opioid analgesic and baclofen. Reviewed risks, benefits, and side effects, pt voices understanding. Advised to stretch, rest, heat/ice. Consider PT or ortho if worsening or failing to improve. No indications to image at this time.      Relevant Medications   metoCLOPramide (REGLAN) 5 MG tablet   baclofen (LIORESAL) 20 MG tablet   Other Visit Diagnoses  Anxiety       Relevant Medications   escitalopram (LEXAPRO) 20 MG tablet   Screening for endocrine, metabolic and immunity disorder       Relevant Orders   Hemoglobin A1c (Completed)   TSH (Completed)       Meds ordered this encounter  Medications   amLODipine (NORVASC) 5 MG tablet    Sig: Take 1 tablet (5 mg total) by mouth daily.    Dispense:  90 tablet    Refill:  3    Order Specific Question:   Supervising Provider    Answer:   Neva SeatGREENE, JEFFREY R [[id number]]   losartan (COZAAR) 100 MG tablet    Sig: Take 1 tablet (100 mg total) by mouth daily.    Dispense:  90 tablet    Refill:  3    Order Specific Question:   Supervising Provider    Answer:   Neva SeatGREENE, JEFFREY R [[id number]]   esomeprazole (NEXIUM) 40 MG capsule    Sig: Take 1 capsule (40 mg total) by mouth daily at 12 noon.    Dispense:  90 capsule    Refill:  3     Order Specific Question:   Supervising Provider    Answer:   Neva SeatGREENE, JEFFREY R [[id number]]   albuterol (VENTOLIN HFA) 108 (90 Base) MCG/ACT inhaler    Sig: INHALE 1 PUFF BY MOUTH EVERY 4 TO 6 HOURS AS NEEDED    Dispense:  18 g    Refill:  2    Order Specific Question:   Supervising Provider    Answer:   Neva SeatGREENE, JEFFREY R [[id number]]   escitalopram (LEXAPRO) 20 MG tablet    Sig: Take 1 tablet (20 mg total) by mouth at bedtime.    Dispense:  90 tablet    Refill:  3    Order Specific Question:   Supervising Provider    Answer:   Neva SeatGREENE, JEFFREY R [[id number]]   fluticasone (FLOVENT HFA) 220 MCG/ACT inhaler    Sig: Inhale 2 puffs into the lungs daily.    Dispense:  1 each    Refill:  12    Order Specific Question:   Supervising Provider    Answer:   Neva SeatGREENE, JEFFREY R [[id number]]   spironolactone (ALDACTONE) 50 MG tablet    Sig: Take 1 tablet (50 mg total) by mouth daily.    Dispense:  90 tablet    Refill:  3    Dose Change    Order Specific Question:   Supervising Provider    Answer:   Neva SeatGREENE, JEFFREY R [[id number]]   traMADol (ULTRAM) 50 MG tablet    Sig: Take 1 tablet (50 mg total) by mouth every 12 (twelve) hours as needed.    Dispense:  180 tablet    Refill:  0    Order Specific Question:   Supervising Provider    Answer:   Neva SeatGREENE, JEFFREY R [[id number]]   DISCONTD: HYDROcodone-Acetaminophen 5-300 MG TABS    Sig: Take 1 tablet by mouth 3 (three) times daily as needed.    Dispense:  30 tablet    Refill:  0    Order Specific Question:   Supervising Provider    Answer:   Neva SeatGREENE, JEFFREY R [[id number]]   metoCLOPramide (REGLAN) 5 MG tablet    Sig: Take 1 tablet (5 mg total) by mouth 4 (four) times daily.    Dispense:  60 tablet    Refill:  0    Order Specific Question:   Supervising Provider  Answer:   Neva Seat, JEFFREY R [[id number]]   baclofen (LIORESAL) 20 MG tablet    Sig: Take 1 tablet (20 mg total) by mouth 3 (three) times daily.    Dispense:  30 each    Refill:  0    Order Specific Question:   Supervising  Provider    Answer:   Neva Seat, JEFFREY R [[id number]]    Return if symptoms worsen or fail to improve.    Janeece Agee, NP

## 2021-09-25 ENCOUNTER — Encounter: Payer: Self-pay | Admitting: Registered Nurse

## 2021-09-25 ENCOUNTER — Telehealth: Payer: Self-pay | Admitting: Registered Nurse

## 2021-09-25 NOTE — Telephone Encounter (Signed)
Pt had an appt yesterday. Pt called stating that her pharmacy is out of Hydrocodone-Acetaminophen 5-300 mg. Pharmacy only has Hydrocodone-Acetaminophen 5-325 mg. Can medication be switched ? If so Pt is at [facility] on [address] waiting for medication.

## 2021-09-26 ENCOUNTER — Other Ambulatory Visit: Payer: Self-pay | Admitting: Registered Nurse

## 2021-09-26 DIAGNOSIS — M1712 Unilateral primary osteoarthritis, left knee: Secondary | ICD-10-CM

## 2021-09-26 DIAGNOSIS — F411 Generalized anxiety disorder: Secondary | ICD-10-CM

## 2021-09-26 MED ORDER — HYDROCODONE-ACETAMINOPHEN 5-325 MG PO TABS: 1.0000 | ORAL_TABLET | Freq: Four times a day (QID) | ORAL | 0 refills | Status: DC | PRN

## 2021-09-26 NOTE — Telephone Encounter (Signed)
This concern has been previously addressed by myself and/or another provider.  If they patient has ongoing concerns, they can contact me at their convenience.  Thank you,  Rich Zaila Crew, NP 

## 2021-10-09 ENCOUNTER — Ambulatory Visit (INDEPENDENT_AMBULATORY_CARE_PROVIDER_SITE_OTHER): Payer: Medicare Other | Admitting: Registered Nurse

## 2021-10-09 ENCOUNTER — Other Ambulatory Visit: Payer: Self-pay

## 2021-10-09 VITALS — BP 128/72 | HR 80 | Temp 98.3°F | Resp 18 | Ht 63.0 in

## 2021-10-09 DIAGNOSIS — M542 Cervicalgia: Secondary | ICD-10-CM | POA: Insufficient documentation

## 2021-10-09 DIAGNOSIS — Z636 Dependent relative needing care at home: Secondary | ICD-10-CM

## 2021-10-09 DIAGNOSIS — M1712 Unilateral primary osteoarthritis, left knee: Secondary | ICD-10-CM | POA: Insufficient documentation

## 2021-10-09 DIAGNOSIS — E559 Vitamin D deficiency, unspecified: Secondary | ICD-10-CM | POA: Insufficient documentation

## 2021-10-09 DIAGNOSIS — F419 Anxiety disorder, unspecified: Secondary | ICD-10-CM

## 2021-10-09 MED ORDER — HYDROXYZINE HCL 10 MG PO TABS: 10.0000 mg | ORAL_TABLET | Freq: Three times a day (TID) | ORAL | 11 refills | Status: DC | PRN

## 2021-10-09 NOTE — Patient Instructions (Signed)
° ° ° °  If you have lab work done today you will be contacted with your lab results within the next 2 weeks.  If you have not heard from us then please contact us. The fastest way to get your results is to register for My Chart. ° ° °IF you received an x-ray today, you will receive an invoice from [facility]. Please contact  Radiology at [phone] with questions or concerns regarding your invoice.  ° °IF you received labwork today, you will receive an invoice from [facility]. Please contact [facility] at [phone] with questions or concerns regarding your invoice.  ° °Our billing staff will not be able to assist you with questions regarding bills from these companies. ° °You will be contacted with the lab results as soon as they are available. The fastest way to get your results is to activate your My Chart account. Instructions are located on the last page of this paperwork. If you have not heard from us regarding the results in 2 weeks, please contact this office. °  ° ° ° °

## 2021-10-09 NOTE — Assessment & Plan Note (Signed)
Can continue antiinflammatories and home exercises. Advise to follow up with ortho as she has been told she would need TKA.

## 2021-10-09 NOTE — Assessment & Plan Note (Signed)
Will send opioid analgesic and baclofen. Reviewed risks, benefits, and side effects, pt voices understanding. Advised to stretch, rest, heat/ice. Consider PT or ortho if worsening or failing to improve. No indications to image at this time.

## 2021-10-09 NOTE — Assessment & Plan Note (Signed)
Stable with ppi use, diet modification. Can continue.

## 2021-10-09 NOTE — Assessment & Plan Note (Signed)
Check labs and replenish as indicated. 

## 2021-10-09 NOTE — Assessment & Plan Note (Signed)
Continue prn albuterol use.

## 2021-10-09 NOTE — Assessment & Plan Note (Signed)
Well controlled on current regimen. Plan to continue.

## 2021-10-09 NOTE — Progress Notes (Signed)
Established Patient Office Visit  Subjective:  Patient ID: Virginia Cabrera, female    DOB: 1955/08/05  Age: 66 y.o. MRN: [id number]  CC:  Chief Complaint  Patient presents with   Referral    Patient states she is here for a referral.and discuss medication refill    HPI Virginia Cabrera presents for referral, med refill  anxiety Worsening. More acute than previous Still on escitalopram, which has helped Used hydroxyzine in the past with good effect, no AE.  She is interested in starting counseling again - had done until 1-2 years ago when her insurance situation changed.   Neck pain Ongoing, no new complications Feels it's worsened by anxiety and recent long drive.  She has pain in R trapezius Has done well with sling in past, would like to try this again   Outpatient Medications Prior to Visit  Medication Sig Dispense Refill   albuterol (VENTOLIN HFA) 108 (90 Base) MCG/ACT inhaler INHALE 1 PUFF BY MOUTH EVERY 4 TO 6 HOURS AS NEEDED 18 g 2   amLODipine (NORVASC) 5 MG tablet Take 1 tablet (5 mg total) by mouth daily. 90 tablet 3   amoxicillin-clavulanate (AUGMENTIN) 875-125 MG tablet Take 1 tablet by mouth 2 (two) times daily. 20 tablet 0   baclofen (LIORESAL) 20 MG tablet Take 1 tablet (20 mg total) by mouth 3 (three) times daily. 30 each 0   celecoxib (CELEBREX) 200 MG capsule TAKE 1 CAPSULE BY MOUTH ONCE DAILY FOR 30 DAYS     clonazePAM (KLONOPIN) 0.5 MG tablet Take 1 tablet by mouth twice daily as needed for anxiety 60 tablet 0   escitalopram (LEXAPRO) 20 MG tablet Take 1 tablet (20 mg total) by mouth at bedtime. 90 tablet 3   esomeprazole (NEXIUM) 40 MG capsule Take 1 capsule (40 mg total) by mouth daily at 12 noon. 90 capsule 3   fluticasone (FLOVENT HFA) 220 MCG/ACT inhaler Inhale 2 puffs into the lungs daily. 1 each 12   fluticasone-salmeterol (ADVAIR DISKUS) 250-50 MCG/ACT AEPB Inhale 1 puff into the lungs in the morning and at bedtime. 60 each 11    HYDROcodone-acetaminophen (NORCO/VICODIN) 5-325 MG tablet Take 1 tablet by mouth every 6 (six) hours as needed for moderate pain. 30 tablet 0   losartan (COZAAR) 100 MG tablet Take 1 tablet (100 mg total) by mouth daily. 90 tablet 3   meclizine (ANTIVERT) 25 MG tablet Take 1 tablet (25 mg total) by mouth 3 (three) times daily as needed for dizziness. 30 tablet 0   metoCLOPramide (REGLAN) 5 MG tablet Take 1 tablet (5 mg total) by mouth 4 (four) times daily. 60 tablet 0   Psyllium 0.36 g CAPS Take 1 capsule by mouth daily. 90 capsule 3   spironolactone (ALDACTONE) 50 MG tablet Take 1 tablet (50 mg total) by mouth daily. 90 tablet 3   traMADol (ULTRAM) 50 MG tablet Take 1 tablet (50 mg total) by mouth every 12 (twelve) hours as needed. 180 tablet 0   traZODone (DESYREL) 100 MG tablet TK 1 T PO HS PRN     Vitamin D, Ergocalciferol, (DRISDOL) 1.25 MG (50000 UNIT) CAPS capsule Take 1 capsule (50,000 Units total) by mouth every 7 (seven) days. 8 capsule 0   No facility-administered medications prior to visit.    Review of Systems  Constitutional: Negative.   HENT: Negative.    Eyes: Negative.   Respiratory: Negative.    Cardiovascular: Negative.   Gastrointestinal: Negative.   Genitourinary: Negative.  Musculoskeletal:  Positive for neck pain and neck stiffness.  Skin: Negative.   Neurological: Negative.   Psychiatric/Behavioral:  The patient is nervous/anxious.   All other systems reviewed and are negative.     Objective:     BP 128/72   Pulse 80   Temp 98.3 F (36.8 C) (Temporal)   Resp 18   Ht 5\' 3"  (1.6 m)   SpO2 98%   BMI 38.58 kg/m   Wt Readings from Last 3 Encounters:  09/24/21 217 lb 12.8 oz (98.8 kg)  07/16/21 217 lb 3.2 oz (98.5 kg)  06/25/21 217 lb (98.4 kg)   Physical Exam Vitals and nursing note reviewed.  Constitutional:      General: She is not in acute distress.    Appearance: Normal appearance. She is normal weight. She is not ill-appearing,  toxic-appearing or diaphoretic.  Cardiovascular:     Rate and Rhythm: Normal rate and regular rhythm.     Heart sounds: Normal heart sounds. No murmur heard.    No friction rub. No gallop.  Pulmonary:     Effort: Pulmonary effort is normal. No respiratory distress.     Breath sounds: Normal breath sounds. No stridor. No wheezing, rhonchi or rales.  Chest:     Chest wall: No tenderness.  Musculoskeletal:     Cervical back: Normal range of motion and neck supple. Tenderness (R trapezius) present.  Skin:    General: Skin is warm and dry.  Neurological:     General: No focal deficit present.     Mental Status: She is alert and oriented to person, place, and time. Mental status is at baseline.  Psychiatric:        Mood and Affect: Mood normal.        Behavior: Behavior normal.        Thought Content: Thought content normal.        Judgment: Judgment normal.     No results found for any visits on 10/09/21.    The 10-year ASCVD risk score (Arnett DK, et al., 2019) is: 6.1%    Assessment & Plan:   Problem List Items Addressed This Visit   None Visit Diagnoses     Acute anxiety    -  Primary   Relevant Medications   hydrOXYzine (ATARAX) 10 MG tablet   Other Relevant Orders   Ambulatory referral to Psychology   Caregiver burden       Relevant Orders   Ambulatory referral to Psychology       Meds ordered this encounter  Medications   hydrOXYzine (ATARAX) 10 MG tablet    Sig: Take 1 tablet (10 mg total) by mouth 3 (three) times daily as needed.    Dispense:  90 tablet    Refill:  11    Order Specific Question:   Supervising Provider    Answer:   2020, JEFFREY R [[id number]]    No follow-ups on file.   PLAN Given sling for R arm to take pressure off of shoulder/neck. She will continue medication and stretching. Will follow up with ortho Refer to counseling Can restart hydroxyzine po tid prn Patient encouraged to call clinic with any questions, comments, or  concerns.   Neva Seat, NP

## 2022-01-16 ENCOUNTER — Encounter: Payer: Medicare Other | Admitting: Registered Nurse

## 2022-03-17 ENCOUNTER — Ambulatory Visit (INDEPENDENT_AMBULATORY_CARE_PROVIDER_SITE_OTHER): Payer: Medicare Other | Admitting: Physician Assistant

## 2022-03-17 ENCOUNTER — Encounter: Payer: Self-pay | Admitting: Physician Assistant

## 2022-03-17 VITALS — BP 112/80 | HR 73 | Temp 97.7°F | Ht 63.0 in | Wt 213.2 lb

## 2022-03-17 DIAGNOSIS — I10 Essential (primary) hypertension: Secondary | ICD-10-CM

## 2022-03-17 DIAGNOSIS — M1712 Unilateral primary osteoarthritis, left knee: Secondary | ICD-10-CM | POA: Diagnosis not present

## 2022-03-17 DIAGNOSIS — J4530 Mild persistent asthma, uncomplicated: Secondary | ICD-10-CM

## 2022-03-17 DIAGNOSIS — Z636 Dependent relative needing care at home: Secondary | ICD-10-CM | POA: Diagnosis not present

## 2022-03-17 DIAGNOSIS — F419 Anxiety disorder, unspecified: Secondary | ICD-10-CM

## 2022-03-17 MED ORDER — SPIRONOLACTONE 25 MG PO TABS: 25.0000 mg | ORAL_TABLET | Freq: Every day | ORAL | 1 refills | Status: DC

## 2022-03-17 NOTE — Patient Instructions (Signed)
It was great to see you!  Drop the spironolactone to 25 mg Continue amlodopine 5 mg  Check your blood pressures 1-2 x a week and send me a mychart message in about a month so I know what your averages are  I will refill your pain medication Please follow-up in 6 months to have this refilled, sooner if concerns  Take care,  Inda Coke PA-C

## 2022-03-17 NOTE — Progress Notes (Signed)
Virginia Cabrera is a 67 y.o. female here for a follow up of a pre-existing problem.  History of Present Illness:   Chief Complaint  Patient presents with   Transfer of care    HPI  HTN Currently taking spironolactone 50 mg daily, losartan 100 mg daily, and amlodipine 5 mg. At home blood pressure readings are: not checked. Patient denies chest pain, SOB, blurred vision, dizziness, unusual headaches, lower leg swelling. Patient is compliant with medication. Denies excessive caffeine intake, stimulant usage, excessive alcohol intake, or increase in salt consumption.  BP Readings from Last 3 Encounters:  03/17/22 112/80  10/09/21 128/72  09/24/21 130/74   She felt better when she was taking 25 mg spironolactone daily. She would like to decrease back to this. She gets cramping in her legs and stomach and feels like its related to this.  OA Takes tramadol 50 mg twice daily. Takes prn hydrocodone-acetaminophen 5-325 mg. Takes for her R knee pain. Sees Dr. Onnie Graham at [facility] and planning for R knee surgery soon.  Anxiety/Caregiver Burden Takes lexapro 20 mg daily and klonopin 0.5 mg prn, she also takes 50 mg trazdone prn. This regimen is helpful for her.  Asthma Takes Flovent twice daily and does well with this Denies any new/worsening sx   Past Medical History:  Diagnosis Date   Allergy    Anal fissure    Anxiety    Arthritis    Asthma    Colon polyp, hyperplastic    Depression    GERD (gastroesophageal reflux disease)    Hypertension    IBS (irritable bowel syndrome)    Vitamin deficiency      Social History   Tobacco Use   Smoking status: Former    Packs/day: 0.20    Years: 15.00    Total pack years: 3.00    Types: Cigarettes    Start date: 08/24/1983    Quit date: 08/24/1998    Years since quitting: 23.5   Smokeless tobacco: Never  Vaping Use   Vaping Use: Never used  Substance Use Topics   Alcohol use: Yes    Comment: occssionally   Drug use: No     Past Surgical History:  Procedure Laterality Date   CESAREAN SECTION     TONSILLECTOMY      Family History  Problem Relation Age of Onset   Breast cancer Mother    Hypertension Mother    Melanoma Mother    Brain cancer Father    Hypertension Sister    Colon polyps Sister    Hypertension Sister    Diabetes Brother    Kidney disease Brother    Hypertension Brother    Post-traumatic stress disorder Brother        war   Emphysema Maternal Grandmother    Heart disease Maternal Grandfather    Breast cancer Maternal Aunt    Stroke Maternal Aunt    Heart attack Maternal Aunt    Leukemia Maternal Aunt    Colon cancer Other    Esophageal cancer Other    Stomach cancer Other    Pancreatic cancer Other     Allergies  Allergen Reactions   Morphine Other (See Comments)    Causes "coma"   Morphine And Related     Current Medications:   Current Outpatient Medications:    albuterol (VENTOLIN HFA) 108 (90 Base) MCG/ACT inhaler, INHALE 1 PUFF BY MOUTH EVERY 4 TO 6 HOURS AS NEEDED, Disp: 18 g, Rfl: 2   amLODipine (  NORVASC) 5 MG tablet, Take 1 tablet (5 mg total) by mouth daily., Disp: 90 tablet, Rfl: 3   baclofen (LIORESAL) 20 MG tablet, Take 1 tablet (20 mg total) by mouth 3 (three) times daily., Disp: 30 each, Rfl: 0   cholecalciferol (VITAMIN D3) 25 MCG (1000 UNIT) tablet, Take 1,000 Units by mouth daily., Disp: , Rfl:    clonazePAM (KLONOPIN) 0.5 MG tablet, Take 1 tablet by mouth twice daily as needed for anxiety, Disp: 60 tablet, Rfl: 0   escitalopram (LEXAPRO) 20 MG tablet, Take 1 tablet (20 mg total) by mouth at bedtime., Disp: 90 tablet, Rfl: 3   esomeprazole (NEXIUM) 40 MG capsule, Take 1 capsule (40 mg total) by mouth daily at 12 noon., Disp: 90 capsule, Rfl: 3   fluticasone (FLOVENT HFA) 220 MCG/ACT inhaler, Inhale 2 puffs into the lungs daily., Disp: 1 each, Rfl: 12   HYDROcodone-acetaminophen (NORCO/VICODIN) 5-325 MG tablet, Take 1 tablet by mouth every 6 (six) hours  as needed for moderate pain., Disp: 30 tablet, Rfl: 0   hydrOXYzine (ATARAX) 10 MG tablet, Take 1 tablet (10 mg total) by mouth 3 (three) times daily as needed., Disp: 90 tablet, Rfl: 11   losartan (COZAAR) 100 MG tablet, Take 1 tablet (100 mg total) by mouth daily., Disp: 90 tablet, Rfl: 3   meclizine (ANTIVERT) 25 MG tablet, Take 1 tablet (25 mg total) by mouth 3 (three) times daily as needed for dizziness., Disp: 30 tablet, Rfl: 0   spironolactone (ALDACTONE) 25 MG tablet, Take 1 tablet (25 mg total) by mouth daily., Disp: 90 tablet, Rfl: 1   traMADol (ULTRAM) 50 MG tablet, Take 1 tablet (50 mg total) by mouth every 12 (twelve) hours as needed., Disp: 180 tablet, Rfl: 0   traZODone (DESYREL) 100 MG tablet, TK 1 T PO HS PRN, Disp: , Rfl:    Review of Systems:   ROS Negative unless otherwise specified per HPI.   Vitals:   Vitals:   03/17/22 1409  BP: 112/80  Pulse: 73  Temp: 97.7 F (36.5 C)  TempSrc: Temporal  SpO2: 97%  Weight: 213 lb 4 oz (96.7 kg)  Height: 5\' 3"  (1.6 m)     Body mass index is 37.78 kg/m.  Physical Exam:   Physical Exam Vitals and nursing note reviewed.  Constitutional:      General: She is not in acute distress.    Appearance: She is well-developed. She is not ill-appearing or toxic-appearing.  Cardiovascular:     Rate and Rhythm: Normal rate and regular rhythm.     Pulses: Normal pulses.     Heart sounds: Normal heart sounds, S1 normal and S2 normal.  Pulmonary:     Effort: Pulmonary effort is normal.     Breath sounds: Normal breath sounds.  Skin:    General: Skin is warm and dry.  Neurological:     Mental Status: She is alert.     GCS: GCS eye subscore is 4. GCS verbal subscore is 5. GCS motor subscore is 6.  Psychiatric:        Speech: Speech normal.        Behavior: Behavior normal. Behavior is cooperative.     Assessment and Plan:   Acute anxiety; Caregiver burden Well controlled Continue lexapro 20 mg daily and klonopin 0.5 mg  prn, she also takes 50 mg trazdone prn. Follow-up in 6 months, sooner if concerns  Osteoarthritis of left knee, unspecified osteoarthritis type Well controlled PDMP reviewed -- no red flags  Continue tramadol 50 mg BID daily Continue hydrocodone-acetaminophen 5-325 mg as needed Pain contract signed today Follow-up in 3-6 months  Essential hypertension Well controlled Will decrease spironolactone to 25 mg daily Continue losartan 100 mg, amlodipine 5 mg Check BP and follow-up via mychart with averages in next 4-6 weeks and we can adjust amlodipine if needed  Mild persistent asthma without complication Well controlled Continue Flovent BID Follow-up in 6 mo, sooner if needed  Jarold Motto, PA-C

## 2022-03-18 LAB — LIPID PANEL
Cholesterol: 208 mg/dL — ABNORMAL HIGH (ref 0–200)
HDL: 53.3 mg/dL (ref >39.00)
NonHDL: 155
Total CHOL/HDL Ratio: 4
Triglycerides: 208 mg/dL — ABNORMAL HIGH (ref 0.0–149.0)
VLDL: 41.6 mg/dL — ABNORMAL HIGH (ref 0.0–40.0)

## 2022-03-18 LAB — CBC WITH DIFFERENTIAL/PLATELET
Basophils Absolute: 0.1 10*3/uL (ref 0.0–0.1)
Basophils Relative: 1.3 % (ref 0.0–3.0)
Eosinophils Absolute: 0.1 10*3/uL (ref 0.0–0.7)
Eosinophils Relative: 1.3 % (ref 0.0–5.0)
HCT: 40.4 % (ref 36.0–46.0)
Hemoglobin: 13.7 g/dL (ref 12.0–15.0)
Lymphocytes Relative: 28.5 % (ref 12.0–46.0)
Lymphs Abs: 2.6 10*3/uL (ref 0.7–4.0)
MCHC: 34 g/dL (ref 30.0–36.0)
MCV: 90 fl (ref 78.0–100.0)
Monocytes Absolute: 0.8 10*3/uL (ref 0.1–1.0)
Monocytes Relative: 8.8 % (ref 3.0–12.0)
Neutro Abs: 5.4 10*3/uL (ref 1.4–7.7)
Neutrophils Relative %: 60.1 % (ref 43.0–77.0)
Platelets: 245 10*3/uL (ref 150.0–400.0)
RBC: 4.49 Mil/uL (ref 3.87–5.11)
RDW: 12.9 % (ref 11.5–15.5)
WBC: 9 10*3/uL (ref 4.0–10.5)

## 2022-03-18 LAB — COMPREHENSIVE METABOLIC PANEL
ALT: 17 U/L (ref 0–35)
AST: 21 U/L (ref 0–37)
Albumin: 4.5 g/dL (ref 3.5–5.2)
Alkaline Phosphatase: 60 U/L (ref 39–117)
BUN: 17 mg/dL (ref 6–23)
CO2: 26 mEq/L (ref 19–32)
Calcium: 9.7 mg/dL (ref 8.4–10.5)
Chloride: 103 mEq/L (ref 96–112)
Creatinine, Ser: 1.17 mg/dL (ref 0.40–1.20)
GFR: 49.45 mL/min — ABNORMAL LOW (ref >60.00)
Glucose, Bld: 82 mg/dL (ref 70–99)
Potassium: 4.1 mEq/L (ref 3.5–5.1)
Sodium: 140 mEq/L (ref 135–145)
Total Bilirubin: 0.5 mg/dL (ref 0.2–1.2)
Total Protein: 7.5 g/dL (ref 6.0–8.3)

## 2022-03-18 LAB — LDL CHOLESTEROL, DIRECT: Direct LDL: 112 mg/dL

## 2022-05-14 LAB — HM MAMMOGRAPHY

## 2022-05-15 ENCOUNTER — Encounter: Payer: Self-pay | Admitting: Physician Assistant

## 2022-06-05 ENCOUNTER — Telehealth: Payer: Self-pay | Admitting: *Deleted

## 2022-06-05 ENCOUNTER — Other Ambulatory Visit: Payer: Self-pay | Admitting: Physician Assistant

## 2022-06-05 DIAGNOSIS — M1712 Unilateral primary osteoarthritis, left knee: Secondary | ICD-10-CM

## 2022-06-05 MED ORDER — TRAMADOL HCL 50 MG PO TABS: 50.0000 mg | ORAL_TABLET | Freq: Two times a day (BID) | ORAL | 0 refills | Status: DC | PRN

## 2022-06-05 NOTE — Telephone Encounter (Signed)
Noted  

## 2022-06-05 NOTE — Telephone Encounter (Signed)
Received fax from pharmacy requesting refill for Tramadol 50 mg BID. Last OV 03/17/2022.

## 2022-06-08 ENCOUNTER — Encounter: Payer: Self-pay | Admitting: Cardiovascular Disease

## 2022-06-08 NOTE — Progress Notes (Unsigned)
Cardiology Office Note:    Date:  06/09/2022   ID:  Virginia Cabrera, DOB 05-Oct-1955, MRN [id number]  PCP:  Inda Coke, PA  Cardiologist: Jahmad Petrich  Electrophysiologist:  None   Referring MD: Maximiano Coss, NP   Chief Complaint  Patient presents with   Hypertension         History of Present Illness:    Virginia Cabrera is a 67 y.o. female with a hx of HTN  Were are seeing her at the request of Maximiano Coss for further evaluation of a long QT interval  Hx of HTN and obesity  Also has knee and back issues .   In Oct. She had an episode of severe leg cramps at [facility]   She had pre-syncope . Cold, clammy , felt pale,   Went to ER , found to have very low potassium level .  Was having lots of PVCs   She saw her primary medical doctor on October 21. At that time her potassium was still 3.3. QT interval was 418 ms with a QTC of 457 ms. She remains a little fatigued but has not had any further episodes of this leg cramping or cold and clammy sensation or presyncope.  She has a knee injury that is going to require surgery at some point. She is not able to get much exercise.  No cp , no nausea ,  No hematuria , hematachezia, , no rash ,  Has DOE with any exercise - admits that her weight is likely an issue.  Watches her salt intake Eats lots of potatoes and chocolate   .  June 12, 2021: Virginia Cabrera is seen after 3 year absence BP is mildly elevaste  We discussed weight loss Has issues with her R knee,  needs R TKA  Also needs shoulder surgery   Will increase her spiro to 50 mg a day  Has some left sided chest discomfort,  lasts many seconds  Will get a lexiscan myoview    June 09, 2022 Virginia Cabrera is seen for follow up of her HTN No cardiac  issues,  having a breast bx next week .  Some exercise  Limited by knee pain   Was seen for long QT when her potassium was low.  She has done a nice job with keeping her levels within range   No cp    Past Medical  History:  Diagnosis Date   Allergy    Anal fissure    Anxiety    Arthritis    Asthma    Colon polyp, hyperplastic    Depression    GERD (gastroesophageal reflux disease)    Hypertension    IBS (irritable bowel syndrome)    Vitamin deficiency     Past Surgical History:  Procedure Laterality Date   CESAREAN SECTION     TONSILLECTOMY      Current Medications: Current Meds  Medication Sig   albuterol (VENTOLIN HFA) 108 (90 Base) MCG/ACT inhaler INHALE 1 PUFF BY MOUTH EVERY 4 TO 6 HOURS AS NEEDED   baclofen (LIORESAL) 20 MG tablet Take 1 tablet (20 mg total) by mouth 3 (three) times daily.   cholecalciferol (VITAMIN D3) 25 MCG (1000 UNIT) tablet Take 1,000 Units by mouth daily.   clonazePAM (KLONOPIN) 0.5 MG tablet Take 1 tablet by mouth twice daily as needed for anxiety   esomeprazole (NEXIUM) 40 MG capsule Take 1 capsule (40 mg total) by mouth daily at 12 noon.   fluticasone (FLOVENT HFA)  220 MCG/ACT inhaler Inhale 2 puffs into the lungs daily.   HYDROcodone-acetaminophen (NORCO/VICODIN) 5-325 MG tablet Take 1 tablet by mouth every 6 (six) hours as needed for moderate pain.   hydrOXYzine (ATARAX) 10 MG tablet Take 1 tablet (10 mg total) by mouth 3 (three) times daily as needed.   meclizine (ANTIVERT) 25 MG tablet Take 1 tablet (25 mg total) by mouth 3 (three) times daily as needed for dizziness.   traMADol (ULTRAM) 50 MG tablet Take 1 tablet (50 mg total) by mouth every 12 (twelve) hours as needed.   traZODone (DESYREL) 100 MG tablet TK 1 T PO HS PRN   [DISCONTINUED] amLODipine (NORVASC) 5 MG tablet Take 1 tablet (5 mg total) by mouth daily.   [DISCONTINUED] losartan (COZAAR) 100 MG tablet Take 1 tablet (100 mg total) by mouth daily.   [DISCONTINUED] spironolactone (ALDACTONE) 25 MG tablet Take 1 tablet (25 mg total) by mouth daily.     Allergies:   Morphine and Morphine and related   Social History   Socioeconomic History   Marital status: Single    Spouse name: Not on  file   Number of children: 1   Years of education: Not on file   Highest education level: 12th grade  Occupational History   Occupation: disabled/retired  Tobacco Use   Smoking status: Former    Packs/day: 0.20    Years: 15.00    Additional pack years: 0.00    Total pack years: 3.00    Types: Cigarettes    Start date: 08/24/1983    Quit date: 08/24/1998    Years since quitting: 23.8   Smokeless tobacco: Never  Vaping Use   Vaping Use: Never used  Substance and Sexual Activity   Alcohol use: Yes    Comment: occssionally   Drug use: No   Sexual activity: Not on file  Other Topics Concern   Not on file  Social History Narrative   Not on file   Social Determinants of Health   Financial Resource Strain: Low Risk  (08/24/2018)   Overall Financial Resource Strain (CARDIA)    Difficulty of Paying Living Expenses: Not hard at all  Food Insecurity: No Food Insecurity (08/24/2018)   Hunger Vital Sign    Worried About Running Out of Food in the Last Year: Never true    [place] in the Last Year: Never true  Transportation Needs: No Transportation Needs (08/24/2018)   PRAPARE - Hydrologist (Medical): No    Lack of Transportation (Non-Medical): No  Physical Activity: Inactive (08/24/2018)   Exercise Vital Sign    Days of Exercise per Week: 0 days    Minutes of Exercise per Session: 0 min  Stress: No Stress Concern Present (08/24/2018)   [place]    Feeling of Stress : Not at all  Social Connections: Moderately Isolated (08/24/2018)   Social Connection and Isolation Panel [NHANES]    Frequency of Communication with Friends and Family: Three times a week    Frequency of Social Gatherings with Friends and Family: Twice a week    Attends Religious Services: 1 to 4 times per year    Active Member of [employer] or Organizations: No    Attends Music therapist: Never    Marital  Status: Never married     Family History: The patient's family history includes Brain cancer in her father; Breast cancer in her maternal aunt  and mother; Colon cancer in an other family member; Colon polyps in her sister; Diabetes in her brother; Emphysema in her maternal grandmother; Esophageal cancer in an other family member; Heart attack in her maternal aunt; Heart disease in her maternal grandfather; Hypertension in her brother, mother, sister, and sister; Kidney disease in her brother; Leukemia in her maternal aunt; Melanoma in her mother; Pancreatic cancer in an other family member; Post-traumatic stress disorder in her brother; Stomach cancer in an other family member; Stroke in her maternal aunt.  ROS:   Please see the history of present illness.     All other systems reviewed and are negative.  EKGs/Labs/Other Studies Reviewed:    The following studies were reviewed today:   EKG:    June 09, 2022.  NSR ,  poor R wave progression .  Normal QT interval   Recent Labs: 06/19/2021: Magnesium 2.0; Pro B Natriuretic peptide (BNP) 11.0 09/24/2021: TSH 1.11 03/17/2022: ALT 17; BUN 17; Creatinine, Ser 1.17; Hemoglobin 13.7; Platelets 245.0; Potassium 4.1; Sodium 140  Recent Lipid Panel    Component Value Date/Time   CHOL 208 (H) 03/17/2022 1453   CHOL 204 (H) 07/05/2019 1229   TRIG 208.0 (H) 03/17/2022 1453   HDL 53.30 03/17/2022 1453   HDL 49 07/05/2019 1229   CHOLHDL 4 03/17/2022 1453   VLDL 41.6 (H) 03/17/2022 1453   LDLCALC 116 (H) 09/24/2021 1217   LDLCALC 113 (H) 07/05/2019 1229   LDLDIRECT 112.0 03/17/2022 1453    Physical Exam:    Physical Exam: Blood pressure 112/80, pulse 73, height 5' 3.5" (1.613 m), weight 213 lb 3.2 oz (96.7 kg), SpO2 98 %.       GEN:  Well nourished, well developed in no acute distress HEENT: Normal NECK: No JVD; No carotid bruits LYMPHATICS: No lymphadenopathy CARDIAC: RRR , no murmurs, rubs, gallops RESPIRATORY:  Clear to auscultation  without rales, wheezing or rhonchi  ABDOMEN: Soft, non-tender, non-distended MUSCULOSKELETAL:  No edema; No deformity  SKIN: Warm and dry NEUROLOGIC:  Alert and oriented x 3   ASSESSMENT:    1. Essential hypertension      PLAN:     Prolonged QT :   related to hypokalemia    2.  Dyspnea on exertion:  advised more exercise, weight loss   3.  Hypertension:   well controlled.   Medication Adjustments/Labs and Tests Ordered: Current medicines are reviewed at length with the patient today.  Concerns regarding medicines are outlined above.  Orders Placed This Encounter  Procedures   EKG 12-Lead   Meds ordered this encounter  Medications   amLODipine (NORVASC) 5 MG tablet    Sig: Take 1 tablet (5 mg total) by mouth daily.    Dispense:  90 tablet    Refill:  3   spironolactone (ALDACTONE) 25 MG tablet    Sig: Take 1 tablet (25 mg total) by mouth daily.    Dispense:  90 tablet    Refill:  3   losartan (COZAAR) 100 MG tablet    Sig: Take 1 tablet (100 mg total) by mouth daily.    Dispense:  90 tablet    Refill:  3     Patient Instructions  Medication Instructions:  REFILLED Spironolactone, Amlodipine, and Losartan *If you need a refill on your cardiac medications before your next appointment, please call your pharmacy*   Lab Work: NONE If you have labs (blood work) drawn today and your tests are completely normal, you will receive your results  only by: [employer] (if you have MyChart) OR A paper copy in the mail If you have any lab test that is abnormal or we need to change your treatment, we will call you to review the results.   Testing/Procedures: NONE   Follow-Up: At [facility], you and your health needs are our priority.  As part of our continuing mission to provide you with exceptional heart care, we have created designated Provider Care Teams.  These Care Teams include your primary Cardiologist (physician) and Advanced Practice Providers  (APPs -  Physician Assistants and Nurse Practitioners) who all work together to provide you with the care you need, when you need it.  We recommend signing up for the patient portal called "MyChart".  Sign up information is provided on this After Visit Summary.  MyChart is used to connect with patients for Virtual Visits (Telemedicine).  Patients are able to view lab/test results, encounter notes, upcoming appointments, etc.  Non-urgent messages can be sent to your provider as well.   To learn more about what you can do with MyChart, go to [email or web address].    Your next appointment:   1 year(s)  Provider:   Mertie Moores, MD        Signed, Mertie Moores, MD  06/09/2022 2:32 PM    [place]

## 2022-06-09 ENCOUNTER — Other Ambulatory Visit: Payer: Self-pay | Admitting: Physician Assistant

## 2022-06-09 ENCOUNTER — Ambulatory Visit: Payer: Medicare Other | Attending: Cardiovascular Disease | Admitting: Cardiovascular Disease

## 2022-06-09 ENCOUNTER — Encounter: Payer: Self-pay | Admitting: Cardiovascular Disease

## 2022-06-09 ENCOUNTER — Telehealth: Payer: Self-pay | Admitting: Physician Assistant

## 2022-06-09 DIAGNOSIS — M1712 Unilateral primary osteoarthritis, left knee: Secondary | ICD-10-CM

## 2022-06-09 DIAGNOSIS — R0602 Shortness of breath: Secondary | ICD-10-CM

## 2022-06-09 DIAGNOSIS — I10 Essential (primary) hypertension: Secondary | ICD-10-CM

## 2022-06-09 MED ORDER — QVAR REDIHALER 80 MCG/ACT IN AERB
2.0000 | INHALATION_SPRAY | Freq: Two times a day (BID) | RESPIRATORY_TRACT | 2 refills | Status: DC
Start: 2022-06-09 — End: 2022-10-07

## 2022-06-09 MED ORDER — LOSARTAN POTASSIUM 100 MG PO TABS
100.0000 mg | ORAL_TABLET | Freq: Every day | ORAL | 3 refills | Status: AC
Start: 2022-06-09 — End: ?

## 2022-06-09 MED ORDER — SPIRONOLACTONE 25 MG PO TABS
25.0000 mg | ORAL_TABLET | Freq: Every day | ORAL | 3 refills | Status: DC
Start: 2022-06-09 — End: 2023-06-24

## 2022-06-09 MED ORDER — HYDROCODONE-ACETAMINOPHEN 5-325 MG PO TABS
1.0000 | ORAL_TABLET | Freq: Four times a day (QID) | ORAL | 0 refills | Status: DC | PRN
Start: 2022-06-09 — End: 2022-12-30

## 2022-06-09 MED ORDER — AMLODIPINE BESYLATE 5 MG PO TABS
5.0000 mg | ORAL_TABLET | Freq: Every day | ORAL | 3 refills | Status: DC
Start: 2022-06-09 — End: 2023-06-24

## 2022-06-09 NOTE — Telephone Encounter (Signed)
   Encourage patient to contact the pharmacy for refills or they can request refills through [place]:  Please schedule appointment if longer than 1 year  NEXT APPOINTMENT DATE:  MEDICATION:  HYDROcodone-acetaminophen (NORCO/VICODIN) 5-325 MG tablet   fluticasone (FLOVENT HFA) 220 MCG/ACT inhaler   Is the patient out of medication? YES  PHARMACY:  [facility] ([facility]), [place] - [address] [id number], [place] ([place]) [place] [address]    Let patient know to contact pharmacy at the end of the day to make sure medication is ready.  Please notify patient to allow 48-72 hours to process

## 2022-06-09 NOTE — Patient Instructions (Signed)
Medication Instructions:  REFILLED Spironolactone, Amlodipine, and Losartan *If you need a refill on your cardiac medications before your next appointment, please call your pharmacy*   Lab Work: NONE If you have labs (blood work) drawn today and your tests are completely normal, you will receive your results only by: [place] (if you have MyChart) OR A paper copy in the mail If you have any lab test that is abnormal or we need to change your treatment, we will call you to review the results.   Testing/Procedures: NONE   Follow-Up: At [facility], you and your health needs are our priority.  As part of our continuing mission to provide you with exceptional heart care, we have created designated Provider Care Teams.  These Care Teams include your primary Cardiologist (physician) and Advanced Practice Providers (APPs -  Physician Assistants and Nurse Practitioners) who all work together to provide you with the care you need, when you need it.  We recommend signing up for the patient portal called "MyChart".  Sign up information is provided on this After Visit Summary.  MyChart is used to connect with patients for Virtual Visits (Telemedicine).  Patients are able to view lab/test results, encounter notes, upcoming appointments, etc.  Non-urgent messages can be sent to your provider as well.   To learn more about what you can do with MyChart, go to [email or web address].    Your next appointment:   1 year(s)  Provider:   Mertie Moores, MD

## 2022-06-09 NOTE — Telephone Encounter (Signed)
Spoke to pt told her Samantha sent her Rx for Hydrocodone to the pharmacy and we had to change your Flovent inhaler to Qvar inhaler due to insurance. Pt verbalized understanding.

## 2022-06-09 NOTE — Telephone Encounter (Signed)
Please see message, pt requesting refill for Hydrocodone 5-325 mg. Last filled by Maximiano Coss. Tramadol was just prescribed for her by you. Please advise.

## 2022-06-12 ENCOUNTER — Other Ambulatory Visit: Payer: Self-pay | Admitting: Physician Assistant

## 2022-06-12 DIAGNOSIS — F411 Generalized anxiety disorder: Secondary | ICD-10-CM

## 2022-06-12 MED ORDER — CLONAZEPAM 0.5 MG PO TABS
0.5000 mg | ORAL_TABLET | Freq: Two times a day (BID) | ORAL | 0 refills | Status: DC | PRN
Start: 2022-06-12 — End: 2023-08-10

## 2022-06-16 ENCOUNTER — Telehealth: Payer: Self-pay | Admitting: Physician Assistant

## 2022-06-16 NOTE — Telephone Encounter (Signed)
Copied from CRM [id number]. Topic: Medicare AWV >> Jun 16, 2022  1:12 PM Gwenith Spitz wrote: Reason for CRM: Called patient to schedule Medicare Annual Wellness Visit (AWV). No voicemail available to leave a message.  Last date of AWV: N/A  Please schedule an appointment at any time with Inetta Fermo, [facility]. Please schedule AWVS with Inetta Fermo, [facility].  If any questions, please contact me at [phone].  Thank you ,  Gabriel Cirri [facility] AWV TEAM Direct Dial [phone]

## 2022-06-18 ENCOUNTER — Other Ambulatory Visit: Payer: Self-pay | Admitting: Radiology

## 2022-07-15 ENCOUNTER — Encounter: Payer: Self-pay | Admitting: Physician Assistant

## 2022-09-02 ENCOUNTER — Ambulatory Visit: Payer: Medicare Other

## 2022-09-02 ENCOUNTER — Ambulatory Visit (HOSPITAL_COMMUNITY)
Admission: RE | Admit: 2022-09-02 | Discharge: 2022-09-02 | Disposition: A | Discharge status: Home / Self Care | Payer: Medicare Other | Source: Ambulatory Visit | Attending: Physician Assistant | Admitting: Physician Assistant

## 2022-09-02 ENCOUNTER — Ambulatory Visit (INDEPENDENT_AMBULATORY_CARE_PROVIDER_SITE_OTHER): Payer: Medicare Other | Admitting: Physician Assistant

## 2022-09-02 ENCOUNTER — Ambulatory Visit (INDEPENDENT_AMBULATORY_CARE_PROVIDER_SITE_OTHER): Payer: Medicare Other

## 2022-09-02 ENCOUNTER — Encounter: Payer: Self-pay | Admitting: Physician Assistant

## 2022-09-02 ENCOUNTER — Other Ambulatory Visit: Payer: Self-pay | Admitting: Physician Assistant

## 2022-09-02 VITALS — BP 124/80 | HR 76 | Temp 97.3°F | Ht 63.5 in | Wt 216.2 lb

## 2022-09-02 VITALS — Wt 213.0 lb

## 2022-09-02 DIAGNOSIS — E559 Vitamin D deficiency, unspecified: Secondary | ICD-10-CM | POA: Diagnosis not present

## 2022-09-02 DIAGNOSIS — M79604 Pain in right leg: Secondary | ICD-10-CM | POA: Diagnosis present

## 2022-09-02 DIAGNOSIS — R1084 Generalized abdominal pain: Secondary | ICD-10-CM | POA: Diagnosis not present

## 2022-09-02 DIAGNOSIS — Z1211 Encounter for screening for malignant neoplasm of colon: Secondary | ICD-10-CM

## 2022-09-02 DIAGNOSIS — Z800 Family history of malignant neoplasm of digestive organs: Secondary | ICD-10-CM

## 2022-09-02 DIAGNOSIS — Z0000 Encounter for general adult medical examination without abnormal findings: Secondary | ICD-10-CM

## 2022-09-02 DIAGNOSIS — M1712 Unilateral primary osteoarthritis, left knee: Secondary | ICD-10-CM

## 2022-09-02 LAB — CBC WITH DIFFERENTIAL/PLATELET
Basophils Absolute: 0 10*3/uL (ref 0.0–0.1)
Basophils Relative: 0.4 % (ref 0.0–3.0)
Eosinophils Absolute: 0.3 10*3/uL (ref 0.0–0.7)
Eosinophils Relative: 4.5 % (ref 0.0–5.0)
HCT: 40.6 % (ref 36.0–46.0)
Hemoglobin: 13.3 g/dL (ref 12.0–15.0)
Lymphocytes Relative: 28.9 % (ref 12.0–46.0)
Lymphs Abs: 2.1 10*3/uL (ref 0.7–4.0)
MCHC: 32.7 g/dL (ref 30.0–36.0)
MCV: 91.2 fl (ref 78.0–100.0)
Monocytes Absolute: 0.6 10*3/uL (ref 0.1–1.0)
Monocytes Relative: 7.8 % (ref 3.0–12.0)
Neutro Abs: 4.4 10*3/uL (ref 1.4–7.7)
Neutrophils Relative %: 58.4 % (ref 43.0–77.0)
Platelets: 229 10*3/uL (ref 150.0–400.0)
RBC: 4.45 Mil/uL (ref 3.87–5.11)
RDW: 13.2 % (ref 11.5–15.5)
WBC: 7.4 10*3/uL (ref 4.0–10.5)

## 2022-09-02 LAB — COMPREHENSIVE METABOLIC PANEL
ALT: 25 U/L (ref 0–35)
AST: 26 U/L (ref 0–37)
Albumin: 4.4 g/dL (ref 3.5–5.2)
Alkaline Phosphatase: 53 U/L (ref 39–117)
BUN: 21 mg/dL (ref 6–23)
CO2: 25 mEq/L (ref 19–32)
Calcium: 9.6 mg/dL (ref 8.4–10.5)
Chloride: 104 mEq/L (ref 96–112)
Creatinine, Ser: 1.2 mg/dL (ref 0.40–1.20)
GFR: 47.82 mL/min — ABNORMAL LOW (ref >60.00)
Glucose, Bld: 85 mg/dL (ref 70–99)
Potassium: 4 mEq/L (ref 3.5–5.1)
Sodium: 139 mEq/L (ref 135–145)
Total Bilirubin: 0.5 mg/dL (ref 0.2–1.2)
Total Protein: 7.4 g/dL (ref 6.0–8.3)

## 2022-09-02 LAB — TSH: TSH: 0.82 u[IU]/mL (ref 0.35–5.50)

## 2022-09-02 LAB — VITAMIN D 25 HYDROXY (VIT D DEFICIENCY, FRACTURES): VITD: 20.69 ng/mL — ABNORMAL LOW (ref 30.00–100.00)

## 2022-09-02 MED ORDER — VITAMIN D (ERGOCALCIFEROL) 1.25 MG (50000 UNIT) PO CAPS: 50000.0000 [IU] | ORAL_CAPSULE | ORAL | 0 refills | Status: DC

## 2022-09-02 MED ORDER — TRAMADOL HCL 50 MG PO TABS
50.0000 mg | ORAL_TABLET | Freq: Two times a day (BID) | ORAL | 0 refills | Status: DC | PRN
Start: 2022-09-02 — End: 2022-09-23

## 2022-09-02 NOTE — Progress Notes (Signed)
Subjective:   Virginia Cabrera is a 67 y.o. female who presents for an Initial Medicare Annual Wellness Visit.  Visit Complete: Virtual  I connected with  Virginia Cabrera on 09/02/22 by a audio enabled telemedicine application and verified that I am speaking with the correct person using two identifiers.  Patient Location: Home  Provider Location: Office/Clinic  I discussed the limitations of evaluation and management by telemedicine. The patient expressed understanding and agreed to proceed.  Review of Systems     Cardiac Risk Factors include: hypertension;obesity (BMI >30kg/m2)     Objective:    Today's Vitals   09/02/22 0902  Weight: 213 lb (96.6 kg)   Body mass index is 37.14 kg/m.     09/02/2022    9:11 AM  Advanced Directives  Does Patient Have a Medical Advance Directive? No  Would patient like information on creating a medical advance directive? No - Patient declined    Current Medications (verified) Outpatient Encounter Medications as of 09/02/2022  Medication Sig   albuterol (VENTOLIN HFA) 108 (90 Base) MCG/ACT inhaler INHALE 1 PUFF BY MOUTH EVERY 4 TO 6 HOURS AS NEEDED   amLODipine (NORVASC) 5 MG tablet Take 1 tablet (5 mg total) by mouth daily.   baclofen (LIORESAL) 20 MG tablet Take 1 tablet (20 mg total) by mouth 3 (three) times daily.   beclomethasone (QVAR REDIHALER) 80 MCG/ACT inhaler Inhale 2 puffs into the lungs 2 (two) times daily.   cholecalciferol (VITAMIN D3) 25 MCG (1000 UNIT) tablet Take 1,000 Units by mouth daily.   clonazePAM (KLONOPIN) 0.5 MG tablet Take 1 tablet (0.5 mg total) by mouth 2 (two) times daily as needed. for anxiety   esomeprazole (NEXIUM) 40 MG capsule Take 1 capsule (40 mg total) by mouth daily at 12 noon.   HYDROcodone-acetaminophen (NORCO/VICODIN) 5-325 MG tablet Take 1 tablet by mouth every 6 (six) hours as needed for moderate pain.   hydrOXYzine (ATARAX) 10 MG tablet Take 1 tablet (10 mg total) by mouth 3 (three) times  daily as needed.   losartan (COZAAR) 100 MG tablet Take 1 tablet (100 mg total) by mouth daily.   meclizine (ANTIVERT) 25 MG tablet Take 1 tablet (25 mg total) by mouth 3 (three) times daily as needed for dizziness.   spironolactone (ALDACTONE) 25 MG tablet Take 1 tablet (25 mg total) by mouth daily.   traMADol (ULTRAM) 50 MG tablet Take 1 tablet (50 mg total) by mouth every 12 (twelve) hours as needed.   traZODone (DESYREL) 100 MG tablet TK 1 T PO HS PRN   No facility-administered encounter medications on file as of 09/02/2022.    Allergies (verified) Morphine and Morphine and codeine   History: Past Medical History:  Diagnosis Date   Allergy    Anal fissure    Anxiety    Arthritis    Asthma    Colon polyp, hyperplastic    Depression    GERD (gastroesophageal reflux disease)    Hypertension    IBS (irritable bowel syndrome)    Vitamin deficiency    Past Surgical History:  Procedure Laterality Date   CESAREAN SECTION     TONSILLECTOMY     Family History  Problem Relation Age of Onset   Breast cancer Mother    Hypertension Mother    Melanoma Mother    Brain cancer Father    Hypertension Sister    Colon polyps Sister    Hypertension Sister    Diabetes Brother  Kidney disease Brother    Hypertension Brother    Post-traumatic stress disorder Brother        war   Emphysema Maternal Grandmother    Heart disease Maternal Grandfather    Breast cancer Maternal Aunt    Stroke Maternal Aunt    Heart attack Maternal Aunt    Leukemia Maternal Aunt    Colon cancer Other    Esophageal cancer Other    Stomach cancer Other    Pancreatic cancer Other    Social History   Socioeconomic History   Marital status: Single    Spouse name: Not on file   Number of children: 1   Years of education: Not on file   Highest education level: 12th grade  Occupational History   Occupation: disabled/retired  Tobacco Use   Smoking status: Former    Packs/day: 0.20    Years: 15.00     Additional pack years: 0.00    Total pack years: 3.00    Types: Cigarettes    Start date: 08/24/1983    Quit date: 08/24/1998    Years since quitting: 24.0   Smokeless tobacco: Never  Vaping Use   Vaping Use: Never used  Substance and Sexual Activity   Alcohol use: Yes    Comment: occssionally   Drug use: No   Sexual activity: Not on file  Other Topics Concern   Not on file  Social History Narrative   Not on file   Social Determinants of Health   Financial Resource Strain: Low Risk  (09/02/2022)   Overall Financial Resource Strain (CARDIA)    Difficulty of Paying Living Expenses: Not hard at all  Food Insecurity: Food Insecurity Present (09/02/2022)   Hunger Vital Sign    Worried About Running Out of Food in the Last Year: Sometimes true    Ran Out of Food in the Last Year: Sometimes true  Transportation Needs: No Transportation Needs (09/02/2022)   PRAPARE - Administrator, Civil Service (Medical): No    Lack of Transportation (Non-Medical): No  Physical Activity: Inactive (09/02/2022)   Exercise Vital Sign    Days of Exercise per Week: 0 days    Minutes of Exercise per Session: 0 min  Stress: Stress Concern Present (09/02/2022)   [employer] of Occupational Health - Occupational Stress Questionnaire    Feeling of Stress : To some extent  Social Connections: Socially Isolated (09/02/2022)   Social Connection and Isolation Panel [NHANES]    Frequency of Communication with Friends and Family: More than three times a week    Frequency of Social Gatherings with Friends and Family: More than three times a week    Attends Religious Services: Never    Database administrator or Organizations: No    Attends Engineer, structural: Never    Marital Status: Never married    Tobacco Counseling Counseling given: Not Answered   Clinical Intake:  Pre-visit preparation completed: Yes  Pain : No/denies pain     BMI - recorded: 37.14 Nutritional  Status: BMI > 30  Obese Diabetes: No  How often do you need to have someone help you when you read instructions, pamphlets, or other written materials from your doctor or pharmacy?: 1 - Never  Interpreter Needed?: No  Information entered by :: Lanier Ensign, LPN   Activities of Daily Living    09/02/2022    9:12 AM  In your present state of health, do you have any difficulty performing the  following activities:  Hearing? 0  Vision? 0  Difficulty concentrating or making decisions? 0  Walking or climbing stairs? 1  Comment leg pain  Dressing or bathing? 0  Doing errands, shopping? 0  Preparing Food and eating ? N  Using the Toilet? N  In the past six months, have you accidently leaked urine? N  Do you have problems with loss of bowel control? N  Managing your Medications? N  Managing your Finances? N  Housekeeping or managing your Housekeeping? N    Patient Care Team: Jarold Motto, [place] as PCP - General (Physician Assistant) Nahser, Deloris Ping, MD as PCP - Cardiology (Cardiology)  Indicate any recent Medical Services you may have received from other than [facility] providers in the past year (date may be approximate).     Assessment:   This is a routine wellness examination for [place].  Hearing/Vision screen Hearing Screening - Comments:: Pt denies any hearing issues  Vision Screening - Comments:: [facility]   Dietary issues and exercise activities discussed:     Goals Addressed               This Visit's Progress     Patient Stated (pt-stated)        Lose weight        Depression Screen    09/02/2022    9:07 AM 10/09/2021   12:10 PM 09/24/2021   10:55 AM 07/16/2021    1:11 PM 06/19/2021    9:16 AM 04/02/2021    9:41 AM 01/04/2021    2:43 PM  PHQ 2/9 Scores  PHQ - 2 Score 1 0 2 0 2 4 3   PHQ- 9 Score  0 11 0 8 13 10     Fall Risk    09/02/2022    9:12 AM 10/09/2021   12:03 PM 09/24/2021   10:55 AM 06/19/2021     9:16 AM 04/02/2021    9:40 AM  Fall Risk   Falls in the past year? 1 0 0 0 0  Number falls in past yr: 1 0 0 0 0  Injury with Fall? 0 0 0 0 0  Risk for fall due to : Impaired balance/gait;Impaired vision No Fall Risks No Fall Risks No Fall Risks No Fall Risks  Follow up Falls prevention discussed Falls evaluation completed Falls evaluation completed Falls evaluation completed Falls evaluation completed    MEDICARE RISK AT HOME:  Medicare Risk at Home - 09/02/22 0914     Any stairs in or around the home? Yes    If so, are there any without handrails? No    Home free of loose throw rugs in walkways, pet beds, electrical cords, etc? Yes    Adequate lighting in your home to reduce risk of falls? Yes    Life alert? No    Use of a cane, walker or w/c? No    Grab bars in the bathroom? Yes    Shower chair or bench in shower? No    Elevated toilet seat or a handicapped toilet? No             TIMED UP AND GO:  Was the test performed? No    Cognitive Function:        09/02/2022    9:14 AM  6CIT Screen  What Year? 0 points  What month? 0 points  What time? 0 points  Count back from 20 0 points  Months in  reverse 0 points  Repeat phrase 0 points  Total Score 0 points    Immunizations  There is no immunization history on file for this patient.  TDAP status: Due, Education has been provided regarding the importance of this vaccine. Advised may receive this vaccine at local pharmacy or Health Dept. Aware to provide a copy of the vaccination record if obtained from local pharmacy or Health Dept. Verbalized acceptance and understanding.  Flu Vaccine status: Declined, Education has been provided regarding the importance of this vaccine but patient still declined. Advised may receive this vaccine at local pharmacy or Health Dept. Aware to provide a copy of the vaccination record if obtained from local pharmacy or Health Dept. Verbalized acceptance and understanding.  Pneumococcal  vaccine status: Declined,  Education has been provided regarding the importance of this vaccine but patient still declined. Advised may receive this vaccine at local pharmacy or Health Dept. Aware to provide a copy of the vaccination record if obtained from local pharmacy or Health Dept. Verbalized acceptance and understanding.   Covid-19 vaccine status: Declined, Education has been provided regarding the importance of this vaccine but patient still declined. Advised may receive this vaccine at local pharmacy or Health Dept.or vaccine clinic. Aware to provide a copy of the vaccination record if obtained from local pharmacy or Health Dept. Verbalized acceptance and understanding.  Qualifies for Shingles Vaccine? Yes   Zostavax completed No   Shingrix Completed?: No.    Education has been provided regarding the importance of this vaccine. Patient has been advised to call insurance company to determine out of pocket expense if they have not yet received this vaccine. Advised may also receive vaccine at local pharmacy or Health Dept. Verbalized acceptance and understanding.  Screening Tests Health Maintenance  Topic Date Due   DTaP/Tdap/Td (1 - Tdap) Never done   Colonoscopy  Never done   Zoster Vaccines- Shingrix (1 of 2) Never done   PAP SMEAR-Modifier  10/11/2021   COVID-19 Vaccine (1) 09/18/2022 (Originally 08/06/1956)   INFLUENZA VACCINE  10/09/2022   Medicare Annual Wellness (AWV)  09/02/2023   MAMMOGRAM  05/13/2024   Hepatitis C Screening  Completed   HIV Screening  Completed   HPV VACCINES  Aged Out    Health Maintenance  Health Maintenance Due  Topic Date Due   DTaP/Tdap/Td (1 - Tdap) Never done   Colonoscopy  Never done   Zoster Vaccines- Shingrix (1 of 2) Never done   PAP SMEAR-Modifier  10/11/2021    Colorectal cancer screening: Referral to GI placed 09/02/22. Pt aware the office will call re: appt.  Mammogram status: Completed 05/14/22. Repeat every year     Additional  Screening:  Hepatitis C Screening:  Completed 07/05/19  Vision Screening: Recommended annual ophthalmology exams for early detection of glaucoma and other disorders of the eye. Is the patient up to date with their annual eye exam?  Yes  Who is the provider or what [facility] the [facility]  If pt is not established with a provider, would they like to be referred to a provider to establish care? No .   Dental Screening: Recommended annual dental exams for proper oral hygiene   Community Resource Referral / Chronic Care Management: CRR required this visit?  No   CCM required this visit?  No     Plan:     I have personally reviewed and noted the following in the patient's  chart:   Medical and social history Use of alcohol, tobacco or illicit drugs  Current medications and supplements including opioid prescriptions. Patient is currently taking opioid prescriptions. Information provided to patient regarding non-opioid alternatives. Patient advised to discuss non-opioid treatment plan with their provider. Functional ability and status Nutritional status Physical activity Advanced directives List of other physicians Hospitalizations, surgeries, and ER visits in previous 12 months Vitals Screenings to include cognitive, depression, and falls Referrals and appointments  In addition, I have reviewed and discussed with patient certain preventive protocols, quality metrics, and best practice recommendations. A written personalized care plan for preventive services as well as general preventive health recommendations were provided to patient.     Marzella Schlein, LPN   1/61/0960   After Visit Summary: (MyChart) Due to this being a telephonic visit, the after visit summary with patients personalized plan was offered to patient via MyChart   Nurse Notes: none

## 2022-09-02 NOTE — Progress Notes (Signed)
Virginia Cabrera is a 67 y.o. female here for a new problem.  History of Present Illness:   Chief Complaint  Patient presents with   Abdominal Pain    Pt c/o abdominal pains were sever for 2 weeks, now off and on.    leg cramps    Pt c/o leg cramps right upper thigh last week lasted about 20 minutes still resolved, then happened again but not as severe.    HPI  Abdominal pain:  She complains of intermittent lower abdominal pain for about 2 weeks.  States the pain was lingering for about a week and has since improved.  She describes the pain as being similar to pain from constipation, she states that she has history of constipation. She is not sure if she is constipated because she has been so busy caring for her ex husband that she is not really doing much self care. She has family related stress factors and is unsure if this could also be associated. Denies: rectal bleeding, vomiting, diarrhea, gastroesophageal reflux disease  Leg cramps: She complains of leg cramps from her right upper thigh up to her hip, last week.  She reports severe pain and was unable to walk at the time. She took hydrocodone and symptoms resolved after 20 minutes.  Later that night, the cramping temporarily returned. Pain was not as severe as the first episode.  Currently she endorses tenderness to touch and pain when sitting down.  No prior history of blood clot.  Osteoarthritis Needs refill on tramadol Doing well with this overall, denies concerns Denies falls or driving while taking medication  Past Medical History:  Diagnosis Date   Allergy    Anal fissure    Anxiety    Arthritis    Asthma    Colon polyp, hyperplastic    Depression    GERD (gastroesophageal reflux disease)    Hypertension    IBS (irritable bowel syndrome)    Vitamin deficiency      Social History   Tobacco Use   Smoking status: Former    Packs/day: 0.20    Years: 15.00    Additional pack years: 0.00    Total pack  years: 3.00    Types: Cigarettes    Start date: 08/24/1983    Quit date: 08/24/1998    Years since quitting: 24.0   Smokeless tobacco: Never  Vaping Use   Vaping Use: Never used  Substance Use Topics   Alcohol use: Yes    Comment: occssionally   Drug use: No    Past Surgical History:  Procedure Laterality Date   CESAREAN SECTION     TONSILLECTOMY      Family History  Problem Relation Age of Onset   Breast cancer Mother    Hypertension Mother    Melanoma Mother    Brain cancer Father    Hypertension Sister    Colon polyps Sister    Hypertension Sister    Diabetes Brother    Kidney disease Brother    Hypertension Brother    Post-traumatic stress disorder Brother        war   Emphysema Maternal Grandmother    Heart disease Maternal Grandfather    Breast cancer Maternal Aunt    Stroke Maternal Aunt    Heart attack Maternal Aunt    Leukemia Maternal Aunt    Colon cancer Other    Esophageal cancer Other    Stomach cancer Other    Pancreatic cancer Other  Allergies  Allergen Reactions   Morphine Other (See Comments)    Causes "coma"   Morphine And Codeine     Current Medications:   Current Outpatient Medications:    albuterol (VENTOLIN HFA) 108 (90 Base) MCG/ACT inhaler, INHALE 1 PUFF BY MOUTH EVERY 4 TO 6 HOURS AS NEEDED, Disp: 18 g, Rfl: 2   amLODipine (NORVASC) 5 MG tablet, Take 1 tablet (5 mg total) by mouth daily., Disp: 90 tablet, Rfl: 3   baclofen (LIORESAL) 20 MG tablet, Take 1 tablet (20 mg total) by mouth 3 (three) times daily., Disp: 30 each, Rfl: 0   beclomethasone (QVAR REDIHALER) 80 MCG/ACT inhaler, Inhale 2 puffs into the lungs 2 (two) times daily., Disp: 1 each, Rfl: 2   cholecalciferol (VITAMIN D3) 25 MCG (1000 UNIT) tablet, Take 1,000 Units by mouth daily., Disp: , Rfl:    clonazePAM (KLONOPIN) 0.5 MG tablet, Take 1 tablet (0.5 mg total) by mouth 2 (two) times daily as needed. for anxiety, Disp: 60 tablet, Rfl: 0   esomeprazole (NEXIUM) 40 MG  capsule, Take 1 capsule (40 mg total) by mouth daily at 12 noon., Disp: 90 capsule, Rfl: 3   HYDROcodone-acetaminophen (NORCO/VICODIN) 5-325 MG tablet, Take 1 tablet by mouth every 6 (six) hours as needed for moderate pain., Disp: 30 tablet, Rfl: 0   hydrOXYzine (ATARAX) 10 MG tablet, Take 1 tablet (10 mg total) by mouth 3 (three) times daily as needed., Disp: 90 tablet, Rfl: 11   losartan (COZAAR) 100 MG tablet, Take 1 tablet (100 mg total) by mouth daily., Disp: 90 tablet, Rfl: 3   meclizine (ANTIVERT) 25 MG tablet, Take 1 tablet (25 mg total) by mouth 3 (three) times daily as needed for dizziness., Disp: 30 tablet, Rfl: 0   spironolactone (ALDACTONE) 25 MG tablet, Take 1 tablet (25 mg total) by mouth daily., Disp: 90 tablet, Rfl: 3   traMADol (ULTRAM) 50 MG tablet, Take 1 tablet (50 mg total) by mouth every 12 (twelve) hours as needed., Disp: 180 tablet, Rfl: 0   traZODone (DESYREL) 100 MG tablet, TK 1 T PO HS PRN, Disp: , Rfl:    Review of Systems:   Review of Systems  Gastrointestinal:  Positive for abdominal pain (lower).  Musculoskeletal:  Positive for myalgias (right thigh cramps, 2 episodes -- see HPI).    Vitals:   Vitals:   09/02/22 1052  BP: 124/80  Pulse: 76  Temp: (!) 97.3 F (36.3 C)  TempSrc: Temporal  SpO2: 99%  Weight: 216 lb 4 oz (98.1 kg)  Height: 5' 3.5" (1.613 m)     Body mass index is 37.71 kg/m.  Physical Exam:   Physical Exam Vitals and nursing note reviewed.  Constitutional:      General: She is not in acute distress.    Appearance: She is well-developed. She is not ill-appearing or toxic-appearing.  Cardiovascular:     Rate and Rhythm: Normal rate and regular rhythm.     Pulses: Normal pulses.     Heart sounds: Normal heart sounds, S1 normal and S2 normal.  Pulmonary:     Effort: Pulmonary effort is normal.     Breath sounds: Normal breath sounds.  Abdominal:     General: Abdomen is flat. Bowel sounds are normal.     Palpations: Abdomen is  soft.     Tenderness: There is no abdominal tenderness. There is no right CVA tenderness, left CVA tenderness, guarding or rebound.  Musculoskeletal:     Comments: Mild tenderness to  palpation to right inner thigh - no obvious swelling/erythema  No calf tenderness/swelling/erythema b/l   Skin:    General: Skin is warm and dry.  Neurological:     Mental Status: She is alert.     GCS: GCS eye subscore is 4. GCS verbal subscore is 5. GCS motor subscore is 6.  Psychiatric:        Speech: Speech normal.        Behavior: Behavior normal. Behavior is cooperative.     Assessment and Plan:   Generalized abdominal pain Almost resolved Recommend working on better eating, hydration and bowel habits Start miralax daily If any new/worsening symptom(s), recommend reaching out and we will advise further - consider CT abdomen/pel  We also have sent referral to get colonoscopy scheduled as she is overdue for this  Right leg pain Unclear etiology Will obtain ultrasound to rule out DVT due to acuity of pain Also update blood work per patient request to further evaluate Consider physical therapy vs sports medicine if persists and no cause found  Osteoarthritis of left knee, unspecified osteoarthritis type Refill tramadol  Prescription drug monitoring program reviewed - no red flags  Vitamin D deficiency Update Vitamin D and provide recommendations accordingly  I,Rachel Rivera,acting as a scribe for Jarold Motto, PA.,have documented all relevant documentation on the behalf of Jarold Motto, PA,as directed by  Jarold Motto, PA while in the presence of Jarold Motto, [place].  I, Jarold Motto, [place], have reviewed all documentation for this visit. The documentation on 09/02/22 for the exam, diagnosis, procedures, and orders are all accurate and complete.  Jarold Motto, PA-C

## 2022-09-02 NOTE — Patient Instructions (Signed)
Virginia Cabrera , Thank you for taking time to come for your Medicare Wellness Visit. I appreciate your ongoing commitment to your health goals. Please review the following plan we discussed and let me know if I can assist you in the future.   These are the goals we discussed:  Goals   None     This is a list of the screening recommended for you and due dates:  Health Maintenance  Topic Date Due   COVID-19 Vaccine (1) Never done   DTaP/Tdap/Td vaccine (1 - Tdap) Never done   Colon Cancer Screening  Never done   Zoster (Shingles) Vaccine (1 of 2) Never done   Pap Smear  10/11/2021   Flu Shot  10/09/2022   Medicare Annual Wellness Visit  09/02/2023   Mammogram  05/13/2024   Hepatitis C Screening  Completed   HIV Screening  Completed   HPV Vaccine  Aged Out    Advanced directives: Advance directive discussed with you today. Even though you declined this today please call our office should you change your mind and we can give you the proper paperwork for you to fill out.  Conditions/risks identified: lose weight   Next appointment: Follow up in one year for your annual wellness visit.   Preventive Care 40-64 Years, Female Preventive care refers to lifestyle choices and visits with your health care provider that can promote health and wellness. What does preventive care include? A yearly physical exam. This is also called an annual well check. Dental exams once or twice a year. Routine eye exams. Ask your health care provider how often you should have your eyes checked. Personal lifestyle choices, including: Daily care of your teeth and gums. Regular physical activity. Eating a healthy diet. Avoiding tobacco and drug use. Limiting alcohol use. Practicing safe sex. Taking low-dose aspirin daily starting at age 32. Taking vitamin and mineral supplements as recommended by your health care provider. What happens during an annual well check? The services and screenings done by your  health care provider during your annual well check will depend on your age, overall health, lifestyle risk factors, and family history of disease. Counseling  Your health care provider may ask you questions about your: Alcohol use. Tobacco use. Drug use. Emotional well-being. Home and relationship well-being. Sexual activity. Eating habits. Work and work Astronomer. Method of birth control. Menstrual cycle. Pregnancy history. Screening  You may have the following tests or measurements: Height, weight, and BMI. Blood pressure. Lipid and cholesterol levels. These may be checked every 5 years, or more frequently if you are over 77 years old. Skin check. Lung cancer screening. You may have this screening every year starting at age 68 if you have a 30-pack-year history of smoking and currently smoke or have quit within the past 15 years. Fecal occult blood test (FOBT) of the stool. You may have this test every year starting at age 40. Flexible sigmoidoscopy or colonoscopy. You may have a sigmoidoscopy every 5 years or a colonoscopy every 10 years starting at age 70. Hepatitis C blood test. Hepatitis B blood test. Sexually transmitted disease (STD) testing. Diabetes screening. This is done by checking your blood sugar (glucose) after you have not eaten for a while (fasting). You may have this done every 1-3 years. Mammogram. This may be done every 1-2 years. Talk to your health care provider about when you should start having regular mammograms. This may depend on whether you have a family history of breast cancer. BRCA-related  cancer screening. This may be done if you have a family history of breast, ovarian, tubal, or peritoneal cancers. Pelvic exam and Pap test. This may be done every 3 years starting at age 21. Starting at age 63, this may be done every 5 years if you have a Pap test in combination with an HPV test. Bone density scan. This is done to screen for osteoporosis. You may have  this scan if you are at high risk for osteoporosis. Discuss your test results, treatment options, and if necessary, the need for more tests with your health care provider. Vaccines  Your health care provider may recommend certain vaccines, such as: Influenza vaccine. This is recommended every year. Tetanus, diphtheria, and acellular pertussis (Tdap, Td) vaccine. You may need a Td booster every 10 years. Zoster vaccine. You may need this after age 19. Pneumococcal 13-valent conjugate (PCV13) vaccine. You may need this if you have certain conditions and were not previously vaccinated. Pneumococcal polysaccharide (PPSV23) vaccine. You may need one or two doses if you smoke cigarettes or if you have certain conditions. Talk to your health care provider about which screenings and vaccines you need and how often you need them. This information is not intended to replace advice given to you by your health care provider. Make sure you discuss any questions you have with your health care provider. Document Released: 03/23/2015 Document Revised: 11/14/2015 Document Reviewed: 12/26/2014 Elsevier Interactive Patient Education  2017 [employer].    Fall Prevention in the Home Falls can cause injuries. They can happen to people of all ages. There are many things you can do to make your home safe and to help prevent falls. What can I do on the outside of my home? Regularly fix the edges of walkways and driveways and fix any cracks. Remove anything that might make you trip as you walk through a door, such as a raised step or threshold. Trim any bushes or trees on the path to your home. Use bright outdoor lighting. Clear any walking paths of anything that might make someone trip, such as rocks or tools. Regularly check to see if handrails are loose or broken. Make sure that both sides of any steps have handrails. Any raised decks and porches should have guardrails on the edges. Have any leaves, snow, or  ice cleared regularly. Use sand or salt on walking paths during winter. Clean up any spills in your garage right away. This includes oil or grease spills. What can I do in the bathroom? Use night lights. Install grab bars by the toilet and in the tub and shower. Do not use towel bars as grab bars. Use non-skid mats or decals in the tub or shower. If you need to sit down in the shower, use a plastic, non-slip stool. Keep the floor dry. Clean up any water that spills on the floor as soon as it happens. Remove soap buildup in the tub or shower regularly. Attach bath mats securely with double-sided non-slip rug tape. Do not have throw rugs and other things on the floor that can make you trip. What can I do in the bedroom? Use night lights. Make sure that you have a light by your bed that is easy to reach. Do not use any sheets or blankets that are too big for your bed. They should not hang down onto the floor. Have a firm chair that has side arms. You can use this for support while you get dressed. Do not have throw  rugs and other things on the floor that can make you trip. What can I do in the kitchen? Clean up any spills right away. Avoid walking on wet floors. Keep items that you use a lot in easy-to-reach places. If you need to reach something above you, use a strong step stool that has a grab bar. Keep electrical cords out of the way. Do not use floor polish or wax that makes floors slippery. If you must use wax, use non-skid floor wax. Do not have throw rugs and other things on the floor that can make you trip. What can I do with my stairs? Do not leave any items on the stairs. Make sure that there are handrails on both sides of the stairs and use them. Fix handrails that are broken or loose. Make sure that handrails are as long as the stairways. Check any carpeting to make sure that it is firmly attached to the stairs. Fix any carpet that is loose or worn. Avoid having throw rugs at  the top or bottom of the stairs. If you do have throw rugs, attach them to the floor with carpet tape. Make sure that you have a light switch at the top of the stairs and the bottom of the stairs. If you do not have them, ask someone to add them for you. What else can I do to help prevent falls? Wear shoes that: Do not have high heels. Have rubber bottoms. Are comfortable and fit you well. Are closed at the toe. Do not wear sandals. If you use a stepladder: Make sure that it is fully opened. Do not climb a closed stepladder. Make sure that both sides of the stepladder are locked into place. Ask someone to hold it for you, if possible. Clearly mark and make sure that you can see: Any grab bars or handrails. First and last steps. Where the edge of each step is. Use tools that help you move around (mobility aids) if they are needed. These include: Canes. Walkers. Scooters. Crutches. Turn on the lights when you go into a dark area. Replace any light bulbs as soon as they burn out. Set up your furniture so you have a clear path. Avoid moving your furniture around. If any of your floors are uneven, fix them. If there are any pets around you, be aware of where they are. Review your medicines with your doctor. Some medicines can make you feel dizzy. This can increase your chance of falling. Ask your doctor what other things that you can do to help prevent falls. This information is not intended to replace advice given to you by your health care provider. Make sure you discuss any questions you have with your health care provider. Document Released: 12/21/2008 Document Revised: 08/02/2015 Document Reviewed: 03/31/2014 Elsevier Interactive Patient Education  2017 [employer].

## 2022-09-02 NOTE — Patient Instructions (Signed)
It was great to see you!  Please stay by your phone -- someone will call you about ordering the ultrasound  If any symptoms worsen, please reach out  We will get blood work to further evaluate your symptom(s)   To treat your constipation today: -Take 100 mg of docusate sodium (also known as Colace, however generic is fine!) by mouth twice daily to soften stools -Drink at least 64 oz of water daily -Add in 1 capful of polyethylene glycol (also known as Miralax, however generic is fine!) to beverage of choice daily  Goal is to have a formed, soft bowel movement regularly   -After a few days if no success, may increase to a total of 2 capfuls of polyethylene glycol/ Miralax daily  If still no results, please call the office    Take care,  Jarold Motto PA-C

## 2022-09-15 ENCOUNTER — Ambulatory Visit: Payer: Medicare Other | Admitting: Physician Assistant

## 2022-09-19 ENCOUNTER — Ambulatory Visit: Payer: Medicare Other | Admitting: Physician Assistant

## 2022-09-23 ENCOUNTER — Ambulatory Visit (INDEPENDENT_AMBULATORY_CARE_PROVIDER_SITE_OTHER): Payer: Medicare Other | Admitting: Physician Assistant

## 2022-09-23 ENCOUNTER — Encounter: Payer: Self-pay | Admitting: Physician Assistant

## 2022-09-23 VITALS — BP 120/76 | HR 72 | Temp 97.5°F | Ht 63.5 in | Wt 217.0 lb

## 2022-09-23 DIAGNOSIS — M1712 Unilateral primary osteoarthritis, left knee: Secondary | ICD-10-CM

## 2022-09-23 DIAGNOSIS — J302 Other seasonal allergic rhinitis: Secondary | ICD-10-CM | POA: Diagnosis not present

## 2022-09-23 DIAGNOSIS — Z636 Dependent relative needing care at home: Secondary | ICD-10-CM

## 2022-09-23 DIAGNOSIS — T7840XA Allergy, unspecified, initial encounter: Secondary | ICD-10-CM

## 2022-09-23 DIAGNOSIS — K219 Gastro-esophageal reflux disease without esophagitis: Secondary | ICD-10-CM

## 2022-09-23 MED ORDER — LEVOCETIRIZINE DIHYDROCHLORIDE 5 MG PO TABS: 5.0000 mg | ORAL_TABLET | Freq: Every evening | ORAL | 1 refills | Status: AC

## 2022-09-23 MED ORDER — ESOMEPRAZOLE MAGNESIUM 40 MG PO CPDR
40.0000 mg | DELAYED_RELEASE_CAPSULE | Freq: Every day | ORAL | 3 refills | Status: DC
Start: 2022-09-23 — End: 2022-12-30

## 2022-09-23 MED ORDER — TRAMADOL HCL 50 MG PO TABS
50.0000 mg | ORAL_TABLET | Freq: Two times a day (BID) | ORAL | 0 refills | Status: DC | PRN
Start: 2022-09-23 — End: 2023-04-01

## 2022-09-23 NOTE — Patient Instructions (Signed)
It was great to see you!  Get a baby monitor with a smartphone app/wifi so you can start having some peace of mind when you leave him.     Take care,  Jarold Motto PA-C

## 2022-09-23 NOTE — Progress Notes (Signed)
Virginia Cabrera is a 67 y.o. female here for a follow up of a pre-existing problem.  History of Present Illness:   Chief Complaint  Patient presents with   Anxiety   Medication Refill   Caregiver She has increased stress due to taking care of family member with decreased cognitive function.  He is essentially bedbound and she is managing all of his care. She has difficulty sleeping at night.  She wakes up frequently at night due to taking care of her family member needs.  Will occasionally take Klonopin 0.5 mg twice daily prn  Allergies:  Her allergies have worsened since her last visit.  She wakes up congested and sneezes frequently daily.  She has tried OTC medication to manage her symptoms and found no relief.  She is interested in trying Xyzal to manage her symptoms.   Refills: She is requesting a refill for 50 mg Tramadol, 40 mg Nexium.  She is taking Tramadol for osteoarthritis and Nexium for her GERD.   Past Medical History:  Diagnosis Date   Allergy    Anal fissure    Anxiety    Arthritis    Asthma    Colon polyp, hyperplastic    Depression    GERD (gastroesophageal reflux disease)    Hypertension    IBS (irritable bowel syndrome)    Vitamin deficiency      Social History   Tobacco Use   Smoking status: Former    Current packs/day: 0.00    Average packs/day: 0.2 packs/day for 15.0 years (3.0 ttl pk-yrs)    Types: Cigarettes    Start date: 08/24/1983    Quit date: 08/24/1998    Years since quitting: 24.0   Smokeless tobacco: Never  Vaping Use   Vaping status: Never Used  Substance Use Topics   Alcohol use: Yes    Comment: occssionally   Drug use: No    Past Surgical History:  Procedure Laterality Date   CESAREAN SECTION     TONSILLECTOMY      Family History  Problem Relation Age of Onset   Breast cancer Mother    Hypertension Mother    Melanoma Mother    Brain cancer Father    Hypertension Sister    Colon polyps Sister    Hypertension  Sister    Diabetes Brother    Kidney disease Brother    Hypertension Brother    Post-traumatic stress disorder Brother        war   Emphysema Maternal Grandmother    Heart disease Maternal Grandfather    Breast cancer Maternal Aunt    Stroke Maternal Aunt    Heart attack Maternal Aunt    Leukemia Maternal Aunt    Colon cancer Other    Esophageal cancer Other    Stomach cancer Other    Pancreatic cancer Other     Allergies  Allergen Reactions   Morphine Other (See Comments)    Causes "coma"   Morphine And Codeine     Current Medications:   Current Outpatient Medications:    albuterol (VENTOLIN HFA) 108 (90 Base) MCG/ACT inhaler, INHALE 1 PUFF BY MOUTH EVERY 4 TO 6 HOURS AS NEEDED, Disp: 18 g, Rfl: 2   amLODipine (NORVASC) 5 MG tablet, Take 1 tablet (5 mg total) by mouth daily., Disp: 90 tablet, Rfl: 3   baclofen (LIORESAL) 20 MG tablet, Take 1 tablet (20 mg total) by mouth 3 (three) times daily., Disp: 30 each, Rfl: 0   beclomethasone (QVAR  REDIHALER) 80 MCG/ACT inhaler, Inhale 2 puffs into the lungs 2 (two) times daily., Disp: 1 each, Rfl: 2   clonazePAM (KLONOPIN) 0.5 MG tablet, Take 1 tablet (0.5 mg total) by mouth 2 (two) times daily as needed. for anxiety, Disp: 60 tablet, Rfl: 0   HYDROcodone-acetaminophen (NORCO/VICODIN) 5-325 MG tablet, Take 1 tablet by mouth every 6 (six) hours as needed for moderate pain., Disp: 30 tablet, Rfl: 0   hydrOXYzine (ATARAX) 10 MG tablet, Take 1 tablet (10 mg total) by mouth 3 (three) times daily as needed., Disp: 90 tablet, Rfl: 11   levocetirizine (XYZAL) 5 MG tablet, Take 1 tablet (5 mg total) by mouth every evening., Disp: 30 tablet, Rfl: 1   losartan (COZAAR) 100 MG tablet, Take 1 tablet (100 mg total) by mouth daily., Disp: 90 tablet, Rfl: 3   meclizine (ANTIVERT) 25 MG tablet, Take 1 tablet (25 mg total) by mouth 3 (three) times daily as needed for dizziness., Disp: 30 tablet, Rfl: 0   spironolactone (ALDACTONE) 25 MG tablet, Take 1  tablet (25 mg total) by mouth daily., Disp: 90 tablet, Rfl: 3   traZODone (DESYREL) 100 MG tablet, TK 1 T PO HS PRN, Disp: , Rfl:    Vitamin D, Ergocalciferol, (DRISDOL) 1.25 MG (50000 UNIT) CAPS capsule, Take 1 capsule (50,000 Units total) by mouth every 7 (seven) days., Disp: 12 capsule, Rfl: 0   cholecalciferol (VITAMIN D3) 25 MCG (1000 UNIT) tablet, Take 1,000 Units by mouth daily. (Patient not taking: Reported on 09/23/2022), Disp: , Rfl:    esomeprazole (NEXIUM) 40 MG capsule, Take 1 capsule (40 mg total) by mouth daily at 12 noon., Disp: 90 capsule, Rfl: 3   traMADol (ULTRAM) 50 MG tablet, Take 1 tablet (50 mg total) by mouth every 12 (twelve) hours as needed., Disp: 180 tablet, Rfl: 0   Review of Systems:   Review of Systems  Psychiatric/Behavioral:  The patient is nervous/anxious (from stress) and has insomnia.     Vitals:   Vitals:   09/23/22 1033  BP: 120/76  Pulse: 72  Temp: (!) 97.5 F (36.4 C)  TempSrc: Temporal  SpO2: 98%  Weight: 217 lb (98.4 kg)  Height: 5' 3.5" (1.613 m)     Body mass index is 37.84 kg/m.  Physical Exam:   Physical Exam Vitals and nursing note reviewed.  Constitutional:      General: She is not in acute distress.    Appearance: She is well-developed. She is not ill-appearing or toxic-appearing.  Cardiovascular:     Rate and Rhythm: Normal rate and regular rhythm.     Pulses: Normal pulses.     Heart sounds: Normal heart sounds, S1 normal and S2 normal.  Pulmonary:     Effort: Pulmonary effort is normal.     Breath sounds: Normal breath sounds.  Skin:    General: Skin is warm and dry.  Neurological:     Mental Status: She is alert.     GCS: GCS eye subscore is 4. GCS verbal subscore is 5. GCS motor subscore is 6.  Psychiatric:        Speech: Speech normal.        Behavior: Behavior normal. Behavior is cooperative.     Assessment and Plan:   Caregiver burden Emotional support provided Refill Klonopin 0.5 mg twice daily as  needed for stress Follow-up in 3-6 month(s), sooner if concerns  Gastroesophageal reflux disease, unspecified whether esophagitis present Refill Nexium 40 mg daily Follow-up in 1 year, sooner  if concerns  Osteoarthritis of left knee, unspecified osteoarthritis type Well controlled overall with current regimen Will refill Tramadol 50 mg as needed for episodes of recurrent pain Follow-up in 6 months, sooner if concerns  Allergy, initial encounter Uncontrolled Trial xyzal 5 mg and follow-up prn  I,Shehryar Baig,acting as a scribe for [employer], PA.,have documented all relevant documentation on the behalf of Jarold Motto, PA,as directed by  Jarold Motto, PA while in the presence of Jarold Motto, [place].  I, Jarold Motto, [place], have reviewed all documentation for this visit. The documentation on 09/23/22 for the exam, diagnosis, procedures, and orders are all accurate and complete.  Jarold Motto, PA-C

## 2022-09-26 ENCOUNTER — Encounter: Payer: Self-pay | Admitting: Physician Assistant

## 2022-09-29 ENCOUNTER — Telehealth: Payer: Self-pay | Admitting: Physician Assistant

## 2022-09-29 NOTE — Telephone Encounter (Signed)
Please call patient and offer emotional support while her ex-husband is currently admitted to the hospital.  Please offer referral for talk therapy or office visit with [place] if needed.  [name]

## 2022-09-29 NOTE — Telephone Encounter (Signed)
Spoke to pt told her calling to see how she was since we heard your ex-husband is back in the hospital. Pt said yes, back in due to unable to maintain blood pressure and low potassium. We are just taking it day by day. Asked pt if she would like to be referred for talk therapy or visit with [name] to talk? Pt said yes, visit with Samantha. Told her okay and it can be virtual. That would be good. Told her I will have the schedulers call her back right away to schedule this week. Pt verbalized understanding.

## 2022-10-01 ENCOUNTER — Telehealth (INDEPENDENT_AMBULATORY_CARE_PROVIDER_SITE_OTHER): Payer: Medicare Other | Admitting: Physician Assistant

## 2022-10-01 ENCOUNTER — Encounter: Payer: Self-pay | Admitting: Physician Assistant

## 2022-10-01 VITALS — Ht 63.5 in | Wt 214.0 lb

## 2022-10-01 DIAGNOSIS — Z636 Dependent relative needing care at home: Secondary | ICD-10-CM

## 2022-10-01 DIAGNOSIS — F411 Generalized anxiety disorder: Secondary | ICD-10-CM

## 2022-10-01 MED ORDER — BUSPIRONE HCL 5 MG PO TABS: 5.0000 mg | ORAL_TABLET | Freq: Two times a day (BID) | ORAL | 1 refills | Status: AC

## 2022-10-01 NOTE — Progress Notes (Signed)
Virtual Visit via Video Note   I, Jarold Motto, PA, connected with  Virginia Cabrera  ([id number], Sep 21, 1955) on 10/01/22 at  9:40 AM EDT by a video-enabled telemedicine application and verified that I am speaking with the correct person using two identifiers.  Location: Patient: Home Provider: [facility] office   I discussed the limitations of evaluation and management by telemedicine and the availability of in person appointments. The patient expressed understanding and agreed to proceed.    History of Present Illness: Virginia Cabrera is a 67 y.o. who identifies as a female who was assigned female at birth, and is being seen today for depression and anxiety.   Pt wanting to discuss her mental health issues due to ex-husband's declining condition. He should be coming home today for home hospice care. Was on Lexapro in the past and did ok with this but is not interested in resuming this. When she takes hydroxyzine or klonopin it makes her very sleepy.  She is also considering counseling resources.  Denies SI/HI.   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    Allergies:  Allergies  Allergen Reactions   Morphine Other (See Comments)    Causes "coma"   Morphine And Codeine    Medications:  Current Outpatient Medications:    albuterol (VENTOLIN HFA) 108 (90 Base) MCG/ACT inhaler, INHALE 1 PUFF BY MOUTH EVERY 4 TO 6 HOURS AS NEEDED, Disp: 18 g, Rfl: 2   amLODipine (NORVASC) 5 MG tablet, Take 1 tablet (5 mg total) by mouth daily., Disp: 90 tablet, Rfl: 3   baclofen (LIORESAL) 20 MG tablet,  Take 1 tablet (20 mg total) by mouth 3 (three) times daily., Disp: 30 each, Rfl: 0   beclomethasone (QVAR REDIHALER) 80 MCG/ACT inhaler, Inhale 2 puffs into the lungs 2 (two) times daily., Disp: 1 each, Rfl: 2   busPIRone (BUSPAR) 5 MG tablet, Take 1 tablet (5 mg total) by mouth 2 (two) times daily., Disp: 30 tablet, Rfl: 1   cholecalciferol (VITAMIN D3) 25 MCG (1000 UNIT) tablet, Take 1,000 Units by mouth daily., Disp: , Rfl:    clonazePAM (KLONOPIN) 0.5 MG tablet, Take 1 tablet (0.5 mg total) by mouth 2 (two) times daily as needed. for anxiety, Disp: 60 tablet, Rfl: 0   esomeprazole (NEXIUM) 40 MG capsule, Take 1 capsule (40 mg total) by mouth daily at 12 noon., Disp: 90 capsule, Rfl: 3   HYDROcodone-acetaminophen (NORCO/VICODIN) 5-325 MG tablet, Take 1 tablet by mouth every 6 (six) hours as needed for moderate pain., Disp: 30 tablet, Rfl: 0   hydrOXYzine (ATARAX) 10 MG tablet, Take 1 tablet (10 mg total) by mouth 3 (three) times daily as needed., Disp: 90 tablet, Rfl: 11   levocetirizine (XYZAL) 5 MG tablet, Take 1 tablet (5 mg total) by mouth every evening., Disp: 30 tablet, Rfl: 1   losartan (COZAAR) 100 MG tablet, Take 1 tablet (100 mg total) by mouth daily., Disp: 90 tablet, Rfl: 3   meclizine (ANTIVERT) 25 MG tablet, Take 1 tablet (25 mg total) by mouth 3 (three) times daily as needed for dizziness., Disp: 30 tablet,  Rfl: 0   spironolactone (ALDACTONE) 25 MG tablet, Take 1 tablet (25 mg total) by mouth daily., Disp: 90 tablet, Rfl: 3   traMADol (ULTRAM) 50 MG tablet, Take 1 tablet (50 mg total) by mouth every 12 (twelve) hours as needed., Disp: 180 tablet, Rfl: 0   traZODone (DESYREL) 100 MG tablet, TK 1 T PO HS PRN, Disp: , Rfl:    Vitamin D, Ergocalciferol, (DRISDOL) 1.25 MG (50000 UNIT) CAPS capsule, Take 1 capsule (50,000 Units total) by mouth every 7 (seven) days., Disp: 12 capsule, Rfl: 0  Observations/Objective: Patient is well-developed, well-nourished in no acute distress.  Resting  comfortably  at home.  Head is normocephalic, atraumatic.  No labored breathing.  Speech is clear and coherent with logical content.  Patient is alert and oriented at baseline.   Assessment and Plan: Caregiver burden; Generalized anxiety disorder Ongoing Denies SI/HI Declines daily SSRI Will trial as needed buspar 5 mg  Follow-up in 2-4 weeks, sooner if concerns Information provided on local grief support groups  Follow Up Instructions: I discussed the assessment and treatment plan with the patient. The patient was provided an opportunity to ask questions and all were answered. The patient agreed with the plan and demonstrated an understanding of the instructions.  A copy of instructions were sent to the patient via MyChart unless otherwise noted below.   The patient was advised to call back or seek an in-person evaluation if the symptoms worsen or if the condition fails to improve as anticipated.  Jarold Motto, [place]

## 2022-10-07 ENCOUNTER — Other Ambulatory Visit: Payer: Self-pay | Admitting: Physician Assistant

## 2022-10-07 DIAGNOSIS — R0602 Shortness of breath: Secondary | ICD-10-CM

## 2022-11-20 ENCOUNTER — Other Ambulatory Visit: Payer: Self-pay | Admitting: Physician Assistant

## 2022-12-12 ENCOUNTER — Other Ambulatory Visit: Payer: Self-pay | Admitting: Physician Assistant

## 2022-12-12 DIAGNOSIS — Z1212 Encounter for screening for malignant neoplasm of rectum: Secondary | ICD-10-CM

## 2022-12-12 DIAGNOSIS — Z1211 Encounter for screening for malignant neoplasm of colon: Secondary | ICD-10-CM

## 2022-12-23 ENCOUNTER — Encounter: Payer: Self-pay | Admitting: Physician Assistant

## 2022-12-29 NOTE — Progress Notes (Signed)
Virginia Cabrera is a 67 y.o. female here for a follow-up of a pre-existing problem. History of Present Illness:  No chief complaint on file.   HPI ***: {ELStarters:31163} *** *** *** *** *** ***  *** *** *** *** *** Past Medical History:  Diagnosis Date   Allergy    Anal fissure    Anxiety    Arthritis    Asthma    Colon polyp, hyperplastic    Depression    GERD (gastroesophageal reflux disease)    Hypertension    IBS (irritable bowel syndrome)    Vitamin deficiency     Social History   Tobacco Use   Smoking status: Former    Current packs/day: 0.00    Average packs/day: 0.2 packs/day for 15.0 years (3.0 ttl pk-yrs)    Types: Cigarettes    Start date: 08/24/1983    Quit date: 08/24/1998    Years since quitting: 24.3   Smokeless tobacco: Never  Vaping Use   Vaping status: Never Used  Substance Use Topics   Alcohol use: Yes    Comment: occssionally   Drug use: No   Past Surgical History:  Procedure Laterality Date   CESAREAN SECTION     TONSILLECTOMY     Family History  Problem Relation Age of Onset   Breast cancer Mother    Hypertension Mother    Melanoma Mother    Brain cancer Father    Hypertension Sister    Colon polyps Sister    Hypertension Sister    Diabetes Brother    Kidney disease Brother    Hypertension Brother    Post-traumatic stress disorder Brother        war   Emphysema Maternal Grandmother    Heart disease Maternal Grandfather    Breast cancer Maternal Aunt    Stroke Maternal Aunt    Heart attack Maternal Aunt    Leukemia Maternal Aunt    Colon cancer Other    Esophageal cancer Other    Stomach cancer Other    Pancreatic cancer Other    Allergies  Allergen Reactions   Morphine Other (See Comments)    Causes "coma"   Morphine And Codeine    Current Medications:   Current Outpatient Medications:    albuterol (VENTOLIN HFA) 108 (90 Base) MCG/ACT inhaler, INHALE 1 PUFF BY MOUTH EVERY 4 TO 6 HOURS AS NEEDED, Disp: 18  g, Rfl: 2   amLODipine (NORVASC) 5 MG tablet, Take 1 tablet (5 mg total) by mouth daily., Disp: 90 tablet, Rfl: 3   baclofen (LIORESAL) 20 MG tablet, Take 1 tablet (20 mg total) by mouth 3 (three) times daily., Disp: 30 each, Rfl: 0   beclomethasone (QVAR REDIHALER) 80 MCG/ACT inhaler, Inhale 2 puffs by mouth twice daily, Disp: 11 g, Rfl: 2   busPIRone (BUSPAR) 5 MG tablet, Take 1 tablet (5 mg total) by mouth 2 (two) times daily., Disp: 30 tablet, Rfl: 1   cholecalciferol (VITAMIN D3) 25 MCG (1000 UNIT) tablet, Take 1,000 Units by mouth daily., Disp: , Rfl:    clonazePAM (KLONOPIN) 0.5 MG tablet, Take 1 tablet (0.5 mg total) by mouth 2 (two) times daily as needed. for anxiety, Disp: 60 tablet, Rfl: 0   esomeprazole (NEXIUM) 40 MG capsule, Take 1 capsule (40 mg total) by mouth daily at 12 noon., Disp: 90 capsule, Rfl: 3   HYDROcodone-acetaminophen (NORCO/VICODIN) 5-325 MG tablet, Take 1 tablet by mouth every 6 (six) hours as needed for moderate pain., Disp: 30 tablet,  Rfl: 0   hydrOXYzine (ATARAX) 10 MG tablet, Take 1 tablet (10 mg total) by mouth 3 (three) times daily as needed., Disp: 90 tablet, Rfl: 11   levocetirizine (XYZAL) 5 MG tablet, Take 1 tablet (5 mg total) by mouth every evening., Disp: 30 tablet, Rfl: 1   losartan (COZAAR) 100 MG tablet, Take 1 tablet (100 mg total) by mouth daily., Disp: 90 tablet, Rfl: 3   meclizine (ANTIVERT) 25 MG tablet, Take 1 tablet (25 mg total) by mouth 3 (three) times daily as needed for dizziness., Disp: 30 tablet, Rfl: 0   spironolactone (ALDACTONE) 25 MG tablet, Take 1 tablet (25 mg total) by mouth daily., Disp: 90 tablet, Rfl: 3   traMADol (ULTRAM) 50 MG tablet, Take 1 tablet (50 mg total) by mouth every 12 (twelve) hours as needed., Disp: 180 tablet, Rfl: 0   traZODone (DESYREL) 100 MG tablet, TK 1 T PO HS PRN, Disp: , Rfl:    Vitamin D, Ergocalciferol, (DRISDOL) 1.25 MG (50000 UNIT) CAPS capsule, Take 1 capsule (50,000 Units total) by mouth every 7  (seven) days., Disp: 12 capsule, Rfl: 0  Review of Systems:   ROS See pertinent positives and negatives as per the HPI.  Vitals:   There were no vitals filed for this visit.   There is no height or weight on file to calculate BMI.  Physical Exam:   Physical Exam  Assessment and Plan:   There are no diagnoses linked to this encounter.            I,Emily Lagle,acting as a Neurosurgeon for [employer], PA.,have documented all relevant documentation on the behalf of Jarold Motto, PA,as directed by  Jarold Motto, PA while in the presence of Jarold Motto, [place].  *** (refresh reminder)  I, Jarold Motto, PA, have reviewed all documentation for this visit. The documentation on 12/29/22 for the exam, diagnosis, procedures, and orders are all accurate and complete.  Jarold Motto, PA-C

## 2022-12-30 ENCOUNTER — Encounter: Payer: Self-pay | Admitting: Physician Assistant

## 2022-12-30 ENCOUNTER — Ambulatory Visit: Payer: Medicare Other | Admitting: Physician Assistant

## 2022-12-30 VITALS — BP 122/70 | HR 74 | Temp 97.1°F | Ht 63.5 in | Wt 217.4 lb

## 2022-12-30 DIAGNOSIS — M1712 Unilateral primary osteoarthritis, left knee: Secondary | ICD-10-CM

## 2022-12-30 DIAGNOSIS — E559 Vitamin D deficiency, unspecified: Secondary | ICD-10-CM

## 2022-12-30 DIAGNOSIS — R1010 Upper abdominal pain, unspecified: Secondary | ICD-10-CM

## 2022-12-30 DIAGNOSIS — R252 Cramp and spasm: Secondary | ICD-10-CM

## 2022-12-30 DIAGNOSIS — F419 Anxiety disorder, unspecified: Secondary | ICD-10-CM

## 2022-12-30 DIAGNOSIS — K219 Gastro-esophageal reflux disease without esophagitis: Secondary | ICD-10-CM | POA: Diagnosis not present

## 2022-12-30 DIAGNOSIS — F32A Depression, unspecified: Secondary | ICD-10-CM

## 2022-12-30 LAB — TSH: TSH: 0.85 u[IU]/mL (ref 0.35–5.50)

## 2022-12-30 LAB — CBC WITH DIFFERENTIAL/PLATELET
Basophils Absolute: 0 10*3/uL (ref 0.0–0.1)
Basophils Relative: 0.6 % (ref 0.0–3.0)
Eosinophils Absolute: 0.1 10*3/uL (ref 0.0–0.7)
Eosinophils Relative: 2.2 % (ref 0.0–5.0)
HCT: 40.4 % (ref 36.0–46.0)
Hemoglobin: 13.3 g/dL (ref 12.0–15.0)
Lymphocytes Relative: 31.2 % (ref 12.0–46.0)
Lymphs Abs: 2.1 10*3/uL (ref 0.7–4.0)
MCHC: 32.8 g/dL (ref 30.0–36.0)
MCV: 91.2 fL (ref 78.0–100.0)
Monocytes Absolute: 0.5 10*3/uL (ref 0.1–1.0)
Monocytes Relative: 7.6 % (ref 3.0–12.0)
Neutro Abs: 3.9 10*3/uL (ref 1.4–7.7)
Neutrophils Relative %: 58.4 % (ref 43.0–77.0)
Platelets: 247 10*3/uL (ref 150.0–400.0)
RBC: 4.44 Mil/uL (ref 3.87–5.11)
RDW: 12.9 % (ref 11.5–15.5)
WBC: 6.6 10*3/uL (ref 4.0–10.5)

## 2022-12-30 LAB — VITAMIN B12: Vitamin B-12: 135 pg/mL — ABNORMAL LOW (ref 211–911)

## 2022-12-30 LAB — IBC + FERRITIN
Ferritin: 94.3 ng/mL (ref 10.0–291.0)
Iron: 112 ug/dL (ref 42–145)
Saturation Ratios: 29.9 % (ref 20.0–50.0)
TIBC: 375.2 ug/dL (ref 250.0–450.0)
Transferrin: 268 mg/dL (ref 212.0–360.0)

## 2022-12-30 LAB — VITAMIN D 25 HYDROXY (VIT D DEFICIENCY, FRACTURES): VITD: 31.74 ng/mL (ref 30.00–100.00)

## 2022-12-30 LAB — COMPREHENSIVE METABOLIC PANEL
ALT: 21 U/L (ref 0–35)
AST: 22 U/L (ref 0–37)
Albumin: 4.4 g/dL (ref 3.5–5.2)
Alkaline Phosphatase: 54 U/L (ref 39–117)
BUN: 19 mg/dL (ref 6–23)
CO2: 26 meq/L (ref 19–32)
Calcium: 9.7 mg/dL (ref 8.4–10.5)
Chloride: 103 meq/L (ref 96–112)
Creatinine, Ser: 1.18 mg/dL (ref 0.40–1.20)
GFR: 48.68 mL/min — ABNORMAL LOW (ref >60.00)
Glucose, Bld: 108 mg/dL — ABNORMAL HIGH (ref 70–99)
Potassium: 4.2 meq/L (ref 3.5–5.1)
Sodium: 139 meq/L (ref 135–145)
Total Bilirubin: 0.5 mg/dL (ref 0.2–1.2)
Total Protein: 7.4 g/dL (ref 6.0–8.3)

## 2022-12-30 MED ORDER — ESOMEPRAZOLE MAGNESIUM 40 MG PO CPDR
40.0000 mg | DELAYED_RELEASE_CAPSULE | Freq: Every day | ORAL | 3 refills | Status: AC
Start: 2022-12-30 — End: ?

## 2022-12-30 MED ORDER — HYDROCODONE-ACETAMINOPHEN 5-325 MG PO TABS: 1.0000 | ORAL_TABLET | Freq: Four times a day (QID) | ORAL | 0 refills | Status: DC | PRN

## 2022-12-30 NOTE — Patient Instructions (Addendum)
It was great to see you!  We will update blood work today  I will order CT of your abdomen to evaluate for your upper abdominal pain  I will refer to sports medicine for your ongoing leg cramps/pain A referral has been placed for you to see one of our fantastic providers at [facility]. Someone from their office will be in touch soon regarding scheduling your appointment.  Their location:  [facility] at [facility]  [address] on the 1st floor Phone number [phone] Fax [phone].   This location is across the street from the entrance to [employer] [facility] and in the same complex as the [facility]  Let's follow-up in 3 months for Comprehensive Physical Exam (CPE) preventive care annual visit,  sooner if you have concerns.  Take care,  Jarold Motto PA-C

## 2022-12-31 ENCOUNTER — Encounter: Payer: Self-pay | Admitting: Physician Assistant

## 2022-12-31 DIAGNOSIS — N183 Chronic kidney disease, stage 3 unspecified: Secondary | ICD-10-CM | POA: Insufficient documentation

## 2023-01-05 ENCOUNTER — Ambulatory Visit (INDEPENDENT_AMBULATORY_CARE_PROVIDER_SITE_OTHER): Payer: Medicare Other

## 2023-01-05 ENCOUNTER — Ambulatory Visit (INDEPENDENT_AMBULATORY_CARE_PROVIDER_SITE_OTHER): Payer: Medicare Other | Admitting: Family Medicine

## 2023-01-05 VITALS — BP 136/86 | HR 82 | Ht 63.5 in | Wt 214.0 lb

## 2023-01-05 DIAGNOSIS — M79651 Pain in right thigh: Secondary | ICD-10-CM | POA: Diagnosis not present

## 2023-01-05 DIAGNOSIS — G8929 Other chronic pain: Secondary | ICD-10-CM

## 2023-01-05 DIAGNOSIS — M791 Myalgia, unspecified site: Secondary | ICD-10-CM

## 2023-01-05 DIAGNOSIS — M5441 Lumbago with sciatica, right side: Secondary | ICD-10-CM

## 2023-01-05 LAB — SEDIMENTATION RATE: Sed Rate: 20 mm/h (ref 0–30)

## 2023-01-05 LAB — C-REACTIVE PROTEIN: CRP: <1 mg/dL (ref 0.5–20.0)

## 2023-01-05 LAB — CK: Total CK: 153 U/L (ref 7–177)

## 2023-01-05 NOTE — Patient Instructions (Signed)
Thank you for coming in today.   Please get an Xray today before you leave   I've referred you to Physical Therapy.  Let us know if you don't hear from them in one week.   Please get labs today before you leave   Recheck in 6 weeks.   Let me know if you are having problems or not getting better.

## 2023-01-05 NOTE — Progress Notes (Signed)
Rubin Payor, PhD, LAT, ATC acting as a scribe for Clementeen Graham, MD.  Virginia Cabrera is a 67 y.o. female who presents to [facility] at [facility] today for leg cramping, mostly R-side, worsening recently. Pt locates pain to the medial aspect of the distal thigh and then travels along the anterior aspect of the thigh to the lateral and posterior hip.   Low back pain: yes- burning pain, anterior thigh Aggravates: standing Treatments tried: topical creams, norco, massage  Pertinent review of systems: No fevers or chills  Relevant historical information: Hypertension.  CKD.  Prolonged QTc interval on EKG   Exam:  BP 136/86   Pulse 82   Ht 5' 3.5" (1.613 m)   Wt 214 lb (97.1 kg)   SpO2 97%   BMI 37.31 kg/m  General: Well Developed, well nourished, and in no acute distress.   MSK: L-spine: Normal appearing Normal lumbar motion. Lower extremity strength is intact. Slump test and straight raise test are negative.  Right hip normal appearing. Normal hip motion.  Intact strength.  Thigh musculature is nontender to palpation.    Lab and Radiology Results  X-ray images lumbar spine personally and independently interpreted today.  Lumbar spine: Mild multilevel degenerative changes worse at L5-S1.  No acute fractures are visible. Await formal radiology review    Assessment and Plan: 67 y.o. female with right anterior thigh pain and cramping thought to be lumbar radiculopathy likely L3.  There may be a hip component or maybe a inflammatory condition.  She had a pretty good metabolic workup already with PCP.  Will finish out rheumatologic or inflammatory workup today with labs listed below.  Plan for trial of physical therapy.  Recheck in 6 weeks.  Return sooner if needed.   PDMP not reviewed this encounter. Orders Placed This Encounter  Procedures   DG Lumbar Spine 2-3 Views    Standing Status:   Future    Number of Occurrences:   1    Standing  Expiration Date:   02/05/2023    Order Specific Question:   Reason for Exam (SYMPTOM  OR DIAGNOSIS REQUIRED)    Answer:   low back pain    Order Specific Question:   Preferred imaging location?    Answer:   [facility]   CK (Creatine Kinase)    Standing Status:   Future    Number of Occurrences:   1    Standing Expiration Date:   01/05/2024   Sedimentation rate    Standing Status:   Future    Number of Occurrences:   1    Standing Expiration Date:   01/05/2024   C-reactive protein    Standing Status:   Future    Number of Occurrences:   1    Standing Expiration Date:   01/05/2024   ANA    Standing Status:   Future    Number of Occurrences:   1    Standing Expiration Date:   01/05/2024   Rheumatoid factor    Standing Status:   Future    Number of Occurrences:   1    Standing Expiration Date:   01/05/2024   Ambulatory referral to Physical Therapy    Referral Priority:   Routine    Referral Type:   Physical Medicine    Referral Reason:   Specialty Services Required    Requested Specialty:   Physical Therapy    Number of Visits Requested:   1  No orders of the defined types were placed in this encounter.    Discussed warning signs or symptoms. Please see discharge instructions. Patient expresses understanding.   The above documentation has been reviewed and is accurate and complete Clementeen Graham, M.D.

## 2023-01-06 ENCOUNTER — Ambulatory Visit
Admission: RE | Admit: 2023-01-06 | Discharge: 2023-01-06 | Disposition: A | Discharge status: Home / Self Care | Payer: Medicare Other | Source: Ambulatory Visit | Attending: Physician Assistant | Admitting: Physician Assistant

## 2023-01-06 DIAGNOSIS — R1010 Upper abdominal pain, unspecified: Secondary | ICD-10-CM

## 2023-01-06 LAB — RHEUMATOID FACTOR: Rheumatoid fact SerPl-aCnc: 11 [IU]/mL (ref <14)

## 2023-01-06 MED ORDER — IOPAMIDOL (ISOVUE-300) INJECTION 61%
100.0000 mL | Freq: Once | INTRAVENOUS | Status: AC | PRN
  Administered 2023-01-06: 100 mL via INTRAVENOUS

## 2023-01-08 LAB — ANTI-NUCLEAR AB-TITER (ANA TITER): ANA Titer 1: 1:40 {titer} — ABNORMAL HIGH

## 2023-01-08 LAB — ANA: Anti Nuclear Antibody (ANA): POSITIVE — AB

## 2023-01-12 ENCOUNTER — Other Ambulatory Visit: Payer: Self-pay

## 2023-01-12 ENCOUNTER — Telehealth: Payer: Self-pay | Admitting: Family Medicine

## 2023-01-12 ENCOUNTER — Encounter: Payer: Self-pay | Admitting: Physical Therapy

## 2023-01-12 ENCOUNTER — Ambulatory Visit: Payer: Medicare Other | Admitting: Physical Therapy

## 2023-01-12 DIAGNOSIS — R29898 Other symptoms and signs involving the musculoskeletal system: Secondary | ICD-10-CM

## 2023-01-12 DIAGNOSIS — M79651 Pain in right thigh: Secondary | ICD-10-CM

## 2023-01-12 DIAGNOSIS — M791 Myalgia, unspecified site: Secondary | ICD-10-CM

## 2023-01-12 DIAGNOSIS — S32040A Wedge compression fracture of fourth lumbar vertebra, initial encounter for closed fracture: Secondary | ICD-10-CM

## 2023-01-12 DIAGNOSIS — M79604 Pain in right leg: Secondary | ICD-10-CM

## 2023-01-12 DIAGNOSIS — G8929 Other chronic pain: Secondary | ICD-10-CM

## 2023-01-12 DIAGNOSIS — M6281 Muscle weakness (generalized): Secondary | ICD-10-CM | POA: Diagnosis not present

## 2023-01-12 DIAGNOSIS — M5459 Other low back pain: Secondary | ICD-10-CM

## 2023-01-12 DIAGNOSIS — R262 Difficulty in walking, not elsewhere classified: Secondary | ICD-10-CM

## 2023-01-12 NOTE — Progress Notes (Signed)
Your ANA was very minimally positive.  All other inflammatory or rheumatologic labs were normal.  I do not think you have a rheumatologic problem.

## 2023-01-12 NOTE — Therapy (Signed)
OUTPATIENT PHYSICAL THERAPY THORACOLUMBAR EVALUATION   Patient Name: Virginia Cabrera MRN: [id number] DOB:1955-03-17, 67 y.o., female Today's Date: 01/12/2023  END OF SESSION:  PT End of Session - 01/12/23 1312     Visit Number 1    Number of Visits 9    Date for PT Re-Evaluation 03/09/23    Authorization Type [employer] MCR    Authorization Time Period 01/12/23 to 03/09/23    PT Start Time 1217    PT Stop Time 1256    PT Time Calculation (min) 39 min    Activity Tolerance Patient tolerated treatment well    Behavior During Therapy WFL for tasks assessed/performed             Past Medical History:  Diagnosis Date   Allergy    Anal fissure    Anxiety    Arthritis    Asthma    Colon polyp, hyperplastic    Depression    GERD (gastroesophageal reflux disease)    Hypertension    IBS (irritable bowel syndrome)    Vitamin deficiency    Past Surgical History:  Procedure Laterality Date   CESAREAN SECTION     TONSILLECTOMY     Patient Active Problem List   Diagnosis Date Noted   CKD (chronic kidney disease) stage 3, GFR 30-59 ml/min (HCC) 12/31/2022   Osteoarthritis of left knee 10/09/2021   Vitamin D deficiency 10/09/2021   Essential hypertension 03/23/2020   Prolonged Q-T interval on ECG 02/07/2019   Gastroesophageal reflux disease 12/17/2018   Traumatic incomplete tear of right rotator cuff 04/08/2018   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    PCP: Jarold Motto PA-C   REFERRING PROVIDER: Rodolph Bong, MD  REFERRING DIAG:  Diagnosis  M54.41,G89.29 (ICD-10-CM) - Chronic right-sided low back pain with right-sided sciatica  M79.651 (ICD-10-CM) - Right thigh pain  M79.10 (ICD-10-CM) - Myalgia    Rationale for Evaluation and Treatment: Rehabilitation  THERAPY DIAG:  Other low back pain  Pain in right leg  Muscle weakness (generalized)  Difficulty in walking, not elsewhere classified  Other symptoms and signs involving the musculoskeletal system  ONSET DATE:  symptoms really got bad within the past month (acute on chronic)  SUBJECTIVE:                                                                                                                                                                                           SUBJECTIVE STATEMENT:  Having some back pain, they just scheduled an MRI because the other images showed a compression fracture. Its really debilitating when the cramps go from my thigh up to my hip. They  start on my inner knee and goes diagonal across my leg to my hip. My back always hurts, sometimes the pain from my thigh will spread up into the back. I feel like I have to drag my leg due to pain sometimes. At night I get cramps in my lower legs. No injuries or weird movements that started this problem. Took vitamin D in the past, if I do a lot of walking I start hurting, if I have to stand still in one spot my legs start burning. Very frustrated by this pain. When I laugh a lot I get a lot of cramping here (points to diaphragm level), deep breaths have been making that area cramp as well.   PERTINENT HISTORY:  Anxiety, OA, HTN, IBS  PAIN:  Are you having pain? Yes: NPRS scale: 5-6/10 Pain location: right hip going into back Pain description: "hard to describe"  Aggravating factors: standing still, walking too much, sitting in the wrong position, "it just comes out of the blue"  Relieving factors: not sure   PRECAUTIONS: Other: age indeterminate compression fracture, awaiting MRI for clarification, unable to tolerate laying supine   RED FLAGS: None   WEIGHT BEARING RESTRICTIONS: No  FALLS:  Has patient fallen in last 6 months? Yes. Number of falls 1- trip on steps, some FOF   LIVING ENVIRONMENT: Lives with: lives alone Lives in: House/apartment Stairs: 2 STE in front, 4-5 in back Has following equipment at home: None  OCCUPATION: on disability   PLOF: Independent, Independent with basic ADLs, Independent with gait, and  Independent with transfers  PATIENT GOALS: stop the cramps, stop locking up   NEXT MD VISIT: Dr. Denyse Amass 02/16/23 (referring)  OBJECTIVE:  Note: Objective measures were completed at Evaluation unless otherwise noted.  DIAGNOSTIC FINDINGS:  CLINICAL DATA:  Acute nonlocalized abdominal pain.   EXAM: CT ABDOMEN AND PELVIS WITH CONTRAST   TECHNIQUE: Multidetector CT imaging of the abdomen and pelvis was performed using the standard protocol following bolus administration of intravenous contrast.   RADIATION DOSE REDUCTION: This exam was performed according to the departmental dose-optimization program which includes automated exposure control, adjustment of the mA and/or kV according to patient size and/or use of iterative reconstruction technique.   CONTRAST:  ISOVUE-300 IOPAMIDOL (ISOVUE-300) INJECTION 61%   COMPARISON:  None Available.   FINDINGS: Lower chest: No acute abnormality.   Hepatobiliary: No suspicious hepatic lesion. Gallbladder is unremarkable. No biliary ductal dilation.   Pancreas: No pancreatic ductal dilation or evidence of acute inflammation.   Spleen: No splenomegaly.   Adrenals/Urinary Tract: Bilateral adrenal glands appear normal. No hydronephrosis. Kidneys demonstrate symmetric enhancement. Urinary bladder is unremarkable for degree of distension.   Stomach/Bowel: Radiopaque enteric contrast material traverses the splenic flexure. Stomach is unremarkable for degree of distension. No pathologic dilation of small or large bowel. Normal appendix. Scattered colonic diverticulosis without findings of acute diverticulitis.   Vascular/Lymphatic: Aortic atherosclerosis. Smooth IVC contours. The portal, splenic and superior mesenteric veins are patent. No pathologically enlarged abdominal or pelvic lymph nodes.   Reproductive: Uterus and bilateral adnexa are unremarkable.   Other: No significant abdominopelvic free fluid.   Musculoskeletal: Age  indeterminate mild compression deformity of the L4 vertebral body. Multilevel degenerative changes spine.   IMPRESSION: 1. No acute abnormality in the abdomen or pelvis. 2. Scattered colonic diverticulosis without findings of acute diverticulitis. 3. Age indeterminate mild compression deformity of the L4 vertebral body, suggest correlation with tenderness to palpation to assess acuity. 4.  Aortic Atherosclerosis (  ICD10-I70.0).  PATIENT SURVEYS:  FOTO 36 predicted 45 in 12 visits   SCREENING FOR RED FLAGS:  Compression fracture: Yes: age indeterminate    COGNITION: Overall cognitive status: Within functional limits for tasks assessed     SENSATION: Not tested no numbness at rest, can get numbness going down to knees with extended standing   MUSCLE LENGTH:  Quads and hip flexors tight per functional observation, hamstrings moderate limitation B per functional observation   POSTURE: rounded shoulders, forward head, increased lumbar lordosis, and flexed trunk   PALPATION: TTP R lumbar paraspinals, R lateral hip , medial quad R (does have some broken blood vessels per her report)  LUMBAR ROM:   AROM eval  Flexion Moderate limitation; RFIS   Extension Severe limitation, sharp pains deferred REIS   Right lateral flexion Severe limitation   Left lateral flexion Severe limitation   Right rotation   Left rotation    (Blank rows = not tested)   LOWER EXTREMITY MMT:    MMT Right eval Left eval  Hip flexion 3- 3-  Hip extension    Hip abduction    Hip adduction    Hip internal rotation    Hip external rotation    Knee flexion 4+ 4+  Knee extension 4+ 4+  Ankle dorsiflexion 5 5  Ankle plantarflexion    Ankle inversion    Ankle eversion     (Blank rows = not tested)    TODAY'S TREATMENT:                                                                                                                              DATE:   Eval 01/12/23 + HEP practice/discussion +  general discussion of biomechanics given compression fx   Seated TA sets 10x3 seconds Seated TA sets + mini-march x10 B Seated gentle HS stretch 1x30 seconds B     PATIENT EDUCATION:  Education details: exam findings, POC, HEP  Person educated: Patient Education method: Explanation, Demonstration, and Handouts Education comprehension: verbalized understanding, returned demonstration, and needs further education  HOME EXERCISE PROGRAM: Access Code: [email or web address] URL: [email or web address] Date: 01/12/2023 Prepared by: Nedra Hai  Exercises - Standing Transverse Abdominis Contraction  - 3 x daily - 7 x weekly - 1 sets - 10 reps - 3 seconds  hold - Seated Transversus Abdominus Bracing + March  - 3 x daily - 7 x weekly - 1 sets - 10 reps - 1 second hold - Seated Hamstring Stretch  - 1 x daily - 7 x weekly - 1 sets - 3 reps - 30 seconds  hold  ASSESSMENT:  CLINICAL IMPRESSION: Patient is a 67 y.o. F who was seen today for physical therapy evaluation and treatment for  Diagnosis  M54.41,G89.29 (ICD-10-CM) - Chronic right-sided low back pain with right-sided sciatica  M79.651 (ICD-10-CM) - Right thigh pain  M79.10 (ICD-10-CM) - Myalgia  . Exam with objective findings as above. Will benefit from trial  of skilled PT services to address all findings and improve pain and function moving forward. She does have chronic knee pain that is also contributing to presentation, will investigate this area further PRN.   OBJECTIVE IMPAIRMENTS: Abnormal gait, decreased activity tolerance, decreased mobility, difficulty walking, decreased ROM, decreased strength, increased edema, increased fascial restrictions, increased muscle spasms, impaired flexibility, impaired sensation, improper body mechanics, postural dysfunction, obesity, and pain.   ACTIVITY LIMITATIONS: carrying, lifting, bending, sitting, standing, squatting, stairs, transfers, bed mobility, and locomotion level  PARTICIPATION  LIMITATIONS: meal prep, cleaning, laundry, driving, shopping, community activity, yard work, and church  PERSONAL FACTORS: Age, Behavior pattern, Education, Fitness, Past/current experiences, Social background, and Time since onset of injury/illness/exacerbation are also affecting patient's functional outcome.   REHAB POTENTIAL: Fair chronicity of condition  CLINICAL DECISION MAKING: Evolving/moderate complexity  EVALUATION COMPLEXITY: Moderate   GOALS: Goals reviewed with patient? No  SHORT TERM GOALS: Target date: 02/09/2023    Will be compliant with appropriate progressive HEP  Baseline: Goal status: INITIAL  2.  Will demonstrate quality biomechanics for bed mobility and floor to waist lifting mechanics  Baseline:  Goal status: INITIAL  3.  Will demonstrate improved awareness of posture with use of postural aides PRN/as desired  Baseline:  Goal status: INITIAL   LONG TERM GOALS: Target date: 03/09/2023    MMT to improve by at least one grade in all weak groups  Baseline:  Goal status: INITIAL  2.  Cramping symptoms to have improved by at least 50% to improve QOL and sleep  Baseline:  Goal status: INITIAL  3.  Pain to be no more than 3/10 at worst to improve QOL  Baseline:  Goal status: INITIAL  4.  Will be able to perform all functional household and community tasks with pain no more than 3/10 Baseline:  Goal status: INITIAL  5.  Muscle flexibility impairments to have improved by at least 50%  Baseline:  Goal status: INITIAL  6.  FOTO score to be within 5 points of predicted value by time of DC  Baseline:  Goal status: INITIAL  PLAN:  PT FREQUENCY: 1x/week pt request   PT DURATION: 8 weeks  PLANNED INTERVENTIONS: 97164- PT Re-evaluation, 97110-Therapeutic exercises, 97530- Therapeutic activity, [id number]- Neuromuscular re-education, 97535- Self Care, 62952- Manual therapy, [id number]- Gait training, [phone]- Orthotic Fit/training, [id number]- Aquatic Therapy, [id number]-  Ultrasound, [id number]- Ionotophoresis 4mg /ml Dexamethasone, Taping, Dry Needling, Cryotherapy, and Moist heat.  PLAN FOR NEXT SESSION: functional exercise and strengthening/flexibility as tolerated. Does have age-indeterminate compression fracture, getting MRI to clarify but sounds MD thinks it may be chronic. Consider dry needling? Core strength   Nedra Hai, PT, DPT 01/12/23 1:13 PM

## 2023-01-12 NOTE — Telephone Encounter (Signed)
I looked at your CT scan.  You have a medium old compression fracture at L4. I will get a MRI of the lumbar spine. This should tell us about any pinched nerve in your back causing leg pain and we will evaluate that compression fracture to see if its new or old. You should hear soon about scheduling.

## 2023-01-13 NOTE — Telephone Encounter (Signed)
Called pt at [phone] and advised pt per Dr. Denyse Amass. Pt has been contacted and has scheduled MRI for 01/30/23.

## 2023-01-16 LAB — COLOGUARD: COLOGUARD: NEGATIVE

## 2023-01-22 ENCOUNTER — Encounter: Payer: Medicare Other | Admitting: Physical Therapy

## 2023-01-27 ENCOUNTER — Ambulatory Visit: Payer: Medicare Other | Admitting: Physical Therapy

## 2023-01-27 ENCOUNTER — Encounter: Payer: Self-pay | Admitting: Physical Therapy

## 2023-01-27 DIAGNOSIS — M6281 Muscle weakness (generalized): Secondary | ICD-10-CM

## 2023-01-27 DIAGNOSIS — M5459 Other low back pain: Secondary | ICD-10-CM | POA: Diagnosis not present

## 2023-01-27 DIAGNOSIS — M79604 Pain in right leg: Secondary | ICD-10-CM | POA: Diagnosis not present

## 2023-01-27 DIAGNOSIS — R262 Difficulty in walking, not elsewhere classified: Secondary | ICD-10-CM

## 2023-01-27 NOTE — Therapy (Signed)
OUTPATIENT PHYSICAL THERAPY THORACOLUMBAR TREATMENT   Patient Name: Virginia Cabrera MRN: [id number] DOB:[birth year], 67 y.o., female Today's Date: 01/27/2023  END OF SESSION:  PT End of Session - 01/27/23 1214     Visit Number 2    Number of Visits 9    Date for PT Re-Evaluation 03/09/23    Authorization Type [employer] MCR    Authorization Time Period 01/12/23 to 03/09/23    PT Start Time 1215    PT Stop Time 1300    PT Time Calculation (min) 45 min    Activity Tolerance Patient tolerated treatment well    Behavior During Therapy WFL for tasks assessed/performed             Past Medical History:  Diagnosis Date   Allergy    Anal fissure    Anxiety    Arthritis    Asthma    Colon polyp, hyperplastic    Depression    GERD (gastroesophageal reflux disease)    Hypertension    IBS (irritable bowel syndrome)    Vitamin deficiency    Past Surgical History:  Procedure Laterality Date   CESAREAN SECTION     TONSILLECTOMY     Patient Active Problem List   Diagnosis Date Noted   CKD (chronic kidney disease) stage 3, GFR 30-59 ml/min (HCC) 12/31/2022   Osteoarthritis of left knee 10/09/2021   Vitamin D deficiency 10/09/2021   Essential hypertension 03/23/2020   Prolonged Q-T interval on ECG 02/07/2019   Gastroesophageal reflux disease 12/17/2018   Traumatic incomplete tear of right rotator cuff 04/08/2018   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    PCP: Jarold Motto PA-C   REFERRING PROVIDER: Rodolph Bong, MD  REFERRING DIAG:  Diagnosis  M54.41,G89.29 (ICD-10-CM) - Chronic right-sided low back pain with right-sided sciatica  M79.651 (ICD-10-CM) - Right thigh pain  M79.10 (ICD-10-CM) - Myalgia    Rationale for Evaluation and Treatment: Rehabilitation  THERAPY DIAG:  Other low back pain  Pain in right leg  Muscle weakness (generalized)  Difficulty in walking, not elsewhere classified  ONSET DATE: symptoms really got bad within the past month (acute on  chronic)  SUBJECTIVE:                                                                                                                                                                                           SUBJECTIVE STATEMENT: Pt states R knee sore, needs a TKA. States continued cramping or R thigh intermittently.    Having some back pain, they just scheduled an MRI because the other images showed a compression fracture. Its really debilitating when the cramps go  from my thigh up to my hip. They start on my inner knee and goes diagonal across my leg to my hip. My back always hurts, sometimes the pain from my thigh will spread up into the back. I feel like I have to drag my leg due to pain sometimes. At night I get cramps in my lower legs. No injuries or weird movements that started this problem. Took vitamin D in the past, if I do a lot of walking I start hurting, if I have to stand still in one spot my legs start burning. Very frustrated by this pain. When I laugh a lot I get a lot of cramping here (points to diaphragm level), deep breaths have been making that area cramp as well.   PERTINENT HISTORY:  Anxiety, OA, HTN, IBS  PAIN:  Are you having pain? Yes: NPRS scale: 5-6/10 Pain location: right hip going into back Pain description: "hard to describe"  Aggravating factors: standing still, walking too much, sitting in the wrong position, "it just comes out of the blue"  Relieving factors: not sure   PRECAUTIONS: Other: age indeterminate compression fracture, awaiting MRI for clarification, unable to tolerate laying supine   RED FLAGS: None   WEIGHT BEARING RESTRICTIONS: No  FALLS:  Has patient fallen in last 6 months? Yes. Number of falls 1- trip on steps, some FOF   LIVING ENVIRONMENT: Lives with: lives alone Lives in: House/apartment Stairs: 2 STE in front, 4-5 in back Has following equipment at home: None  OCCUPATION: on disability   PLOF: Independent, Independent with  basic ADLs, Independent with gait, and Independent with transfers  PATIENT GOALS: stop the cramps, stop locking up   NEXT MD VISIT: Dr. Denyse Amass 02/16/23 (referring)  OBJECTIVE:  Note: Objective measures were completed at Evaluation unless otherwise noted.  DIAGNOSTIC FINDINGS:  CLINICAL DATA:  Acute nonlocalized abdominal pain.   EXAM: CT ABDOMEN AND PELVIS WITH CONTRAST   TECHNIQUE: Multidetector CT imaging of the abdomen and pelvis was performed using the standard protocol following bolus administration of intravenous contrast.   RADIATION DOSE REDUCTION: This exam was performed according to the departmental dose-optimization program which includes automated exposure control, adjustment of the mA and/or kV according to patient size and/or use of iterative reconstruction technique.   CONTRAST:  ISOVUE-300 IOPAMIDOL (ISOVUE-300) INJECTION 61%   COMPARISON:  None Available.   FINDINGS: Lower chest: No acute abnormality.   Hepatobiliary: No suspicious hepatic lesion. Gallbladder is unremarkable. No biliary ductal dilation.   Pancreas: No pancreatic ductal dilation or evidence of acute inflammation.   Spleen: No splenomegaly.   Adrenals/Urinary Tract: Bilateral adrenal glands appear normal. No hydronephrosis. Kidneys demonstrate symmetric enhancement. Urinary bladder is unremarkable for degree of distension.   Stomach/Bowel: Radiopaque enteric contrast material traverses the splenic flexure. Stomach is unremarkable for degree of distension. No pathologic dilation of small or large bowel. Normal appendix. Scattered colonic diverticulosis without findings of acute diverticulitis.   Vascular/Lymphatic: Aortic atherosclerosis. Smooth IVC contours. The portal, splenic and superior mesenteric veins are patent. No pathologically enlarged abdominal or pelvic lymph nodes.   Reproductive: Uterus and bilateral adnexa are unremarkable.   Other: No significant  abdominopelvic free fluid.   Musculoskeletal: Age indeterminate mild compression deformity of the L4 vertebral body. Multilevel degenerative changes spine.   IMPRESSION: 1. No acute abnormality in the abdomen or pelvis. 2. Scattered colonic diverticulosis without findings of acute diverticulitis. 3. Age indeterminate mild compression deformity of the L4 vertebral body, suggest correlation with tenderness to  palpation to assess acuity. 4.  Aortic Atherosclerosis (ICD10-I70.0).  PATIENT SURVEYS:  FOTO 36 predicted 45 in 12 visits   SCREENING FOR RED FLAGS:  Compression fracture: Yes: age indeterminate    COGNITION: Overall cognitive status: Within functional limits for tasks assessed     SENSATION: Not tested no numbness at rest, can get numbness going down to knees with extended standing   MUSCLE LENGTH:  Quads and hip flexors tight per functional observation, hamstrings moderate limitation B per functional observation   POSTURE: rounded shoulders, forward head, increased lumbar lordosis, and flexed trunk   PALPATION: TTP R lumbar paraspinals, R lateral hip , medial quad R (does have some broken blood vessels per her report)  LUMBAR ROM:   AROM eval  Flexion Moderate limitation; RFIS   Extension Severe limitation, sharp pains deferred REIS   Right lateral flexion Severe limitation   Left lateral flexion Severe limitation   Right rotation   Left rotation    (Blank rows = not tested)   LOWER EXTREMITY MMT:    MMT Right eval Left eval  Hip flexion 3- 3-  Hip extension    Hip abduction    Hip adduction    Hip internal rotation    Hip external rotation    Knee flexion 4+ 4+  Knee extension 4+ 4+  Ankle dorsiflexion 5 5  Ankle plantarflexion    Ankle inversion    Ankle eversion     (Blank rows = not tested)    TODAY'S TREATMENT:                                                                                                                               DATE:   01/27/2023 Therapeutic Exercise: Aerobic: Supine: Hip ER clams x 10 , 5 sec;  SLR 2 x 3 reps on R;  Seated: LAQ 2 x 10;  heel slides 2 x 8;  Standing: ambulation- with spc - education on mechanics and use 45 ft x 8;  Stretches: SKTC 30 sec x 3 bil with towel;  Neuromuscular Re-education: Manual Therapy: light STM to R medial quad. Demo of tennis ball and roller for HEP Therapeutic Activity: Self Care: Modalities: moist heat pack to R thigh/quad x 8 min at end of session/seated.    Eval 01/12/23 + HEP practice/discussion + general discussion of biomechanics given compression fx   Seated TA sets 10x3 seconds Seated TA sets + mini-march x10 B Seated gentle HS stretch 1x30 seconds B     PATIENT EDUCATION:  Education details: updated and reviewed HEP.  Person educated: Patient Education method: Explanation, Demonstration, and Handouts Education comprehension: verbalized understanding, returned demonstration, and needs further education  HOME EXERCISE PROGRAM: Access Code: [email or web address] URL: [email or web address] Date: 01/12/2023 Prepared by: Nedra Hai  Exercises - Standing Transverse Abdominis Contraction  - 3 x daily - 7 x weekly - 1 sets - 10 reps - 3 seconds  hold - Seated Transversus Abdominus  Bracing + March  - 3 x daily - 7 x weekly - 1 sets - 10 reps - 1 second hold - Seated Hamstring Stretch  - 1 x daily - 7 x weekly - 1 sets - 3 reps - 30 seconds  hold  ASSESSMENT:  CLINICAL IMPRESSION: Patient continues to have bothersome cramping in R medial quad. STM and trial/discussion for using heat at home as well for reducing muscle tension. Gait deficits noted, likely stemming from R knee stiffness, instability and pain. Plan to continue to improved R thigh pain, and progress to hip and low back mobility and strength as tolerated. R thigh pain inhibiting ability for back and hip stretches/exercise due to cramping.   OBJECTIVE IMPAIRMENTS: Abnormal gait,  decreased activity tolerance, decreased mobility, difficulty walking, decreased ROM, decreased strength, increased edema, increased fascial restrictions, increased muscle spasms, impaired flexibility, impaired sensation, improper body mechanics, postural dysfunction, obesity, and pain.   ACTIVITY LIMITATIONS: carrying, lifting, bending, sitting, standing, squatting, stairs, transfers, bed mobility, and locomotion level  PARTICIPATION LIMITATIONS: meal prep, cleaning, laundry, driving, shopping, community activity, yard work, and church  PERSONAL FACTORS: Age, Behavior pattern, Education, Fitness, Past/current experiences, Social background, and Time since onset of injury/illness/exacerbation are also affecting patient's functional outcome.   REHAB POTENTIAL: Fair chronicity of condition  CLINICAL DECISION MAKING: Evolving/moderate complexity  EVALUATION COMPLEXITY: Moderate   GOALS: Goals reviewed with patient? No  SHORT TERM GOALS: Target date: 02/09/2023    Will be compliant with appropriate progressive HEP  Baseline: Goal status: INITIAL  2.  Will demonstrate quality biomechanics for bed mobility and floor to waist lifting mechanics  Baseline:  Goal status: INITIAL  3.  Will demonstrate improved awareness of posture with use of postural aides PRN/as desired  Baseline:  Goal status: INITIAL   LONG TERM GOALS: Target date: 03/09/2023    MMT to improve by at least one grade in all weak groups  Baseline:  Goal status: INITIAL  2.  Cramping symptoms to have improved by at least 50% to improve QOL and sleep  Baseline:  Goal status: INITIAL  3.  Pain to be no more than 3/10 at worst to improve QOL  Baseline:  Goal status: INITIAL  4.  Will be able to perform all functional household and community tasks with pain no more than 3/10 Baseline:  Goal status: INITIAL  5.  Muscle flexibility impairments to have improved by at least 50%  Baseline:  Goal status: INITIAL  6.   FOTO score to be within 5 points of predicted value by time of DC  Baseline:  Goal status: INITIAL  PLAN:  PT FREQUENCY: 1x/week pt request   PT DURATION: 8 weeks  PLANNED INTERVENTIONS: 97164- PT Re-evaluation, 97110-Therapeutic exercises, 97530- Therapeutic activity, [id number]- Neuromuscular re-education, 97535- Self Care, [id number]- Manual therapy, [id number]- Gait training, [phone]- Orthotic Fit/training, [id number]- Aquatic Therapy, [id number]- Ultrasound, [id number]- Ionotophoresis 4mg /ml Dexamethasone, Taping, Dry Needling, Cryotherapy, and Moist heat.  PLAN FOR NEXT SESSION: functional exercise and strengthening/flexibility as tolerated. Does have age-indeterminate compression fracture, getting MRI to clarify but sounds MD thinks it may be chronic. Consider dry needling? Core strength     Sedalia Muta, PT, DPT 12:14 PM  01/27/23

## 2023-01-29 NOTE — Progress Notes (Signed)
Low back x-ray shows a possible compression fracture at L4.  MRI is scheduled for tomorrow.  This should help answer questions.

## 2023-01-30 ENCOUNTER — Ambulatory Visit
Admission: RE | Admit: 2023-01-30 | Discharge: 2023-01-30 | Disposition: A | Discharge status: Home / Self Care | Payer: Medicare Other | Source: Ambulatory Visit | Attending: Family Medicine | Admitting: Family Medicine

## 2023-01-30 DIAGNOSIS — M791 Myalgia, unspecified site: Secondary | ICD-10-CM

## 2023-01-30 DIAGNOSIS — G8929 Other chronic pain: Secondary | ICD-10-CM

## 2023-01-30 DIAGNOSIS — M79651 Pain in right thigh: Secondary | ICD-10-CM

## 2023-01-30 DIAGNOSIS — S32040A Wedge compression fracture of fourth lumbar vertebra, initial encounter for closed fracture: Secondary | ICD-10-CM

## 2023-02-03 ENCOUNTER — Encounter: Payer: Self-pay | Admitting: Physical Therapy

## 2023-02-03 ENCOUNTER — Ambulatory Visit: Payer: Medicare Other | Admitting: Physical Therapy

## 2023-02-03 DIAGNOSIS — R262 Difficulty in walking, not elsewhere classified: Secondary | ICD-10-CM

## 2023-02-03 DIAGNOSIS — M79604 Pain in right leg: Secondary | ICD-10-CM | POA: Diagnosis not present

## 2023-02-03 DIAGNOSIS — M5459 Other low back pain: Secondary | ICD-10-CM | POA: Diagnosis not present

## 2023-02-03 DIAGNOSIS — M6281 Muscle weakness (generalized): Secondary | ICD-10-CM

## 2023-02-03 NOTE — Therapy (Unsigned)
OUTPATIENT PHYSICAL THERAPY THORACOLUMBAR TREATMENT   Patient Name: Virginia Cabrera MRN: [id number] DOB:07-Oct-1955, 67 y.o., female Today's Date: 02/03/2023  END OF SESSION:  PT End of Session - 02/03/23 1208     Visit Number 3    Number of Visits 9    Date for PT Re-Evaluation 03/09/23    Authorization Type [employer] MCR    Authorization Time Period 01/12/23 to 03/09/23    PT Start Time 1215    PT Stop Time 1305    PT Time Calculation (min) 50 min    Activity Tolerance Patient tolerated treatment well    Behavior During Therapy WFL for tasks assessed/performed             Past Medical History:  Diagnosis Date   Allergy    Anal fissure    Anxiety    Arthritis    Asthma    Colon polyp, hyperplastic    Depression    GERD (gastroesophageal reflux disease)    Hypertension    IBS (irritable bowel syndrome)    Vitamin deficiency    Past Surgical History:  Procedure Laterality Date   CESAREAN SECTION     TONSILLECTOMY     Patient Active Problem List   Diagnosis Date Noted   CKD (chronic kidney disease) stage 3, GFR 30-59 ml/min (HCC) 12/31/2022   Osteoarthritis of left knee 10/09/2021   Vitamin D deficiency 10/09/2021   Essential hypertension 03/23/2020   Prolonged Q-T interval on ECG 02/07/2019   Gastroesophageal reflux disease 12/17/2018   Traumatic incomplete tear of right rotator cuff 04/08/2018   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    PCP: Jarold Motto PA-C   REFERRING PROVIDER: Rodolph Bong, MD  REFERRING DIAG:  Diagnosis  M54.41,G89.29 (ICD-10-CM) - Chronic right-sided low back pain with right-sided sciatica  M79.651 (ICD-10-CM) - Right thigh pain  M79.10 (ICD-10-CM) - Myalgia    Rationale for Evaluation and Treatment: Rehabilitation  THERAPY DIAG:  Other low back pain  Pain in right leg  Muscle weakness (generalized)  Difficulty in walking, not elsewhere classified  ONSET DATE: symptoms really got bad within the past month (acute on  chronic)  SUBJECTIVE:                                                                                                                                                                                           SUBJECTIVE STATEMENT: Pt states R medial thigh still sore, but has not had any cramping. Has been doing HEP. Thinks heat helps. R hip and back still sore.    Having some back pain, they just scheduled an MRI because the other images  showed a compression fracture. Its really debilitating when the cramps go from my thigh up to my hip. They start on my inner knee and goes diagonal across my leg to my hip. My back always hurts, sometimes the pain from my thigh will spread up into the back. I feel like I have to drag my leg due to pain sometimes. At night I get cramps in my lower legs. No injuries or weird movements that started this problem. Took vitamin D in the past, if I do a lot of walking I start hurting, if I have to stand still in one spot my legs start burning. Very frustrated by this pain. When I laugh a lot I get a lot of cramping here (points to diaphragm level), deep breaths have been making that area cramp as well.   PERTINENT HISTORY:  Anxiety, OA, HTN, IBS  PAIN:  Are you having pain? Yes: NPRS scale: 5-6/10 Pain location: right hip going into back Pain description: "hard to describe"  Aggravating factors: standing still, walking too much, sitting in the wrong position, "it just comes out of the blue"  Relieving factors: not sure   PRECAUTIONS: Other: age indeterminate compression fracture, awaiting MRI for clarification, unable to tolerate laying supine   RED FLAGS: None   WEIGHT BEARING RESTRICTIONS: No  FALLS:  Has patient fallen in last 6 months? Yes. Number of falls 1- trip on steps, some FOF   LIVING ENVIRONMENT: Lives with: lives alone Lives in: House/apartment Stairs: 2 STE in front, 4-5 in back Has following equipment at home: None  OCCUPATION: on  disability   PLOF: Independent, Independent with basic ADLs, Independent with gait, and Independent with transfers  PATIENT GOALS: stop the cramps, stop locking up   NEXT MD VISIT: Dr. Denyse Amass 02/16/23 (referring)  OBJECTIVE:  Note: Objective measures were completed at Evaluation unless otherwise noted.  DIAGNOSTIC FINDINGS:  CLINICAL DATA:  Acute nonlocalized abdominal pain.   EXAM: CT ABDOMEN AND PELVIS WITH CONTRAST   TECHNIQUE: Multidetector CT imaging of the abdomen and pelvis was performed using the standard protocol following bolus administration of intravenous contrast.   RADIATION DOSE REDUCTION: This exam was performed according to the departmental dose-optimization program which includes automated exposure control, adjustment of the mA and/or kV according to patient size and/or use of iterative reconstruction technique.   CONTRAST:  ISOVUE-300 IOPAMIDOL (ISOVUE-300) INJECTION 61%   COMPARISON:  None Available.   FINDINGS: Lower chest: No acute abnormality.   Hepatobiliary: No suspicious hepatic lesion. Gallbladder is unremarkable. No biliary ductal dilation.   Pancreas: No pancreatic ductal dilation or evidence of acute inflammation.   Spleen: No splenomegaly.   Adrenals/Urinary Tract: Bilateral adrenal glands appear normal. No hydronephrosis. Kidneys demonstrate symmetric enhancement. Urinary bladder is unremarkable for degree of distension.   Stomach/Bowel: Radiopaque enteric contrast material traverses the splenic flexure. Stomach is unremarkable for degree of distension. No pathologic dilation of small or large bowel. Normal appendix. Scattered colonic diverticulosis without findings of acute diverticulitis.   Vascular/Lymphatic: Aortic atherosclerosis. Smooth IVC contours. The portal, splenic and superior mesenteric veins are patent. No pathologically enlarged abdominal or pelvic lymph nodes.   Reproductive: Uterus and bilateral adnexa are  unremarkable.   Other: No significant abdominopelvic free fluid.   Musculoskeletal: Age indeterminate mild compression deformity of the L4 vertebral body. Multilevel degenerative changes spine.   IMPRESSION: 1. No acute abnormality in the abdomen or pelvis. 2. Scattered colonic diverticulosis without findings of acute diverticulitis. 3. Age indeterminate mild compression  deformity of the L4 vertebral body, suggest correlation with tenderness to palpation to assess acuity. 4.  Aortic Atherosclerosis (ICD10-I70.0).  PATIENT SURVEYS:  FOTO 36 predicted 45 in 12 visits   SCREENING FOR RED FLAGS:  Compression fracture: Yes: age indeterminate    COGNITION: Overall cognitive status: Within functional limits for tasks assessed     SENSATION: Not tested no numbness at rest, can get numbness going down to knees with extended standing   MUSCLE LENGTH:  Quads and hip flexors tight per functional observation, hamstrings moderate limitation B per functional observation   POSTURE: rounded shoulders, forward head, increased lumbar lordosis, and flexed trunk   PALPATION: TTP R lumbar paraspinals, R lateral hip , medial quad R (does have some broken blood vessels per her report)  LUMBAR ROM:   AROM eval  Flexion Moderate limitation; RFIS   Extension Severe limitation, sharp pains deferred REIS   Right lateral flexion Severe limitation   Left lateral flexion Severe limitation   Right rotation   Left rotation    (Blank rows = not tested)   LOWER EXTREMITY MMT:    MMT Right eval Left eval  Hip flexion 3- 3-  Hip extension    Hip abduction    Hip adduction    Hip internal rotation    Hip external rotation    Knee flexion 4+ 4+  Knee extension 4+ 4+  Ankle dorsiflexion 5 5  Ankle plantarflexion    Ankle inversion    Ankle eversion     (Blank rows = not tested)    TODAY'S TREATMENT:                                                                                                                               DATE:   02/03/2023 Therapeutic Exercise: Aerobic: Supine: Hip ER clams x 10 , 5 sec;  SLR 2 x 5  bil;  Pelvic tilts x 15;  Seated:  LAQ 2.5lb  2 x 10;  Standing:  Stretches: SKTC 30 sec x 3 bil with towel;  piriformis/modified 30 sec x 2 bil;  Neuromuscular Re-education: Manual Therapy: light STM/roller to R medial quad;   herapeutic Activity: Self Care: Modalities: moist heat pack to R thigh/quad x 10 min at end of session/seated.    Therapeutic Exercise: Aerobic: Supine: Hip ER clams x 10 , 5 sec;  SLR 2 x 3 reps on R;  Seated: LAQ 2 x 10;  heel slides 2 x 8;  Standing: ambulation- with spc - education on mechanics and use 45 ft x 8;  Stretches: SKTC 30 sec x 3 bil with towel;  Neuromuscular Re-education: Manual Therapy: light STM to R medial quad. Demo of tennis ball and roller for HEP Therapeutic Activity: Self Care: Modalities: moist heat pack to R thigh/quad x 8 min at end of session/seated.    Eval 01/12/23 + HEP practice/discussion + general discussion of biomechanics given compression fx   Seated  TA sets 10x3 seconds Seated TA sets + mini-march x10 B Seated gentle HS stretch 1x30 seconds B     PATIENT EDUCATION:  Education details: updated and reviewed HEP.  Person educated: Patient Education method: Explanation, Demonstration, and Handouts Education comprehension: verbalized understanding, returned demonstration, and needs further education  HOME EXERCISE PROGRAM: Access Code: [email or web address] URL: [email or web address] Date: 01/12/2023 Prepared by: Nedra Hai  Exercises - Standing Transverse Abdominis Contraction  - 3 x daily - 7 x weekly - 1 sets - 10 reps - 3 seconds  hold - Seated Transversus Abdominus Bracing + March  - 3 x daily - 7 x weekly - 1 sets - 10 reps - 1 second hold - Seated Hamstring Stretch  - 1 x daily - 7 x weekly - 1 sets - 3 reps - 30 seconds  hold  ASSESSMENT:  CLINICAL  IMPRESSION: Pt with improved ability for ther ex today, without cramping of R thigh. She still has tenderness there, but improved mobility of hip. Education and updated HEP for lumbar mobility. Will continue back/hip mobility and progressive strengthening of core and hips as tolerated.   OBJECTIVE IMPAIRMENTS: Abnormal gait, decreased activity tolerance, decreased mobility, difficulty walking, decreased ROM, decreased strength, increased edema, increased fascial restrictions, increased muscle spasms, impaired flexibility, impaired sensation, improper body mechanics, postural dysfunction, obesity, and pain.   ACTIVITY LIMITATIONS: carrying, lifting, bending, sitting, standing, squatting, stairs, transfers, bed mobility, and locomotion level  PARTICIPATION LIMITATIONS: meal prep, cleaning, laundry, driving, shopping, community activity, yard work, and church  PERSONAL FACTORS: Age, Behavior pattern, Education, Fitness, Past/current experiences, Social background, and Time since onset of injury/illness/exacerbation are also affecting patient's functional outcome.   REHAB POTENTIAL: Fair chronicity of condition  CLINICAL DECISION MAKING: Evolving/moderate complexity  EVALUATION COMPLEXITY: Moderate   GOALS: Goals reviewed with patient? No  SHORT TERM GOALS: Target date: 02/09/2023    Will be compliant with appropriate progressive HEP  Baseline: Goal status: INITIAL  2.  Will demonstrate quality biomechanics for bed mobility and floor to waist lifting mechanics  Baseline:  Goal status: INITIAL  3.  Will demonstrate improved awareness of posture with use of postural aides PRN/as desired  Baseline:  Goal status: INITIAL   LONG TERM GOALS: Target date: 03/09/2023    MMT to improve by at least one grade in all weak groups  Baseline:  Goal status: INITIAL  2.  Cramping symptoms to have improved by at least 50% to improve QOL and sleep  Baseline:  Goal status: INITIAL  3.  Pain to  be no more than 3/10 at worst to improve QOL  Baseline:  Goal status: INITIAL  4.  Will be able to perform all functional household and community tasks with pain no more than 3/10 Baseline:  Goal status: INITIAL  5.  Muscle flexibility impairments to have improved by at least 50%  Baseline:  Goal status: INITIAL  6.  FOTO score to be within 5 points of predicted value by time of DC  Baseline:  Goal status: INITIAL  PLAN:  PT FREQUENCY: 1x/week pt request   PT DURATION: 8 weeks  PLANNED INTERVENTIONS: [id number]- PT Re-evaluation, 97110-Therapeutic exercises, 97530- Therapeutic activity, [id number]- Neuromuscular re-education, 97535- Self Care, 47829- Manual therapy, [id number]- Gait training, [phone]- Orthotic Fit/training, [id number]- Aquatic Therapy, [id number]- Ultrasound, [id number]- Ionotophoresis 4mg /ml Dexamethasone, Taping, Dry Needling, Cryotherapy, and Moist heat.  PLAN FOR NEXT SESSION: functional exercise and strengthening/flexibility as tolerated. Does have age-indeterminate compression fracture, getting MRI to clarify but sounds MD thinks it may  be chronic. Consider dry needling? Core strength     Sedalia Muta, PT, DPT 9:21 AM  02/04/23

## 2023-02-04 ENCOUNTER — Encounter: Payer: Self-pay | Admitting: Physical Therapy

## 2023-02-09 NOTE — Progress Notes (Signed)
Low back MRI shows an old compression fracture at L4.  This looks like it happened a long time ago.  You do have areas of arthritis and is some areas that could cause some pinched nerves.  Recommend return to clinic to go over the results in full detail and talk about what we are going to do.

## 2023-02-12 ENCOUNTER — Encounter: Payer: Medicare Other | Admitting: Physical Therapy

## 2023-02-16 ENCOUNTER — Ambulatory Visit: Payer: Medicare Other | Admitting: Family Medicine

## 2023-02-16 NOTE — Progress Notes (Unsigned)
   Rubin Payor, PhD, LAT, ATC acting as a scribe for Clementeen Graham, MD.  MASHA SESTER is a 67 y.o. female who presents to [facility] at [facility] today for f/u R thigh and low back pain w/ MRI review. Pt was last seen by Dr. Denyse Amass on 01/05/23 and was referred to PT, completing 3 visits.  Today, pt reports ***  Dx testing: 01/30/23 L-spine MRI  01/05/23 L-spine XR 01/05/23 Labs  Pertinent review of systems: ***  Relevant historical information: ***   Exam:  There were no vitals taken for this visit. General: Well Developed, well nourished, and in no acute distress.   MSK: ***    Lab and Radiology Results No results found for this or any previous visit (from the past 72 hour(s)). No results found.     Assessment and Plan: 67 y.o. female with ***   PDMP not reviewed this encounter. No orders of the defined types were placed in this encounter.  No orders of the defined types were placed in this encounter.    Discussed warning signs or symptoms. Please see discharge instructions. Patient expresses understanding.   ***

## 2023-02-17 ENCOUNTER — Encounter: Payer: Self-pay | Admitting: Family Medicine

## 2023-02-17 ENCOUNTER — Ambulatory Visit (INDEPENDENT_AMBULATORY_CARE_PROVIDER_SITE_OTHER): Payer: Medicare Other | Admitting: Family Medicine

## 2023-02-17 VITALS — BP 124/84 | HR 60 | Ht 63.5 in | Wt 215.0 lb

## 2023-02-17 DIAGNOSIS — M5441 Lumbago with sciatica, right side: Secondary | ICD-10-CM | POA: Diagnosis not present

## 2023-02-17 DIAGNOSIS — G8929 Other chronic pain: Secondary | ICD-10-CM | POA: Diagnosis not present

## 2023-02-17 DIAGNOSIS — M79651 Pain in right thigh: Secondary | ICD-10-CM

## 2023-02-17 DIAGNOSIS — S32040S Wedge compression fracture of fourth lumbar vertebra, sequela: Secondary | ICD-10-CM | POA: Diagnosis not present

## 2023-02-17 NOTE — Patient Instructions (Signed)
Thank you for coming in today.    

## 2023-02-17 NOTE — Progress Notes (Signed)
I, Stevenson Clinch, CMA acting as a scribe for Clementeen Graham, MD.  Virginia Cabrera is a 67 y.o. female who presents to [facility] at [facility] today for f/u R thigh and low back pain w/ MRI review. Pt was last seen by Dr. Denyse Amass on 01/05/23 and was referred to PT, completing 3 visits.  Today, pt reports some improvement of sx with PT, was unable to attempt last week d/t illness. Notes cramping in the left calf last night.   X-ray obtained at the time of the last visit in October showed a age-indeterminate compression fracture at L4.  This was subsequently investigated with an MRI showing an old compression fracture and some potential nerve impingement.  She is here today to talk about all of this.   Her primary complaint today is right lateral hip pain improving with physical therapy. Dx testing: 01/30/23 L-spine MRI  01/05/23 L-spine XR 01/05/23 Labs  Pertinent review of systems: No fevers or chills  Relevant historical information: Hypertension.  CKD 3.   Exam:  BP 124/84   Pulse 60   Ht 5' 3.5" (1.613 m)   Wt 215 lb (97.5 kg)   SpO2 98%   BMI 37.49 kg/m  General: Well Developed, well nourished, and in no acute distress.   MSK: L-spine nontender to palpation midline.  Right hip tender palpation right greater trochanter.    Lab and Radiology Results  EXAM: MRI LUMBAR SPINE WITHOUT CONTRAST   TECHNIQUE: Multiplanar, multisequence MR imaging of the lumbar spine was performed. No intravenous contrast was administered.   COMPARISON:  CT scan 01/06/2023 and radiographs 01/05/2023   FINDINGS: Segmentation: The lowest lumbar type non-rib-bearing vertebra is labeled as L5.   Alignment:  No vertebral subluxation is observed.   Vertebrae: Remote 50% superior endplate compression fracture at L4. No significant vertebral marrow edema is identified. Disc desiccation noted at L3-4, L4-5, and L5-S1.   Conus medullaris and cauda equina: Conus extends to  the L1-2 level. Conus and cauda equina appear normal.   Paraspinal and other soft tissues: Unremarkable   Disc levels:   T12-L1: Unremarkable   L1-2: Unremarkable   L2-3: No impingement.  Mild disc bulge.   L3-4: No impingement.  Diffuse disc bulge.   L4-5: Borderline left foraminal stenosis due to facet arthropathy and mild disc bulge.   L5-S1: Mild right foraminal stenosis due to intervertebral and facet spurring. Mild disc bulge.   IMPRESSION: 1. Lumbar spondylosis and degenerative disc disease, causing mild impingement at L5-S1 and borderline impingement at L4-5. 2. Remote 50% superior endplate compression fracture at L4.     Electronically Signed   By: Gaylyn Rong M.D.   On: 02/05/2023 19:36. I, Clementeen Graham, personally (independently) visualized and performed the interpretation of the images attached in this note.       Assessment and Plan: 67 y.o. female with right lateral hip pain due to trochanteric bursitis.  Improving with PT.  The obvious next step for this problem would be a steroid injection.  She generally is reluctant to consider steroid injection as she has had 1 in her knee in the past that did not help.  She fundamentally does not believe in them.  If she cannot get good enough with PT next step may be trial of cortisone shot in that right lateral hip greater trochanter bursa area.  Fortunately the MRI showed that the compression fracture in her lumbar spine is old and does not need special attention as  she is not having pain in this region.  She may have some radicular pain down her legs but that is minor compared to the lateral hip pain.  Watchful waiting for L-spine related issues.   PDMP not reviewed this encounter. No orders of the defined types were placed in this encounter.  No orders of the defined types were placed in this encounter.    Discussed warning signs or symptoms. Please see discharge instructions. Patient expresses  understanding.   The above documentation has been reviewed and is accurate and complete Clementeen Graham, M.D. Total encounter time 30 minutes including face-to-face time with the patient and, reviewing past medical record, and charting on the date of service.

## 2023-02-19 ENCOUNTER — Encounter: Payer: Self-pay | Admitting: Family Medicine

## 2023-02-26 ENCOUNTER — Encounter: Payer: Self-pay | Admitting: Physical Therapy

## 2023-02-26 ENCOUNTER — Ambulatory Visit: Payer: Medicare Other | Admitting: Physical Therapy

## 2023-02-26 DIAGNOSIS — M79604 Pain in right leg: Secondary | ICD-10-CM | POA: Diagnosis not present

## 2023-02-26 DIAGNOSIS — M6281 Muscle weakness (generalized): Secondary | ICD-10-CM | POA: Diagnosis not present

## 2023-02-26 DIAGNOSIS — M5459 Other low back pain: Secondary | ICD-10-CM

## 2023-02-26 NOTE — Therapy (Signed)
OUTPATIENT PHYSICAL THERAPY THORACOLUMBAR TREATMENT   Patient Name: Virginia Cabrera MRN: [id number] DOB:29-Jul-1955, 67 y.o., female Today's Date: 02/26/2023  END OF SESSION:  PT End of Session - 02/26/23 1012     Visit Number 4    Number of Visits 9    Date for PT Re-Evaluation 03/09/23    Authorization Type [employer] MCR    Authorization Time Period 01/12/23 to 03/09/23    PT Start Time 1015    PT Stop Time 1100    PT Time Calculation (min) 45 min    Activity Tolerance Patient tolerated treatment well    Behavior During Therapy WFL for tasks assessed/performed             Past Medical History:  Diagnosis Date   Allergy    Anal fissure    Anxiety    Arthritis    Asthma    Colon polyp, hyperplastic    Depression    GERD (gastroesophageal reflux disease)    Hypertension    IBS (irritable bowel syndrome)    Vitamin deficiency    Past Surgical History:  Procedure Laterality Date   CESAREAN SECTION     TONSILLECTOMY     Patient Active Problem List   Diagnosis Date Noted   CKD (chronic kidney disease) stage 3, GFR 30-59 ml/min (HCC) 12/31/2022   Osteoarthritis of left knee 10/09/2021   Vitamin D deficiency 10/09/2021   Essential hypertension 03/23/2020   Prolonged Q-T interval on ECG 02/07/2019   Gastroesophageal reflux disease 12/17/2018   Traumatic incomplete tear of right rotator cuff 04/08/2018   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    PCP: Jarold Motto PA-C   REFERRING PROVIDER: Rodolph Bong, MD  REFERRING DIAG:  Diagnosis  M54.41,G89.29 (ICD-10-CM) - Chronic right-sided low back pain with right-sided sciatica  M79.651 (ICD-10-CM) - Right thigh pain  M79.10 (ICD-10-CM) - Myalgia    Rationale for Evaluation and Treatment: Rehabilitation  THERAPY DIAG:  Other low back pain  Pain in right leg  Muscle weakness (generalized)  ONSET DATE: symptoms really got bad within the past month (acute on chronic)  SUBJECTIVE:                                                                                                                                                                                            SUBJECTIVE STATEMENT: Pt states back is still sore. R hip still sore., up to 6/10. Hip feels "bruised". R thigh still sore, but better, and not cramping as often.   Having some back pain, they just scheduled an MRI because the other images showed a compression fracture. Its really debilitating  when the cramps go from my thigh up to my hip. They start on my inner knee and goes diagonal across my leg to my hip. My back always hurts, sometimes the pain from my thigh will spread up into the back. I feel like I have to drag my leg due to pain sometimes. At night I get cramps in my lower legs. No injuries or weird movements that started this problem. Took vitamin D in the past, if I do a lot of walking I start hurting, if I have to stand still in one spot my legs start burning. Very frustrated by this pain. When I laugh a lot I get a lot of cramping here (points to diaphragm level), deep breaths have been making that area cramp as well.   PERTINENT HISTORY:  Anxiety, OA, HTN, IBS  PAIN:  Are you having pain? Yes: NPRS scale: up to 6/10 Pain location: right hip going into back Pain description: "hard to describe"  Aggravating factors: standing still, walking too much, sitting in the wrong position, "it just comes out of the blue"  Relieving factors: not sure   PRECAUTIONS: Other: age indeterminate compression fracture, awaiting MRI for clarification, unable to tolerate laying supine   RED FLAGS: None   WEIGHT BEARING RESTRICTIONS: No  FALLS:  Has patient fallen in last 6 months? Yes. Number of falls 1- trip on steps, some FOF   LIVING ENVIRONMENT: Lives with: lives alone Lives in: House/apartment Stairs: 2 STE in front, 4-5 in back Has following equipment at home: None  OCCUPATION: on disability   PLOF: Independent, Independent with basic ADLs,  Independent with gait, and Independent with transfers  PATIENT GOALS: stop the cramps, stop locking up   NEXT MD VISIT: Dr. Denyse Amass 02/16/23 (referring)  OBJECTIVE:  Note: Objective measures were completed at Evaluation unless otherwise noted.  DIAGNOSTIC FINDINGS:  CLINICAL DATA:  Acute nonlocalized abdominal pain.   EXAM: CT ABDOMEN AND PELVIS WITH CONTRAST   TECHNIQUE: Multidetector CT imaging of the abdomen and pelvis was performed using the standard protocol following bolus administration of intravenous contrast.   RADIATION DOSE REDUCTION: This exam was performed according to the departmental dose-optimization program which includes automated exposure control, adjustment of the mA and/or kV according to patient size and/or use of iterative reconstruction technique.   CONTRAST:  ISOVUE-300 IOPAMIDOL (ISOVUE-300) INJECTION 61%   COMPARISON:  None Available.   FINDINGS: Lower chest: No acute abnormality.   Hepatobiliary: No suspicious hepatic lesion. Gallbladder is unremarkable. No biliary ductal dilation.   Pancreas: No pancreatic ductal dilation or evidence of acute inflammation.   Spleen: No splenomegaly.   Adrenals/Urinary Tract: Bilateral adrenal glands appear normal. No hydronephrosis. Kidneys demonstrate symmetric enhancement. Urinary bladder is unremarkable for degree of distension.   Stomach/Bowel: Radiopaque enteric contrast material traverses the splenic flexure. Stomach is unremarkable for degree of distension. No pathologic dilation of small or large bowel. Normal appendix. Scattered colonic diverticulosis without findings of acute diverticulitis.   Vascular/Lymphatic: Aortic atherosclerosis. Smooth IVC contours. The portal, splenic and superior mesenteric veins are patent. No pathologically enlarged abdominal or pelvic lymph nodes.   Reproductive: Uterus and bilateral adnexa are unremarkable.   Other: No significant abdominopelvic free  fluid.   Musculoskeletal: Age indeterminate mild compression deformity of the L4 vertebral body. Multilevel degenerative changes spine.   IMPRESSION: 1. No acute abnormality in the abdomen or pelvis. 2. Scattered colonic diverticulosis without findings of acute diverticulitis. 3. Age indeterminate mild compression deformity of the L4 vertebral  body, suggest correlation with tenderness to palpation to assess acuity. 4.  Aortic Atherosclerosis (ICD10-I70.0).  PATIENT SURVEYS:  FOTO 36 predicted 45 in 12 visits   SCREENING FOR RED FLAGS:  Compression fracture: Yes: age indeterminate    COGNITION: Overall cognitive status: Within functional limits for tasks assessed     SENSATION: Not tested no numbness at rest, can get numbness going down to knees with extended standing   MUSCLE LENGTH:  Quads and hip flexors tight per functional observation, hamstrings moderate limitation B per functional observation   POSTURE: rounded shoulders, forward head, increased lumbar lordosis, and flexed trunk   PALPATION: TTP R lumbar paraspinals, R lateral hip , medial quad R (does have some broken blood vessels per her report)  LUMBAR ROM:   AROM eval  Flexion Moderate limitation; RFIS   Extension Severe limitation, sharp pains deferred REIS   Right lateral flexion Severe limitation   Left lateral flexion Severe limitation   Right rotation   Left rotation    (Blank rows = not tested)   LOWER EXTREMITY MMT:    MMT Right eval Left eval  Hip flexion 3- 3-  Hip extension    Hip abduction    Hip adduction    Hip internal rotation    Hip external rotation    Knee flexion 4+ 4+  Knee extension 4+ 4+  Ankle dorsiflexion 5 5  Ankle plantarflexion    Ankle inversion    Ankle eversion     (Blank rows = not tested)    TODAY'S TREATMENT:                                                                                                                              DATE:    02/26/23 Therapeutic Exercise: Aerobic: Supine: Hip ER clams x 10 , 5 sec;  SLR 2 x 5  bil;  Pelvic tilts x 15;  Seated:  LAQ 2.5lb  2 x 10;  S/L: hip abd 2 x 10 on R  Standing:  Stretches:  piriformis/modified 30 sec x 2 bil; LTR x 10;  Neuromuscular Re-education: Manual Therapy:  herapeutic Activity: Self Care: Modalities:    02/03/2023 Therapeutic Exercise: Aerobic: Supine: Hip ER clams x 10 , 5 sec;  SLR 2 x 5  bil;  Pelvic tilts x 15;  Seated:  LAQ 2.5lb  2 x 10;  Standing:  Stretches: SKTC 30 sec x 3 bil with towel;  piriformis/modified 30 sec x 2 bil;  Neuromuscular Re-education: Manual Therapy: light STM/roller to R medial quad;   herapeutic Activity: Self Care: Modalities: moist heat pack to R thigh/quad x 10 min at end of session/seated.    Therapeutic Exercise: Aerobic: Supine: Hip ER clams x 10 , 5 sec;  SLR 2 x 3 reps on R;  Seated: LAQ 2 x 10;  heel slides 2 x 8;  Standing: ambulation- with spc - education on mechanics and use 45 ft x 8;  Stretches: SKTC 30 sec x 3 bil with towel;  Neuromuscular Re-education: Manual Therapy: light STM to R medial quad. Demo of tennis ball and roller for HEP Therapeutic Activity: Self Care: Modalities: moist heat pack to R thigh/quad x 8 min at end of session/seated.     PATIENT EDUCATION:  Education details: updated and reviewed HEP.  Person educated: Patient Education method: Explanation, Demonstration, and Handouts Education comprehension: verbalized understanding, returned demonstration, and needs further education  HOME EXERCISE PROGRAM: Access Code: [email or web address]    ASSESSMENT:  CLINICAL IMPRESSION: Updated and reviewed HEP today. Pt continues to have pain in back and hip. Thigh pain still present but not as painful and cramping lessening. She as able to perform low back and hip ther ex today without pain in thigh which is an improvement. Discussed increased frequency of PT for best outcome. Pt will benefit  from progressive strengthening for R hip, core, and pain relief for back and hip.      OBJECTIVE IMPAIRMENTS: Abnormal gait, decreased activity tolerance, decreased mobility, difficulty walking, decreased ROM, decreased strength, increased edema, increased fascial restrictions, increased muscle spasms, impaired flexibility, impaired sensation, improper body mechanics, postural dysfunction, obesity, and pain.   ACTIVITY LIMITATIONS: carrying, lifting, bending, sitting, standing, squatting, stairs, transfers, bed mobility, and locomotion level  PARTICIPATION LIMITATIONS: meal prep, cleaning, laundry, driving, shopping, community activity, yard work, and church  PERSONAL FACTORS: Age, Behavior pattern, Education, Fitness, Past/current experiences, Social background, and Time since onset of injury/illness/exacerbation are also affecting patient's functional outcome.   REHAB POTENTIAL: Fair chronicity of condition  CLINICAL DECISION MAKING: Evolving/moderate complexity  EVALUATION COMPLEXITY: Moderate   GOALS: Goals reviewed with patient? No  SHORT TERM GOALS: Target date: 02/09/2023    Will be compliant with appropriate progressive HEP   Goal status: MET  2.  Will demonstrate quality biomechanics for bed mobility and floor to waist lifting mechanics   Goal status: ongoing   3.  Will demonstrate improved awareness of posture with use of postural aides PRN/as desired   Goal status: ongoing    LONG TERM GOALS: Target date: 03/09/2023    MMT to improve by at least one grade in all weak groups  Baseline:  Goal status: ongoing   2.  Cramping symptoms to have improved by at least 50% to improve QOL and sleep  Baseline:  Goal status: ongoing   3.  Pain to be no more than 3/10 at worst to improve QOL  Baseline:  Goal status: ongoing   4.  Will be able to perform all functional household and community tasks with pain no more than 3/10 Baseline:  Goal status: ongoing   5.   Muscle flexibility impairments to have improved by at least 50%  Baseline:  Goal status: ongoing   6.  FOTO score to be within 5 points of predicted value by time of DC  Baseline:  Goal status: ongoing   PLAN:  PT FREQUENCY: 1x/week pt request   PT DURATION: 8 weeks  PLANNED INTERVENTIONS: [id number]- PT Re-evaluation, 97110-Therapeutic exercises, 97530- Therapeutic activity, [id number]- Neuromuscular re-education, 97535- Self Care, [id number]- Manual therapy, [id number]- Gait training, [phone]- Orthotic Fit/training, [id number]- Aquatic Therapy, [id number]- Ultrasound, [id number]- Ionotophoresis 4mg /ml Dexamethasone, Taping, Dry Needling, Cryotherapy, and Moist heat.  PLAN FOR NEXT SESSION: functional exercise and strengthening/flexibility as tolerated. Does have age-indeterminate compression fracture, getting MRI to clarify but sounds MD thinks it may be chronic. Consider dry needling? Core strength     Sedalia Muta, PT, DPT 10:12 AM  02/26/23

## 2023-03-01 ENCOUNTER — Encounter: Payer: Self-pay | Admitting: Physical Therapy

## 2023-03-03 ENCOUNTER — Encounter: Payer: Medicare Other | Admitting: Physical Therapy

## 2023-03-12 ENCOUNTER — Ambulatory Visit: Payer: Medicare Other | Admitting: Physical Therapy

## 2023-03-12 ENCOUNTER — Encounter: Payer: Self-pay | Admitting: Physical Therapy

## 2023-03-12 DIAGNOSIS — R262 Difficulty in walking, not elsewhere classified: Secondary | ICD-10-CM | POA: Diagnosis not present

## 2023-03-12 DIAGNOSIS — M5459 Other low back pain: Secondary | ICD-10-CM

## 2023-03-12 DIAGNOSIS — M6281 Muscle weakness (generalized): Secondary | ICD-10-CM

## 2023-03-12 DIAGNOSIS — M79604 Pain in right leg: Secondary | ICD-10-CM | POA: Diagnosis not present

## 2023-03-12 NOTE — Therapy (Signed)
 OUTPATIENT PHYSICAL THERAPY THORACOLUMBAR TREATMENT/Re-Cert    Patient Name: JAMYLA ARD MRN: [id number] DOB:[birth year], 68 y.o., female Today's Date: 03/12/2023   END OF SESSION:  PT End of Session - 03/12/23 1102     Visit Number 5    Number of Visits 20    Date for PT Re-Evaluation 05/07/23    Authorization Type [employer] MCR,  Recert at visit 5    Authorization Time Period --    PT Start Time 1103    PT Stop Time 1145    PT Time Calculation (min) 42 min    Activity Tolerance Patient tolerated treatment well    Behavior During Therapy WFL for tasks assessed/performed             Past Medical History:  Diagnosis Date   Allergy    Anal fissure    Anxiety    Arthritis    Asthma    Colon polyp, hyperplastic    Depression    GERD (gastroesophageal reflux disease)    Hypertension    IBS (irritable bowel syndrome)    Vitamin deficiency    Past Surgical History:  Procedure Laterality Date   CESAREAN SECTION     TONSILLECTOMY     Patient Active Problem List   Diagnosis Date Noted   CKD (chronic kidney disease) stage 3, GFR 30-59 ml/min (HCC) 12/31/2022   Osteoarthritis of left knee 10/09/2021   Vitamin D  deficiency 10/09/2021   Essential hypertension 03/23/2020   Prolonged Q-T interval on ECG 02/07/2019   Gastroesophageal reflux disease 12/17/2018   Traumatic incomplete tear of right rotator cuff 04/08/2018   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    PCP: Job Lukes PA-C   REFERRING PROVIDER: Joane Artist RAMAN, MD  REFERRING DIAG:  Diagnosis  M54.41,G89.29 (ICD-10-CM) - Chronic right-sided low back pain with right-sided sciatica  M79.651 (ICD-10-CM) - Right thigh pain  M79.10 (ICD-10-CM) - Myalgia    Rationale for Evaluation and Treatment: Rehabilitation  THERAPY DIAG:  Other low back pain  Pain in right leg  Muscle weakness (generalized)  Difficulty in walking, not elsewhere classified  ONSET DATE: symptoms really got bad within the past month (acute on  chronic)  SUBJECTIVE:                                                                                                                                                                                           SUBJECTIVE STATEMENT: Pt states thigh pain improving. Still feels like it is going to cramp but not actually getting the cramps like before. She states R hip feeling a better. Main complaint is R sided back pain, that continues  to be bothersome with increased activity.  Pt has been seen for 5 visits since 11/4.   Having some back pain, they just scheduled an MRI because the other images showed a compression fracture. Its really debilitating when the cramps go from my thigh up to my hip. They start on my inner knee and goes diagonal across my leg to my hip. My back always hurts, sometimes the pain from my thigh will spread up into the back. I feel like I have to drag my leg due to pain sometimes. At night I get cramps in my lower legs. No injuries or weird movements that started this problem. Took vitamin D  in the past, if I do a lot of walking I start hurting, if I have to stand still in one spot my legs start burning. Very frustrated by this pain. When I laugh a lot I get a lot of cramping here (points to diaphragm level), deep breaths have been making that area cramp as well.   PERTINENT HISTORY:  Anxiety, OA, HTN, IBS  PAIN:  Are you having pain? Yes: NPRS scale: up to 6/10 Pain location: right hip going into back Pain description: hard to describe  Aggravating factors: standing still, walking too much, sitting in the wrong position, it just comes out of the blue  Relieving factors: not sure   PRECAUTIONS: Other: age indeterminate compression fracture, awaiting MRI for clarification, unable to tolerate laying supine   RED FLAGS: None   WEIGHT BEARING RESTRICTIONS: No  FALLS:  Has patient fallen in last 6 months? Yes. Number of falls 1- trip on steps, some FOF   LIVING  ENVIRONMENT: Lives with: lives alone Lives in: House/apartment Stairs: 2 STE in front, 4-5 in back Has following equipment at home: None  OCCUPATION: on disability   PLOF: Independent, Independent with basic ADLs, Independent with gait, and Independent with transfers  PATIENT GOALS: stop the cramps, stop locking up   NEXT MD VISIT: Dr. Joane 02/16/23 (referring)  OBJECTIVE:  Updated 1/2/ 25  DIAGNOSTIC FINDINGS:  CLINICAL DATA:  Acute nonlocalized abdominal pain.   EXAM: CT ABDOMEN AND PELVIS WITH CONTRAST   TECHNIQUE: Multidetector CT imaging of the abdomen and pelvis was performed using the standard protocol following bolus administration of intravenous contrast.   RADIATION DOSE REDUCTION: This exam was performed according to the departmental dose-optimization program which includes automated exposure control, adjustment of the mA and/or kV according to patient size and/or use of iterative reconstruction technique.   CONTRAST:  100mL ISOVUE -300 IOPAMIDOL  (ISOVUE -300) INJECTION 61%   COMPARISON:  None Available.   FINDINGS: Lower chest: No acute abnormality.   Hepatobiliary: No suspicious hepatic lesion. Gallbladder is unremarkable. No biliary ductal dilation.   Pancreas: No pancreatic ductal dilation or evidence of acute inflammation.   Spleen: No splenomegaly.   Adrenals/Urinary Tract: Bilateral adrenal glands appear normal. No hydronephrosis. Kidneys demonstrate symmetric enhancement. Urinary bladder is unremarkable for degree of distension.   Stomach/Bowel: Radiopaque enteric contrast material traverses the splenic flexure. Stomach is unremarkable for degree of distension. No pathologic dilation of small or large bowel. Normal appendix. Scattered colonic diverticulosis without findings of acute diverticulitis.   Vascular/Lymphatic: Aortic atherosclerosis. Smooth IVC contours. The portal, splenic and superior mesenteric veins are patent.  No pathologically enlarged abdominal or pelvic lymph nodes.   Reproductive: Uterus and bilateral adnexa are unremarkable.   Other: No significant abdominopelvic free fluid.   Musculoskeletal: Age indeterminate mild compression deformity of the L4 vertebral body. Multilevel degenerative changes spine.  IMPRESSION: 1. No acute abnormality in the abdomen or pelvis. 2. Scattered colonic diverticulosis without findings of acute diverticulitis. 3. Age indeterminate mild compression deformity of the L4 vertebral body, suggest correlation with tenderness to palpation to assess acuity. 4.  Aortic Atherosclerosis (ICD10-I70.0).  PATIENT SURVEYS:  FOTO 36 predicted 45 in 12 visits  Visit 5:  back   36.9    SCREENING FOR RED FLAGS:  Compression fracture: Yes: age indeterminate    COGNITION: Overall cognitive status: Within functional limits for tasks assessed     SENSATION: Not tested no numbness at rest, can get numbness going down to knees with extended standing   MUSCLE LENGTH:  Quads and hip flexors tight per functional observation, hamstrings moderate limitation B per functional observation   POSTURE: rounded shoulders, forward head, increased lumbar lordosis, and flexed trunk   PALPATION: TTP R lumbar paraspinals, R lateral hip , medial quad R (does have some broken blood vessels per her report)  LUMBAR ROM:   AROM eval 03/12/23  Flexion Moderate limitation; RFIS  WFL, hands to floor, slight difficulty getting up  Extension Severe limitation, sharp pains deferred REIS  Mod limitation/pain on R  Right lateral flexion Severe limitation  Mod limitation  Left lateral flexion Severe limitation  Mod limitation, pain on R  Right rotation    Left rotation     (Blank rows = not tested)   LOWER EXTREMITY MMT:    MMT Right eval Left eval Right 03/12/23 Left 03/12/23  Hip flexion 3- 3- 4- 4-  Hip extension      Hip abduction   4- 4+  Hip adduction      Hip internal  rotation      Hip external rotation      Knee flexion 4+ 4+ 4+ 4+  Knee extension 4+ 4+ 4+ 4+  Ankle dorsiflexion 5 5    Ankle plantarflexion      Ankle inversion      Ankle eversion       (Blank rows = not tested)    TODAY'S TREATMENT:                                                                                                                              DATE:   03/12/2023 Therapeutic Exercise: Aerobic: Supine:   Pelvic tilts x 10 (pain in back)  Seated:   S/L: hip abd 2 x 10 bil Standing:  R side bending stretch 15 sec x 5  (education for HEP)  Stretches:  piriformis/modified 30 sec x 2 bil; LTR x 10;  Neuromuscular Re-education: Manual Therapy: STM to R low back, SI, into R side/low ribs (in S/L ) herapeutic Activity: Self Care:   02/26/23 Therapeutic Exercise: Aerobic: Supine: Hip ER clams x 10 , 5 sec;  SLR 2 x 5  bil;  Pelvic tilts x 10  Seated:  LAQ 2.5lb  2 x 10;  S/L: hip abd 2 x 10 on  R  Standing:  Stretches:  piriformis/modified 30 sec x 2 bil; LTR x 10;  Neuromuscular Re-education: Manual Therapy:  herapeutic Activity: Self Care: Modalities:    02/03/2023 Therapeutic Exercise: Aerobic: Supine: Hip ER clams x 10 , 5 sec;  SLR 2 x 5  bil;  Pelvic tilts x 15;  Seated:  LAQ 2.5lb  2 x 10;  Standing:  Stretches: SKTC 30 sec x 3 bil with towel;  piriformis/modified 30 sec x 2 bil;  Neuromuscular Re-education: Manual Therapy: light STM/roller to R medial quad;   herapeutic Activity: Self Care: Modalities: moist heat pack to R thigh/quad x 10 min at end of session/seated.    Therapeutic Exercise: Aerobic: Supine: Hip ER clams x 10 , 5 sec;  SLR 2 x 3 reps on R;  Seated: LAQ 2 x 10;  heel slides 2 x 8;  Standing: ambulation- with spc - education on mechanics and use 45 ft x 8;  Stretches: SKTC 30 sec x 3 bil with towel;  Neuromuscular Re-education: Manual Therapy: light STM to R medial quad. Demo of tennis ball and roller for HEP Therapeutic  Activity: Self Care: Modalities: moist heat pack to R thigh/quad x 8 min at end of session/seated.     PATIENT EDUCATION:  Education details: updated and reviewed HEP.  Person educated: Patient Education method: Explanation, Demonstration, and Handouts Education comprehension: verbalized understanding, returned demonstration, and needs further education  HOME EXERCISE PROGRAM: Access Code: [email or web address]    ASSESSMENT:  CLINICAL IMPRESSION:  Re-Cert:  Pt has been seen for 5 visits. She demonstrates improving R hip and thigh pain as well as improving hip strength on R. She continues to have sensation and tenderness in R thigh/quad, but is not cramping as previously. R low back continues to be main pain location. Pt with much tenderness today in R low lumbar, SI, into Glute and some into hip. She also has tenderness into R side/lower ribs. She has had good tolerance for ther ex and manual. Noted trunk lean to R with ambulation with R stance phase, which may also be contributing to pain. Pt to benefit from continuation of skilled care, to focus on R low back pain.    OBJECTIVE IMPAIRMENTS: Abnormal gait, decreased activity tolerance, decreased mobility, difficulty walking, decreased ROM, decreased strength, increased edema, increased fascial restrictions, increased muscle spasms, impaired flexibility, impaired sensation, improper body mechanics, postural dysfunction, obesity, and pain.   ACTIVITY LIMITATIONS: carrying, lifting, bending, sitting, standing, squatting, stairs, transfers, bed mobility, and locomotion level  PARTICIPATION LIMITATIONS: meal prep, cleaning, laundry, driving, shopping, community activity, yard work, and church  PERSONAL FACTORS: Age, Behavior pattern, Education, Fitness, Past/current experiences, Social background, and Time since onset of injury/illness/exacerbation are also affecting patient's functional outcome.   REHAB POTENTIAL: Fair chronicity of  condition  CLINICAL DECISION MAKING: Evolving/moderate complexity  EVALUATION COMPLEXITY: Moderate   GOALS: Goals reviewed with patient? No  SHORT TERM GOALS: Target date: 02/09/2023    Will be compliant with appropriate progressive HEP   Goal status: MET  2.  Will demonstrate quality biomechanics for bed mobility and floor to waist lifting mechanics   Goal status: ongoing   3.  Will demonstrate improved awareness of posture with use of postural aides PRN/as desired   Goal status: ongoing    LONG TERM GOALS: Target date: 05/06/22    MMT to improve by at least one grade in all weak groups  Baseline:  Goal status: ongoing   2.  Cramping symptoms to have  improved by at least 50% to improve QOL and sleep  Baseline:  Goal status: ongoing   3.  Pain to be no more than 3/10 at worst to improve QOL  Baseline:  Goal status: ongoing   4.  Will be able to perform all functional household and community tasks with pain no more than 3/10 Baseline:  Goal status: ongoing   5.  Muscle flexibility impairments to have improved by at least 50%  Baseline:  Goal status: ongoing   6.  FOTO score to be within 5 points of predicted value by time of DC  Baseline:  Goal status: ongoing   PLAN:  PT FREQUENCY: 1x/week pt request   PT DURATION: 8 weeks  PLANNED INTERVENTIONS: [id number]- PT Re-evaluation, 97110-Therapeutic exercises, 97530- Therapeutic activity, [id number]- Neuromuscular re-education, 97535- Self Care, [id number]- Manual therapy, [id number]- Gait training, [phone]- Orthotic Fit/training, [id number]- Aquatic Therapy, [id number]- Ultrasound, [id number]- Ionotophoresis 4mg /ml Dexamethasone , Taping, Dry Needling, Cryotherapy, and Moist heat.  PLAN FOR NEXT SESSION:  Core strength, R low back pain, R side body stretching, R low rib pain, walking mechanics.    Tinnie Don, PT, DPT 12:55 PM  03/12/23

## 2023-03-19 ENCOUNTER — Ambulatory Visit: Payer: Medicare Other | Admitting: Physical Therapy

## 2023-03-19 ENCOUNTER — Encounter: Payer: Self-pay | Admitting: Physical Therapy

## 2023-03-19 DIAGNOSIS — R262 Difficulty in walking, not elsewhere classified: Secondary | ICD-10-CM

## 2023-03-19 DIAGNOSIS — M79604 Pain in right leg: Secondary | ICD-10-CM

## 2023-03-19 DIAGNOSIS — M5459 Other low back pain: Secondary | ICD-10-CM

## 2023-03-19 DIAGNOSIS — M6281 Muscle weakness (generalized): Secondary | ICD-10-CM

## 2023-03-19 NOTE — Therapy (Signed)
 OUTPATIENT PHYSICAL THERAPY THORACOLUMBAR TREATMENT   Patient Name: KAVITA BARTL MRN: [id number] DOB:23-Jun-1955, 68 y.o., female Today's Date: 03/19/2023   END OF SESSION:  PT End of Session - 03/19/23 1157     Visit Number 6    Number of Visits 20    Date for PT Re-Evaluation 05/07/23    Authorization Type [employer] MCR,  Recert at visit 5    Authorization Time Period 1 of 4    PT Start Time 1104    PT Stop Time 1147    PT Time Calculation (min) 43 min    Activity Tolerance Patient tolerated treatment well    Behavior During Therapy WFL for tasks assessed/performed              Past Medical History:  Diagnosis Date   Allergy    Anal fissure    Anxiety    Arthritis    Asthma    Colon polyp, hyperplastic    Depression    GERD (gastroesophageal reflux disease)    Hypertension    IBS (irritable bowel syndrome)    Vitamin deficiency    Past Surgical History:  Procedure Laterality Date   CESAREAN SECTION     TONSILLECTOMY     Patient Active Problem List   Diagnosis Date Noted   CKD (chronic kidney disease) stage 3, GFR 30-59 ml/min (HCC) 12/31/2022   Osteoarthritis of left knee 10/09/2021   Vitamin D  deficiency 10/09/2021   Essential hypertension 03/23/2020   Prolonged Q-T interval on ECG 02/07/2019   Gastroesophageal reflux disease 12/17/2018   Traumatic incomplete tear of right rotator cuff 04/08/2018   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    PCP: Job Lukes PA-C   REFERRING PROVIDER: Joane Artist RAMAN, MD  REFERRING DIAG:  Diagnosis  M54.41,G89.29 (ICD-10-CM) - Chronic right-sided low back pain with right-sided sciatica  M79.651 (ICD-10-CM) - Right thigh pain  M79.10 (ICD-10-CM) - Myalgia    Rationale for Evaluation and Treatment: Rehabilitation  THERAPY DIAG:  Other low back pain  Pain in right leg  Muscle weakness (generalized)  Difficulty in walking, not elsewhere classified  ONSET DATE:  SUBJECTIVE:                                                                                                                                                                                            SUBJECTIVE STATEMENT: Pt reports;  Standing for longer: makes bil thighs hurt.  Walking for a while, back hurts. Still reports mostly constant pain in back.   Having some back pain, they just scheduled an MRI because the other images showed a compression fracture. Its really debilitating  when the cramps go from my thigh up to my hip. They start on my inner knee and goes diagonal across my leg to my hip. My back always hurts, sometimes the pain from my thigh will spread up into the back. I feel like I have to drag my leg due to pain sometimes. At night I get cramps in my lower legs. No injuries or weird movements that started this problem. Took vitamin D  in the past, if I do a lot of walking I start hurting, if I have to stand still in one spot my legs start burning. Very frustrated by this pain. When I laugh a lot I get a lot of cramping here (points to diaphragm level), deep breaths have been making that area cramp as well.   PERTINENT HISTORY:  Anxiety, OA, HTN, IBS  PAIN:  Are you having pain? Yes: NPRS scale: up to 6 /10 Pain location: right hip going into back Pain description: hard to describe  Aggravating factors: standing still, walking too much, sitting in the wrong position, it just comes out of the blue  Relieving factors: not sure   PRECAUTIONS: Other: age indeterminate compression fracture, awaiting MRI for clarification, unable to tolerate laying supine   RED FLAGS: None   WEIGHT BEARING RESTRICTIONS: No  FALLS:  Has patient fallen in last 6 months? Yes. Number of falls 1- trip on steps, some FOF   LIVING ENVIRONMENT: Lives with: lives alone Lives in: House/apartment Stairs: 2 STE in front, 4-5 in back Has following equipment at home: None  OCCUPATION: on disability   PLOF: Independent, Independent with basic ADLs,  Independent with gait, and Independent with transfers  PATIENT GOALS: stop the cramps, stop locking up   NEXT MD VISIT: Dr. Joane 02/16/23 (referring)  OBJECTIVE:  Updated 1/2/ 25  DIAGNOSTIC FINDINGS:  CLINICAL DATA:  Acute nonlocalized abdominal pain.   EXAM: CT ABDOMEN AND PELVIS WITH CONTRAST   TECHNIQUE: Multidetector CT imaging of the abdomen and pelvis was performed using the standard protocol following bolus administration of intravenous contrast.   RADIATION DOSE REDUCTION: This exam was performed according to the departmental dose-optimization program which includes automated exposure control, adjustment of the mA and/or kV according to patient size and/or use of iterative reconstruction technique.   CONTRAST:  100mL ISOVUE -300 IOPAMIDOL  (ISOVUE -300) INJECTION 61%   COMPARISON:  None Available.   FINDINGS: Lower chest: No acute abnormality.   Hepatobiliary: No suspicious hepatic lesion. Gallbladder is unremarkable. No biliary ductal dilation.   Pancreas: No pancreatic ductal dilation or evidence of acute inflammation.   Spleen: No splenomegaly.   Adrenals/Urinary Tract: Bilateral adrenal glands appear normal. No hydronephrosis. Kidneys demonstrate symmetric enhancement. Urinary bladder is unremarkable for degree of distension.   Stomach/Bowel: Radiopaque enteric contrast material traverses the splenic flexure. Stomach is unremarkable for degree of distension. No pathologic dilation of small or large bowel. Normal appendix. Scattered colonic diverticulosis without findings of acute diverticulitis.   Vascular/Lymphatic: Aortic atherosclerosis. Smooth IVC contours. The portal, splenic and superior mesenteric veins are patent. No pathologically enlarged abdominal or pelvic lymph nodes.   Reproductive: Uterus and bilateral adnexa are unremarkable.   Other: No significant abdominopelvic free fluid.   Musculoskeletal: Age indeterminate mild compression  deformity of the L4 vertebral body. Multilevel degenerative changes spine.   IMPRESSION: 1. No acute abnormality in the abdomen or pelvis. 2. Scattered colonic diverticulosis without findings of acute diverticulitis. 3. Age indeterminate mild compression deformity of the L4 vertebral body, suggest correlation with tenderness to  palpation to assess acuity. 4.  Aortic Atherosclerosis (ICD10-I70.0).  PATIENT SURVEYS:  FOTO 36 predicted 45 in 12 visits  Visit 5:  back   36.9    SCREENING FOR RED FLAGS:  Compression fracture: Yes: age indeterminate    COGNITION: Overall cognitive status: Within functional limits for tasks assessed     SENSATION: Not tested no numbness at rest, can get numbness going down to knees with extended standing   MUSCLE LENGTH:  Quads and hip flexors tight per functional observation, hamstrings moderate limitation B per functional observation   POSTURE: rounded shoulders, forward head, increased lumbar lordosis, and flexed trunk   PALPATION: TTP R lumbar paraspinals, R lateral hip , medial quad R (does have some broken blood vessels per her report)  LUMBAR ROM:   AROM eval 03/12/23  Flexion Moderate limitation; RFIS  WFL, hands to floor, slight difficulty getting up  Extension Severe limitation, sharp pains deferred REIS  Mod limitation/pain on R  Right lateral flexion Severe limitation  Mod limitation  Left lateral flexion Severe limitation  Mod limitation, pain on R  Right rotation    Left rotation     (Blank rows = not tested)   LOWER EXTREMITY MMT:    MMT Right eval Left eval Right 03/12/23 Left 03/12/23  Hip flexion 3- 3- 4- 4-  Hip extension      Hip abduction   4- 4+  Hip adduction      Hip internal rotation      Hip external rotation      Knee flexion 4+ 4+ 4+ 4+  Knee extension 4+ 4+ 4+ 4+  Ankle dorsiflexion 5 5    Ankle plantarflexion      Ankle inversion      Ankle eversion       (Blank rows = not tested)    TODAY'S  TREATMENT:                                                                                                                              DATE:   03/19/2023 Therapeutic Exercise: Aerobic: Supine:  SLR x 10 bil;  TA with breathing x 10;  bridging  x 10;  Seated:   S/L: hip abd 2 x 10 bil  Standing:  R side bending stretch 15 sec x 5 :  hip abd x 10 bil; staggered stance, fwd weight shifts for hip/trunk posture x 20 bil;  Stretches:    LTR x 10;  Neuromuscular Re-education: Manual Therapy:  herapeutic Activity: Self Care: Gait: education and practice for decreasing lateral trunk sway with walking, focus on continued arm swing- without AD,   With SPC: education and practice for sequencing and optimal use of cane- recommended for longer distances for back pain    02/26/23 Therapeutic Exercise: Aerobic: Supine: Hip ER clams x 10 , 5 sec;  SLR 2 x 5  bil;  Pelvic tilts x 10  Seated:  LAQ 2.5lb  2 x 10;  S/L: hip abd 2 x 10 on R  Standing:  Stretches:  piriformis/modified 30 sec x 2 bil; LTR x 10;  Neuromuscular Re-education: Manual Therapy:  herapeutic Activity: Self Care: Modalities:    02/03/2023 Therapeutic Exercise: Aerobic: Supine: Hip ER clams x 10 , 5 sec;  SLR 2 x 5  bil;  Pelvic tilts x 15;  Seated:  LAQ 2.5lb  2 x 10;  Standing:  Stretches: SKTC 30 sec x 3 bil with towel;  piriformis/modified 30 sec x 2 bil;  Neuromuscular Re-education: Manual Therapy: light STM/roller to R medial quad;   herapeutic Activity: Self Care: Modalities: moist heat pack to R thigh/quad x 10 min at end of session/seated.    Therapeutic Exercise: Aerobic: Supine: Hip ER clams x 10 , 5 sec;  SLR 2 x 3 reps on R;  Seated: LAQ 2 x 10;  heel slides 2 x 8;  Standing: ambulation- with spc - education on mechanics and use 45 ft x 8;  Stretches: SKTC 30 sec x 3 bil with towel;  Neuromuscular Re-education: Manual Therapy: light STM to R medial quad. Demo of tennis ball and roller for  HEP Therapeutic Activity: Self Care: Modalities: moist heat pack to R thigh/quad x 8 min at end of session/seated.     PATIENT EDUCATION:  Education details: updated and reviewed HEP.  Person educated: Patient Education method: Explanation, Demonstration, and Handouts Education comprehension: verbalized understanding, returned demonstration, and needs further education  HOME EXERCISE PROGRAM: Access Code: [email or web address]    ASSESSMENT:  CLINICAL IMPRESSION: Pt with good ability for progressive ther ex today. She is challenged with hip and core strength, but no increased pain. Reviewed optimal gait mechanics today, pt with improved mechanics with focused attention to decreasing trunk sway, as well as with use of SPC. Recommended use of cane for longer distances for offloading and back pain.  Re-Cert:  Pt has been seen for 5 visits. She demonstrates improving R hip and thigh pain as well as improving hip strength on R. She continues to have sensation and tenderness in R thigh/quad, but is not cramping as previously. R low back continues to be main pain location. Pt with much tenderness today in R low lumbar, SI, into Glute and some into hip. She also has tenderness into R side/lower ribs. She has had good tolerance for ther ex and manual. Noted trunk lean to R with ambulation with R stance phase, which may also be contributing to pain. Pt to benefit from continuation of skilled care, to focus on R low back pain.    OBJECTIVE IMPAIRMENTS: Abnormal gait, decreased activity tolerance, decreased mobility, difficulty walking, decreased ROM, decreased strength, increased edema, increased fascial restrictions, increased muscle spasms, impaired flexibility, impaired sensation, improper body mechanics, postural dysfunction, obesity, and pain.   ACTIVITY LIMITATIONS: carrying, lifting, bending, sitting, standing, squatting, stairs, transfers, bed mobility, and locomotion level  PARTICIPATION LIMITATIONS:  meal prep, cleaning, laundry, driving, shopping, community activity, yard work, and church  PERSONAL FACTORS: Age, Behavior pattern, Education, Fitness, Past/current experiences, Social background, and Time since onset of injury/illness/exacerbation are also affecting patient's functional outcome.   REHAB POTENTIAL: Fair chronicity of condition  CLINICAL DECISION MAKING: Evolving/moderate complexity  EVALUATION COMPLEXITY: Moderate   GOALS: Goals reviewed with patient? No  SHORT TERM GOALS: Target date: 02/09/2023    Will be compliant with appropriate progressive HEP   Goal status: MET  2.  Will demonstrate quality biomechanics for bed mobility and floor to waist  lifting mechanics   Goal status: ongoing   3.  Will demonstrate improved awareness of posture with use of postural aides PRN/as desired   Goal status: ongoing    LONG TERM GOALS: Target date: 05/06/22    MMT to improve by at least one grade in all weak groups  Baseline:  Goal status: ongoing   2.  Cramping symptoms to have improved by at least 50% to improve QOL and sleep  Baseline:  Goal status: ongoing   3.  Pain to be no more than 3/10 at worst to improve QOL  Baseline:  Goal status: ongoing   4.  Will be able to perform all functional household and community tasks with pain no more than 3/10 Baseline:  Goal status: ongoing   5.  Muscle flexibility impairments to have improved by at least 50%  Baseline:  Goal status: ongoing   6.  FOTO score to be within 5 points of predicted value by time of DC  Baseline:  Goal status: ongoing   PLAN:  PT FREQUENCY: 1x/week pt request   PT DURATION: 8 weeks  PLANNED INTERVENTIONS: [id number]- PT Re-evaluation, 97110-Therapeutic exercises, 97530- Therapeutic activity, [id number]- Neuromuscular re-education, 97535- Self Care, [id number]- Manual therapy, [id number]- Gait training, [phone]- Orthotic Fit/training, [id number]- Aquatic Therapy, [id number]- Ultrasound, [id number]- Ionotophoresis 4mg /ml  Dexamethasone , Taping, Dry Needling, Cryotherapy, and Moist heat.  PLAN FOR NEXT SESSION:  Core strength, R low back pain, R side body stretching, R low rib pain, walking mechanics.    Tinnie Don, PT, DPT 11:59 AM  03/19/23

## 2023-03-26 ENCOUNTER — Ambulatory Visit: Payer: Medicare Other | Admitting: Physical Therapy

## 2023-03-26 ENCOUNTER — Encounter: Payer: Self-pay | Admitting: Physical Therapy

## 2023-03-26 DIAGNOSIS — M5459 Other low back pain: Secondary | ICD-10-CM

## 2023-03-26 DIAGNOSIS — M79604 Pain in right leg: Secondary | ICD-10-CM

## 2023-03-26 DIAGNOSIS — M6281 Muscle weakness (generalized): Secondary | ICD-10-CM | POA: Diagnosis not present

## 2023-03-26 NOTE — Therapy (Signed)
OUTPATIENT PHYSICAL THERAPY THORACOLUMBAR TREATMENT   Patient Name: DEMEKA FRAZER MRN: [id number] DOB:[birth year], 68 y.o., female Today's Date: 03/26/2023   END OF SESSION:  PT End of Session - 03/26/23 1228     Visit Number 7    Number of Visits 20    Date for PT Re-Evaluation 05/07/23    Authorization Type [employer] MCR,  Recert at visit 5    Authorization Time Period 2 of 4    PT Start Time 1217    PT Stop Time 1300    PT Time Calculation (min) 43 min    Activity Tolerance Patient tolerated treatment well    Behavior During Therapy WFL for tasks assessed/performed               Past Medical History:  Diagnosis Date   Allergy    Anal fissure    Anxiety    Arthritis    Asthma    Colon polyp, hyperplastic    Depression    GERD (gastroesophageal reflux disease)    Hypertension    IBS (irritable bowel syndrome)    Vitamin deficiency    Past Surgical History:  Procedure Laterality Date   CESAREAN SECTION     TONSILLECTOMY     Patient Active Problem List   Diagnosis Date Noted   CKD (chronic kidney disease) stage 3, GFR 30-59 ml/min (HCC) 12/31/2022   Osteoarthritis of left knee 10/09/2021   Vitamin D deficiency 10/09/2021   Essential hypertension 03/23/2020   Prolonged Q-T interval on ECG 02/07/2019   Gastroesophageal reflux disease 12/17/2018   Traumatic incomplete tear of right rotator cuff 04/08/2018   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    PCP: Jarold Motto PA-C   REFERRING PROVIDER: Rodolph Bong, MD  REFERRING DIAG:  Diagnosis  M54.41,G89.29 (ICD-10-CM) - Chronic right-sided low back pain with right-sided sciatica  M79.651 (ICD-10-CM) - Right thigh pain  M79.10 (ICD-10-CM) - Myalgia    Rationale for Evaluation and Treatment: Rehabilitation  THERAPY DIAG:  Other low back pain  Pain in right leg  Muscle weakness (generalized)  ONSET DATE:  SUBJECTIVE:                                                                                                                                                                                            SUBJECTIVE STATEMENT: Pt reports continued pain on R side of back, difficulty with bending to sift cat litter. Cramping much better in thigh.   Having some back pain, they just scheduled an MRI because the other images showed a compression fracture. Its really debilitating when the cramps go from my thigh up to  my hip. They start on my inner knee and goes diagonal across my leg to my hip. My back always hurts, sometimes the pain from my thigh will spread up into the back. I feel like I have to drag my leg due to pain sometimes. At night I get cramps in my lower legs. No injuries or weird movements that started this problem. Took vitamin D in the past, if I do a lot of walking I start hurting, if I have to stand still in one spot my legs start burning. Very frustrated by this pain. When I laugh a lot I get a lot of cramping here (points to diaphragm level), deep breaths have been making that area cramp as well.   PERTINENT HISTORY:  Anxiety, OA, HTN, IBS  PAIN:  Are you having pain? Yes: NPRS scale: up to 6 /10 Pain location: right hip going into back Pain description: "hard to describe"  Aggravating factors: standing still, walking too much, sitting in the wrong position, "it just comes out of the blue"  Relieving factors: not sure   PRECAUTIONS: Other: age indeterminate compression fracture, awaiting MRI for clarification, unable to tolerate laying supine   RED FLAGS: None   WEIGHT BEARING RESTRICTIONS: No  FALLS:  Has patient fallen in last 6 months? Yes. Number of falls 1- trip on steps, some FOF   LIVING ENVIRONMENT: Lives with: lives alone Lives in: House/apartment Stairs: 2 STE in front, 4-5 in back Has following equipment at home: None  OCCUPATION: on disability   PLOF: Independent, Independent with basic ADLs, Independent with gait, and Independent with transfers  PATIENT GOALS:  stop the cramps, stop locking up   NEXT MD VISIT: Dr. Denyse Amass 02/16/23 (referring)  OBJECTIVE:  Updated 1/2/ 25  DIAGNOSTIC FINDINGS:  CLINICAL DATA:  Acute nonlocalized abdominal pain.   EXAM: CT ABDOMEN AND PELVIS WITH CONTRAST   TECHNIQUE: Multidetector CT imaging of the abdomen and pelvis was performed using the standard protocol following bolus administration of intravenous contrast.   RADIATION DOSE REDUCTION: This exam was performed according to the departmental dose-optimization program which includes automated exposure control, adjustment of the mA and/or kV according to patient size and/or use of iterative reconstruction technique.   CONTRAST:  ISOVUE-300 IOPAMIDOL (ISOVUE-300) INJECTION 61%   COMPARISON:  None Available.   FINDINGS: Lower chest: No acute abnormality.   Hepatobiliary: No suspicious hepatic lesion. Gallbladder is unremarkable. No biliary ductal dilation.   Pancreas: No pancreatic ductal dilation or evidence of acute inflammation.   Spleen: No splenomegaly.   Adrenals/Urinary Tract: Bilateral adrenal glands appear normal. No hydronephrosis. Kidneys demonstrate symmetric enhancement. Urinary bladder is unremarkable for degree of distension.   Stomach/Bowel: Radiopaque enteric contrast material traverses the splenic flexure. Stomach is unremarkable for degree of distension. No pathologic dilation of small or large bowel. Normal appendix. Scattered colonic diverticulosis without findings of acute diverticulitis.   Vascular/Lymphatic: Aortic atherosclerosis. Smooth IVC contours. The portal, splenic and superior mesenteric veins are patent. No pathologically enlarged abdominal or pelvic lymph nodes.   Reproductive: Uterus and bilateral adnexa are unremarkable.   Other: No significant abdominopelvic free fluid.   Musculoskeletal: Age indeterminate mild compression deformity of the L4 vertebral body. Multilevel degenerative changes  spine.   IMPRESSION: 1. No acute abnormality in the abdomen or pelvis. 2. Scattered colonic diverticulosis without findings of acute diverticulitis. 3. Age indeterminate mild compression deformity of the L4 vertebral body, suggest correlation with tenderness to palpation to assess acuity. 4.  Aortic Atherosclerosis (ICD10-I70.0).  PATIENT SURVEYS:  FOTO 36 predicted 45 in 12 visits  Visit 5:  back   36.9    SCREENING FOR RED FLAGS:  Compression fracture: Yes: age indeterminate    COGNITION: Overall cognitive status: Within functional limits for tasks assessed     SENSATION: Not tested no numbness at rest, can get numbness going down to knees with extended standing   MUSCLE LENGTH:  Quads and hip flexors tight per functional observation, hamstrings moderate limitation B per functional observation   POSTURE: rounded shoulders, forward head, increased lumbar lordosis, and flexed trunk   PALPATION: TTP R lumbar paraspinals, R lateral hip , medial quad R (does have some broken blood vessels per her report)  LUMBAR ROM:   AROM eval 03/12/23  Flexion Moderate limitation; RFIS  WFL, hands to floor, slight difficulty getting up  Extension Severe limitation, sharp pains deferred REIS  Mod limitation/pain on R  Right lateral flexion Severe limitation  Mod limitation  Left lateral flexion Severe limitation  Mod limitation, pain on R  Right rotation    Left rotation     (Blank rows = not tested)   LOWER EXTREMITY MMT:    MMT Right eval Left eval Right 03/12/23 Left 03/12/23  Hip flexion 3- 3- 4- 4-  Hip extension      Hip abduction   4- 4+  Hip adduction      Hip internal rotation      Hip external rotation      Knee flexion 4+ 4+ 4+ 4+  Knee extension 4+ 4+ 4+ 4+  Ankle dorsiflexion 5 5    Ankle plantarflexion      Ankle inversion      Ankle eversion       (Blank rows = not tested)    TODAY'S TREATMENT:                                                                                                                               DATE:   03/26/2023 Therapeutic Exercise: Aerobic: Supine:  SLR 2 x 10 bil;   bridging  x 10;  Seated:  education on hip hinge x 10, sit to stand x 10,  S/L: hip abd 2 x 10 bil,  Standing:  R side bending stretch 15 sec x 5:  hip abd x 10 bil; marching x 20 - no UE support (cuing to decreased trunk SB) Squat to mat table x 15; , with education on optimal bending mechanics for scooping cat litter.  Stretches:    LTR x 10;  Neuromuscular Re-education: Manual Therapy:  Therapeutic Activity: Self Care: Gait:    Previous:  Therapeutic Exercise: Aerobic: Supine:  SLR x 10 bil;  TA with breathing x 10;  bridging  x 10;  Seated:   S/L: hip abd 2 x 10 bil  Standing:  R side bending stretch 15 sec x 5 :  hip abd x 10 bil; staggered stance, fwd weight  shifts for hip/trunk posture x 20 bil;  Stretches:    LTR x 10;  Neuromuscular Re-education: Manual Therapy:  herapeutic Activity: Self Care: Gait: education and practice for decreasing lateral trunk sway with walking, focus on continued arm swing- without AD,   With SPC: education and practice for sequencing and optimal use of cane- recommended for longer distances for back pain    02/26/23 Therapeutic Exercise: Aerobic: Supine: Hip ER clams x 10 , 5 sec;  SLR 2 x 5  bil;  Pelvic tilts x 10 (pain)  Seated:  LAQ 2.5lb  2 x 10;  S/L: hip abd 2 x 10 on R  Standing:  Stretches:  piriformis/modified 30 sec x 2 bil; LTR x 10;  Neuromuscular Re-education: Manual Therapy:  herapeutic Activity: Self Care: Modalities:    02/03/2023 Therapeutic Exercise: Aerobic: Supine: Hip ER clams x 10 , 5 sec;  SLR 2 x 5  bil;  Pelvic tilts x 15;  Seated:  LAQ 2.5lb  2 x 10;  Standing:  Stretches: SKTC 30 sec x 3 bil with towel;  piriformis/modified 30 sec x 2 bil;  Neuromuscular Re-education: Manual Therapy: light STM/roller to R medial quad;   herapeutic Activity: Self  Care: Modalities: moist heat pack to R thigh/quad x 10 min at end of session/seated.     PATIENT EDUCATION:  Education details: updated and reviewed HEP.  Person educated: Patient Education method: Explanation, Demonstration, and Handouts Education comprehension: verbalized understanding, returned demonstration, and needs further education  HOME EXERCISE PROGRAM: Access Code: [email or web address]    ASSESSMENT:  CLINICAL IMPRESSION: Pt with ongoing pain in R side of back. Education on bending and optimal squat mechanics today, as well as option to purchase long handled litter scoop. Pt to benefit from ongoing education for this as well as progressive strength as tolerated.   Re-Cert:  Pt has been seen for 5 visits. She demonstrates improving R hip and thigh pain as well as improving hip strength on R. She continues to have sensation and tenderness in R thigh/quad, but is not cramping as previously. R low back continues to be main pain location. Pt with much tenderness today in R low lumbar, SI, into Glute and some into hip. She also has tenderness into R side/lower ribs. She has had good tolerance for ther ex and manual. Noted trunk lean to R with ambulation with R stance phase, which may also be contributing to pain. Pt to benefit from continuation of skilled care, to focus on R low back pain.    OBJECTIVE IMPAIRMENTS: Abnormal gait, decreased activity tolerance, decreased mobility, difficulty walking, decreased ROM, decreased strength, increased edema, increased fascial restrictions, increased muscle spasms, impaired flexibility, impaired sensation, improper body mechanics, postural dysfunction, obesity, and pain.   ACTIVITY LIMITATIONS: carrying, lifting, bending, sitting, standing, squatting, stairs, transfers, bed mobility, and locomotion level  PARTICIPATION LIMITATIONS: meal prep, cleaning, laundry, driving, shopping, community activity, yard work, and church  PERSONAL FACTORS: Age, Behavior  pattern, Education, Fitness, Past/current experiences, Social background, and Time since onset of injury/illness/exacerbation are also affecting patient's functional outcome.   REHAB POTENTIAL: Fair chronicity of condition  CLINICAL DECISION MAKING: Evolving/moderate complexity  EVALUATION COMPLEXITY: Moderate   GOALS: Goals reviewed with patient? No  SHORT TERM GOALS: Target date: 02/09/2023    Will be compliant with appropriate progressive HEP   Goal status: MET  2.  Will demonstrate quality biomechanics for bed mobility and floor to waist lifting mechanics   Goal status: ongoing   3.  Will demonstrate improved awareness of posture with use of postural aides PRN/as desired   Goal status: ongoing    LONG TERM GOALS: Target date: 05/06/22    MMT to improve by at least one grade in all weak groups  Baseline:  Goal status: ongoing   2.  Cramping symptoms to have improved by at least 50% to improve QOL and sleep  Baseline:  Goal status: ongoing   3.  Pain to be no more than 3/10 at worst to improve QOL  Baseline:  Goal status: ongoing   4.  Will be able to perform all functional household and community tasks with pain no more than 3/10 Baseline:  Goal status: ongoing   5.  Muscle flexibility impairments to have improved by at least 50%  Baseline:  Goal status: ongoing   6.  FOTO score to be within 5 points of predicted value by time of DC  Baseline:  Goal status: ongoing   PLAN:  PT FREQUENCY: 1x/week pt request   PT DURATION: 8 weeks  PLANNED INTERVENTIONS: 97164- PT Re-evaluation, 97110-Therapeutic exercises, 97530- Therapeutic activity, [id number]- Neuromuscular re-education, 97535- Self Care, 16109- Manual therapy, [id number]- Gait training, [phone]- Orthotic Fit/training, [id number]- Aquatic Therapy, [id number]- Ultrasound, [id number]- Ionotophoresis 4mg /ml Dexamethasone, Taping, Dry Needling, Cryotherapy, and Moist heat.  PLAN FOR NEXT SESSION:  Core strength, R low back pain, R  side body stretching, R low rib pain, review bending/lifting   Sedalia Muta, PT, DPT 5:39 PM  03/26/23

## 2023-04-01 ENCOUNTER — Other Ambulatory Visit: Payer: Self-pay | Admitting: Physician Assistant

## 2023-04-01 ENCOUNTER — Encounter: Payer: Medicare Other | Admitting: Physician Assistant

## 2023-04-01 DIAGNOSIS — M1712 Unilateral primary osteoarthritis, left knee: Secondary | ICD-10-CM

## 2023-04-01 MED ORDER — TRAMADOL HCL 50 MG PO TABS: 50.0000 mg | ORAL_TABLET | Freq: Two times a day (BID) | ORAL | 0 refills | Status: DC | PRN

## 2023-04-01 NOTE — Telephone Encounter (Signed)
Pt requesting refill for Tramadol 50 mg tablet. Last OV 12/30/2022.

## 2023-04-01 NOTE — Telephone Encounter (Signed)
Copied from CRM [id number]. Topic: Clinical - Medication Refill >> Apr 01, 2023  9:37 AM Drema Balzarine wrote: Most Recent Primary Care Visit:  Provider: Jarold Motto  Department: [facility]  Visit Type: OFFICE VISIT  Date: 12/30/2022  Medication: TraMADol   Has the patient contacted their pharmacy? Yes (Agent: If no, request that the patient contact the pharmacy for the refill. If patient does not wish to contact the pharmacy document the reason why and proceed with request.) (Agent: If yes, when and what did the pharmacy advise?)  Is this the correct pharmacy for this prescription? Yes If no, delete pharmacy and type the correct one.  This is the patient's preferred pharmacy:   [facility] [address] ([address]), [place] - [address] [place] ([place]) [place] [address] Phone: [phone] Fax: [phone]   Has the prescription been filled recently? Yes  Is the patient out of the medication? No  Has the patient been seen for an appointment in the last year OR does the patient have an upcoming appointment? Yes  Can we respond through MyChart? Yes  Agent: Please be advised that Rx refills may take up to 3 business days. We ask that you follow-up with your pharmacy.

## 2023-04-02 ENCOUNTER — Encounter: Payer: Medicare Other | Admitting: Physical Therapy

## 2023-04-02 ENCOUNTER — Telehealth: Payer: Self-pay | Admitting: Physical Therapy

## 2023-04-02 ENCOUNTER — Other Ambulatory Visit: Payer: Self-pay | Admitting: Physician Assistant

## 2023-04-02 DIAGNOSIS — R0602 Shortness of breath: Secondary | ICD-10-CM

## 2023-04-02 MED ORDER — ALBUTEROL SULFATE HFA 108 (90 BASE) MCG/ACT IN AERS: INHALATION_SPRAY | RESPIRATORY_TRACT | 2 refills | Status: DC

## 2023-04-02 MED ORDER — QVAR REDIHALER 80 MCG/ACT IN AERB: 2.0000 | INHALATION_SPRAY | Freq: Two times a day (BID) | RESPIRATORY_TRACT | 2 refills | Status: DC

## 2023-04-02 NOTE — Telephone Encounter (Signed)
Copied from CRM [id number]. Topic: Clinical - Medication Refill >> Apr 02, 2023 12:57 PM Sonny Dandy B wrote: Most Recent Primary Care Visit:  Provider: Jarold Motto  Department: [facility]  Visit Type: OFFICE VISIT  Date: 12/30/2022  Medication: albuterol (VENTOLIN HFA) 108 (90 Base) MCG/ACT inhaler, beclomethasone (QVAR REDIHALER) 80 MCG/ACT inhaler   Has the patient contacted their pharmacy? Yes (Agent: If no, request that the patient contact the pharmacy for the refill. If patient does not wish to contact the pharmacy document the reason why and proceed with request.) (Agent: If yes, when and what did the pharmacy advise?)  Is this the correct pharmacy for this prescription? Yes If no, delete pharmacy and type the correct one.  This is the patient's preferred pharmacy:  [facility] [address] ([address]), [place] - [address] [place] ([place]) [place] [address] Phone: [phone] Fax: [phone]   Has the prescription been filled recently? Yes  Is the patient out of the medication? Yes  Has the patient been seen for an appointment in the last year OR does the patient have an upcoming appointment? Yes  Can we respond through MyChart? Yes  Agent: Please be advised that Rx refills may take up to 3 business days. We ask that you follow-up with your pharmacy.

## 2023-04-02 NOTE — Telephone Encounter (Signed)
No-show for PT appt 04/02/23. Called number on file, line rang for quite some time without going to VM so unable to leave message.  Nedra Hai, PT, DPT 04/02/23 11:22 AM

## 2023-04-02 NOTE — Telephone Encounter (Signed)
Last Fill: Albuterol: 09/24/2021                Qvar:       10/07/2022  Last OV: 12/30/2022 Next OV: 09/14/2023 AWV  Routing to provider for review/authorization.

## 2023-04-09 ENCOUNTER — Ambulatory Visit: Payer: Medicare Other | Admitting: Physical Therapy

## 2023-04-09 ENCOUNTER — Encounter: Payer: Self-pay | Admitting: Physical Therapy

## 2023-04-09 DIAGNOSIS — M79604 Pain in right leg: Secondary | ICD-10-CM

## 2023-04-09 DIAGNOSIS — M6281 Muscle weakness (generalized): Secondary | ICD-10-CM | POA: Diagnosis not present

## 2023-04-09 DIAGNOSIS — R262 Difficulty in walking, not elsewhere classified: Secondary | ICD-10-CM

## 2023-04-09 DIAGNOSIS — M5459 Other low back pain: Secondary | ICD-10-CM
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
## 2023-04-12 ENCOUNTER — Encounter: Payer: Self-pay | Admitting: Physical Therapy

## 2023-04-16 ENCOUNTER — Encounter: Payer: Medicare Other | Admitting: Physical Therapy

## 2023-04-23 ENCOUNTER — Encounter: Payer: Medicare Other | Admitting: Physical Therapy

## 2023-06-08 ENCOUNTER — Encounter: Payer: Self-pay | Admitting: Cardiovascular Disease

## 2023-06-08 NOTE — Progress Notes (Unsigned)
 Cardiology Office Note:    Date:  06/09/2023   ID:  Virginia Cabrera, DOB [birth year], MRN [id number]  PCP:  Jarold Motto, PA  Cardiologist: Ryliegh Mcduffey  Electrophysiologist:  None   Referring MD: Jarold Motto, PA   Chief Complaint  Patient presents with   Hypertension         History of Present Illness:    Virginia Cabrera is a 68 y.o. female with a hx of HTN  Were are seeing her at the request of Janeece Agee for further evaluation of a long QT interval  Hx of HTN and obesity  Also has knee and back issues .   In Oct. She had an episode of severe leg cramps at [facility]   She had pre-syncope . Cold, clammy , felt pale,   Went to ER , found to have very low potassium level .  Was having lots of PVCs   She saw her primary medical doctor on October 21. At that time her potassium was still 3.3. QT interval was 418 ms with a QTC of 457 ms. She remains a little fatigued but has not had any further episodes of this leg cramping or cold and clammy sensation or presyncope.  She has a knee injury that is going to require surgery at some point. She is not able to get much exercise.  No cp , no nausea ,  No hematuria , hematachezia, , no rash ,  Has DOE with any exercise - admits that her weight is likely an issue.  Watches her salt intake Eats lots of potatoes and chocolate   .  June 12, 2021: Virginia Cabrera is seen after 3 year absence BP is mildly elevaste  We discussed weight loss Has issues with her R knee,  needs R TKA  Also needs shoulder surgery   Will increase her spiro to 50 mg a day  Has some left sided chest discomfort,  lasts many seconds  Will get a lexiscan myoview    June 09, 2022 Virginia Cabrera is seen for follow up of her HTN No cardiac  issues,  having a breast bx next week .  Some exercise  Limited by knee pain   Was seen for long QT when her potassium was low.  She has done a nice job with keeping her levels within range   No cp    June 09, 2023 Virginia Cabrera is seen for follow up of her HTN Has some DOE Does chair yoga for the past week .  Does not exercise  Echo in 2020 shows normal LV function   She has a compression fracture in her back  I asked about a Kyphoplasty     Past Medical History:  Diagnosis Date   Allergy    Anal fissure    Anxiety    Arthritis    Asthma    Colon polyp, hyperplastic    Depression    GERD (gastroesophageal reflux disease)    Hypertension    IBS (irritable bowel syndrome)    Vitamin deficiency     Past Surgical History:  Procedure Laterality Date   CESAREAN SECTION     TONSILLECTOMY      Current Medications: Current Meds  Medication Sig   albuterol (VENTOLIN HFA) 108 (90 Base) MCG/ACT inhaler INHALE 1 PUFF BY MOUTH EVERY 4 TO 6 HOURS AS NEEDED   amLODipine (NORVASC) 5 MG tablet Take 1 tablet (5 mg total) by mouth daily.   baclofen (LIORESAL) 20 MG  tablet Take 1 tablet (20 mg total) by mouth 3 (three) times daily.   beclomethasone (QVAR REDIHALER) 80 MCG/ACT inhaler Inhale 2 puffs into the lungs 2 (two) times daily.   busPIRone (BUSPAR) 5 MG tablet Take 1 tablet (5 mg total) by mouth 2 (two) times daily.   cholecalciferol (VITAMIN D3) 25 MCG (1000 UNIT) tablet Take 1,000 Units by mouth daily.   clonazePAM (KLONOPIN) 0.5 MG tablet Take 1 tablet (0.5 mg total) by mouth 2 (two) times daily as needed. for anxiety   esomeprazole (NEXIUM) 40 MG capsule Take 1 capsule (40 mg total) by mouth daily at 12 noon.   HYDROcodone-acetaminophen (NORCO) 5-325 MG tablet Take 1 tablet by mouth every 6 (six) hours as needed for moderate pain (pain score 4-6).   hydrOXYzine (ATARAX) 10 MG tablet Take 1 tablet (10 mg total) by mouth 3 (three) times daily as needed.   levocetirizine (XYZAL) 5 MG tablet Take 1 tablet (5 mg total) by mouth every evening.   losartan (COZAAR) 100 MG tablet Take 1 tablet (100 mg total) by mouth daily.   meclizine (ANTIVERT) 25 MG tablet Take 1 tablet (25 mg total) by mouth 3  (three) times daily as needed for dizziness.   spironolactone (ALDACTONE) 25 MG tablet Take 1 tablet (25 mg total) by mouth daily.   traMADol (ULTRAM) 50 MG tablet Take 1 tablet (50 mg total) by mouth every 12 (twelve) hours as needed.   traZODone (DESYREL) 100 MG tablet TK 1 T PO HS PRN   Vitamin D, Ergocalciferol, (DRISDOL) 1.25 MG (50000 UNIT) CAPS capsule Take 1 capsule (50,000 Units total) by mouth every 7 (seven) days.     Allergies:   Morphine and Morphine and codeine   Social History   Socioeconomic History   Marital status: Single    Spouse name: Not on file   Number of children: 1   Years of education: Not on file   Highest education level: 12th grade  Occupational History   Occupation: disabled/retired  Tobacco Use   Smoking status: Former    Current packs/day: 0.00    Average packs/day: 0.2 packs/day for 15.0 years (3.0 ttl pk-yrs)    Types: Cigarettes    Start date: 08/24/1983    Quit date: 08/24/1998    Years since quitting: 24.8   Smokeless tobacco: Never  Vaping Use   Vaping status: Never Used  Substance and Sexual Activity   Alcohol use: Yes    Comment: occssionally   Drug use: No   Sexual activity: Not on file  Other Topics Concern   Not on file  Social History Narrative   Not on file   Social Drivers of Health   Financial Resource Strain: Low Risk  (09/02/2022)   Overall Financial Resource Strain (CARDIA)    Difficulty of Paying Living Expenses: Not hard at all  Food Insecurity: Food Insecurity Present (09/02/2022)   Hunger Vital Sign    Worried About Running Out of Food in the Last Year: Sometimes true    Ran Out of Food in the Last Year: Sometimes true  Transportation Needs: No Transportation Needs (09/02/2022)   PRAPARE - Administrator, Civil Service (Medical): No    Lack of Transportation (Non-Medical): No  Physical Activity: Inactive (09/02/2022)   Exercise Vital Sign    Days of Exercise per Week: 0 days    Minutes of Exercise per  Session: 0 min  Stress: Stress Concern Present (09/02/2022)   [employer] of Occupational  Health - Occupational Stress Questionnaire    Feeling of Stress : To some extent  Social Connections: Socially Isolated (09/02/2022)   Social Connection and Isolation Panel [NHANES]    Frequency of Communication with Friends and Family: More than three times a week    Frequency of Social Gatherings with Friends and Family: More than three times a week    Attends Religious Services: Never    Database administrator or Organizations: No    Attends Engineer, structural: Never    Marital Status: Never married     Family History: The patient's family history includes Brain cancer in her father; Breast cancer in her maternal aunt and mother; Colon cancer in an other family member; Colon polyps in her sister; Diabetes in her brother; Emphysema in her maternal grandmother; Esophageal cancer in an other family member; Heart attack in her maternal aunt; Heart disease in her maternal grandfather; Hypertension in her brother, mother, sister, and sister; Kidney disease in her brother; Leukemia in her maternal aunt; Melanoma in her mother; Pancreatic cancer in an other family member; Post-traumatic stress disorder in her brother; Stomach cancer in an other family member; Stroke in her maternal aunt.  ROS:   Please see the history of present illness.     All other systems reviewed and are negative.  EKGs/Labs/Other Studies Reviewed:    The following studies were reviewed today:   EKG:    EKG Interpretation Date/Time:  Tuesday June 09 2023 10:30:47 EDT Ventricular Rate:  66 PR Interval:  158 QRS Duration:  80 QT Interval:  404 QTC Calculation: 423 R Axis:   -1  Text Interpretation: Normal sinus rhythm Low voltage QRS Cannot rule out Anterior infarct , age undetermined , likely lead placement abnormality No previous ECGs available Confirmed by Kristeen Miss [phone]) on 06/09/2023 10:51:23 AM      Recent Labs: 12/30/2022: ALT 21; BUN 19; Creatinine, Ser 1.18; Hemoglobin 13.3; Platelets 247.0; Potassium 4.2; Sodium 139; TSH 0.85  Recent Lipid Panel    Component Value Date/Time   CHOL 208 (H) 03/17/2022 1453   CHOL 204 (H) 07/05/2019 1229   TRIG 208.0 (H) 03/17/2022 1453   HDL 53.30 03/17/2022 1453   HDL 49 07/05/2019 1229   CHOLHDL 4 03/17/2022 1453   VLDL 41.6 (H) 03/17/2022 1453   LDLCALC 116 (H) 09/24/2021 1217   LDLCALC 113 (H) 07/05/2019 1229   LDLDIRECT 112.0 03/17/2022 1453    Physical Exam:     Physical Exam: Blood pressure 120/84, pulse 72, height 5' 3.5" (1.613 m), weight 212 lb 6.4 oz (96.3 kg), SpO2 99%.       GEN:  Well nourished, well developed in no acute distress HEENT: Normal NECK: No JVD; No carotid bruits LYMPHATICS: No lymphadenopathy CARDIAC: RRR , no murmurs, rubs, gallops RESPIRATORY:  Clear to auscultation without rales, wheezing or rhonchi  ABDOMEN: Soft, non-tender, non-distended MUSCULOSKELETAL:  No edema; No deformity  SKIN: Warm and dry NEUROLOGIC:  Alert and oriented x 3    ASSESSMENT:    1. Essential hypertension       PLAN:     HTN:  BP is fairly well controlled.  Advised more exercise , weight loss    2.  Dyspnea on exertion:     I suspect she is very deconditioned. Encouraged her to work on more exercise  3.  Compression fracture :  advised her to ask about kyphoplasty .      Medication Adjustments/Labs and Tests Ordered: Current medicines  are reviewed at length with the patient today.  Concerns regarding medicines are outlined above.  Orders Placed This Encounter  Procedures   EKG 12-Lead   No orders of the defined types were placed in this encounter.    Patient Instructions  Follow-Up: At [facility], you and your health needs are our priority.  As part of our continuing mission to provide you with exceptional heart care, our providers are all part of one team.  This team includes your  primary Cardiologist (physician) and Advanced Practice Providers or APPs (Physician Assistants and Nurse Practitioners) who all work together to provide you with the care you need, when you need it.  Your next appointment:   1 year(s)  Provider:   Kristeen Miss, MD     Other Instructions     1st Floor: - Lobby - Registration  - Pharmacy  - Lab - Cafe  2nd Floor: - PV Lab - Diagnostic Testing (echo, CT, nuclear med)  3rd Floor: - Vacant  4th Floor: - TCTS (cardiothoracic surgery) - AFib Clinic - Structural Heart Clinic - Vascular Surgery  - Vascular Ultrasound  5th Floor: - HeartCare Cardiology (general and EP) - Clinical Pharmacy for coumadin, hypertension, lipid, weight-loss medications, and med management appointments    Valet parking services will be available as well.     Signed, Kristeen Miss, MD  06/09/2023 10:51 AM    [facility]

## 2023-06-09 ENCOUNTER — Ambulatory Visit: Payer: Medicare Other | Attending: Cardiovascular Disease | Admitting: Cardiovascular Disease

## 2023-06-09 ENCOUNTER — Encounter: Payer: Self-pay | Admitting: Cardiovascular Disease

## 2023-06-09 VITALS — BP 120/84 | HR 72 | Ht 63.5 in | Wt 212.4 lb

## 2023-06-09 DIAGNOSIS — I10 Essential (primary) hypertension: Secondary | ICD-10-CM

## 2023-06-09 NOTE — Patient Instructions (Signed)
 Follow-Up: At [facility], you and your health needs are our priority.  As part of our continuing mission to provide you with exceptional heart care, our providers are all part of one team.  This team includes your primary Cardiologist (physician) and Advanced Practice Providers or APPs (Physician Assistants and Nurse Practitioners) who all work together to provide you with the care you need, when you need it.  Your next appointment:   1 year(s)  Provider:   Kristeen Miss, MD     Other Instructions     1st Floor: - Lobby - Registration  - Pharmacy  - Lab - Cafe  2nd Floor: - PV Lab - Diagnostic Testing (echo, CT, nuclear med)  3rd Floor: - Vacant  4th Floor: - TCTS (cardiothoracic surgery) - AFib Clinic - Structural Heart Clinic - Vascular Surgery  - Vascular Ultrasound  5th Floor: - HeartCare Cardiology (general and EP) - Clinical Pharmacy for coumadin, hypertension, lipid, weight-loss medications, and med management appointments    Valet parking services will be available as well.

## 2023-06-24 ENCOUNTER — Other Ambulatory Visit: Payer: Self-pay | Admitting: Cardiovascular Disease

## 2023-06-24 DIAGNOSIS — I10 Essential (primary) hypertension: Secondary | ICD-10-CM

## 2023-06-24 DIAGNOSIS — Z1231 Encounter for screening mammogram for malignant neoplasm of breast: Secondary | ICD-10-CM | POA: Diagnosis not present

## 2023-06-24 LAB — HM MAMMOGRAPHY

## 2023-07-31 ENCOUNTER — Other Ambulatory Visit: Payer: Self-pay | Admitting: Physician Assistant

## 2023-07-31 NOTE — Telephone Encounter (Signed)
 Copied from CRM [id number]. Topic: Clinical - Medication Refill >> Jul 31, 2023 10:51 AM Dorisann Garre T wrote: Medication: HYDROcodone -acetaminophen  (NORCO) 5-325 MG tablet [[id number]]  Has the patient contacted their pharmacy? Yes (Agent: If no, request that the patient contact the pharmacy for the refill. If patient does not wish to contact the pharmacy document the reason why and proceed with request.) (Agent: If yes, when and what did the pharmacy advise?)  This is the patient's preferred pharmacy:  [facility] [address] ([address]), [place] - [address] [place] ([place]) [place] [address] Phone: [phone] Fax: [phone]  Is this the correct pharmacy for this prescription? Yes If no, delete pharmacy and type the correct one.   Has the prescription been filled recently? No  Is the patient out of the medication? Yes  Has the patient been seen for an appointment in the last year OR does the patient have an upcoming appointment? Yes  Can we respond through MyChart? Yes  Agent: Please be advised that Rx refills may take up to 3 business days. We ask that you follow-up with your pharmacy.

## 2023-07-31 NOTE — Telephone Encounter (Signed)
 Patient not seen since Oct 2024, unable to deny refill for needing appt

## 2023-08-04 ENCOUNTER — Other Ambulatory Visit: Payer: Self-pay | Admitting: Physician Assistant

## 2023-08-04 NOTE — Telephone Encounter (Signed)
 Copied from CRM [id number]. Topic: Clinical - Medication Refill >> Aug 04, 2023 12:17 PM Emmet Harm C wrote: Medication: HYDROcodone -acetaminophen  (NORCO) 5-325 MG tablet   Has the patient contacted their pharmacy? Yes (Agent: If no, request that the patient contact the pharmacy for the refill. If patient does not wish to contact the pharmacy document the reason why and proceed with request.) (Agent: If yes, when and what did the pharmacy advise?)  This is the patient's preferred pharmacy:  [facility] [address] ([address] [place] - [address] [place] ([place]) [place] [address] Phone: [phone] Fax: [phone]  Is this the correct pharmacy for this prescription? Yes If no, delete pharmacy and type the correct one.   Has the prescription been filled recently? No  Is the patient out of the medication? No 3 pills left  Has the patient been seen for an appointment in the last year OR does the patient have an upcoming appointment? Yes  Can we respond through MyChart? No  Agent: Please be advised that Rx refills may take up to 3 business days. We ask that you follow-up with your pharmacy.

## 2023-08-10 ENCOUNTER — Ambulatory Visit (INDEPENDENT_AMBULATORY_CARE_PROVIDER_SITE_OTHER): Admitting: Physician Assistant

## 2023-08-10 ENCOUNTER — Encounter: Payer: Self-pay | Admitting: Physician Assistant

## 2023-08-10 VITALS — BP 126/80 | HR 81 | Temp 97.5°F | Ht 63.5 in | Wt 212.0 lb

## 2023-08-10 DIAGNOSIS — M1712 Unilateral primary osteoarthritis, left knee: Secondary | ICD-10-CM

## 2023-08-10 DIAGNOSIS — F419 Anxiety disorder, unspecified: Secondary | ICD-10-CM | POA: Diagnosis not present

## 2023-08-10 DIAGNOSIS — I10 Essential (primary) hypertension: Secondary | ICD-10-CM

## 2023-08-10 DIAGNOSIS — F32A Depression, unspecified: Secondary | ICD-10-CM | POA: Diagnosis not present

## 2023-08-10 DIAGNOSIS — F411 Generalized anxiety disorder: Secondary | ICD-10-CM

## 2023-08-10 DIAGNOSIS — F5101 Primary insomnia: Secondary | ICD-10-CM | POA: Diagnosis not present

## 2023-08-10 LAB — CBC WITH DIFFERENTIAL/PLATELET
Basophils Absolute: 0 10*3/uL (ref 0.0–0.1)
Basophils Relative: 0.3 % (ref 0.0–3.0)
Eosinophils Absolute: 0.1 10*3/uL (ref 0.0–0.7)
Eosinophils Relative: 1.2 % (ref 0.0–5.0)
HCT: 39.8 % (ref 36.0–46.0)
Hemoglobin: 13.3 g/dL (ref 12.0–15.0)
Lymphocytes Relative: 21.5 % (ref 12.0–46.0)
Lymphs Abs: 1.8 10*3/uL (ref 0.7–4.0)
MCHC: 33.5 g/dL (ref 30.0–36.0)
MCV: 91.4 fl (ref 78.0–100.0)
Monocytes Absolute: 0.7 10*3/uL (ref 0.1–1.0)
Monocytes Relative: 8.8 % (ref 3.0–12.0)
Neutro Abs: 5.7 10*3/uL (ref 1.4–7.7)
Neutrophils Relative %: 68.2 % (ref 43.0–77.0)
Platelets: 230 10*3/uL (ref 150.0–400.0)
RBC: 4.35 Mil/uL (ref 3.87–5.11)
RDW: 13.2 % (ref 11.5–15.5)
WBC: 8.3 10*3/uL (ref 4.0–10.5)

## 2023-08-10 LAB — COMPREHENSIVE METABOLIC PANEL WITH GFR
ALT: 14 U/L (ref 0–35)
AST: 18 U/L (ref 0–37)
Albumin: 4.3 g/dL (ref 3.5–5.2)
Alkaline Phosphatase: 59 U/L (ref 39–117)
BUN: 23 mg/dL (ref 6–23)
CO2: 25 meq/L (ref 19–32)
Calcium: 9.6 mg/dL (ref 8.4–10.5)
Chloride: 104 meq/L (ref 96–112)
Creatinine, Ser: 1.16 mg/dL (ref 0.40–1.20)
GFR: 49.48 mL/min — ABNORMAL LOW (ref >60.00)
Glucose, Bld: 90 mg/dL (ref 70–99)
Potassium: 3.9 meq/L (ref 3.5–5.1)
Sodium: 139 meq/L (ref 135–145)
Total Bilirubin: 0.4 mg/dL (ref 0.2–1.2)
Total Protein: 7.2 g/dL (ref 6.0–8.3)

## 2023-08-10 LAB — LIPID PANEL
Cholesterol: 197 mg/dL (ref 0–200)
HDL: 63.2 mg/dL
LDL Cholesterol: 101 mg/dL — ABNORMAL HIGH (ref 0–99)
NonHDL: 133.69
Total CHOL/HDL Ratio: 3
Triglycerides: 162 mg/dL — ABNORMAL HIGH (ref 0.0–149.0)
VLDL: 32.4 mg/dL (ref 0.0–40.0)

## 2023-08-10 MED ORDER — CLONAZEPAM 0.5 MG PO TABS: 0.5000 mg | ORAL_TABLET | Freq: Two times a day (BID) | ORAL | 0 refills | Status: AC | PRN

## 2023-08-10 MED ORDER — HYDROCODONE-ACETAMINOPHEN 5-325 MG PO TABS: 1.0000 | ORAL_TABLET | Freq: Four times a day (QID) | ORAL | 0 refills | Status: DC | PRN
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
## 2023-08-12 ENCOUNTER — Ambulatory Visit: Payer: Self-pay | Admitting: Physician Assistant

## 2023-08-31 ENCOUNTER — Other Ambulatory Visit: Payer: Self-pay | Admitting: Physician Assistant

## 2023-08-31 MED ORDER — HYDROCODONE-ACETAMINOPHEN 5-325 MG PO TABS: 1.0000 | ORAL_TABLET | Freq: Four times a day (QID) | ORAL | 0 refills | Status: DC | PRN

## 2023-08-31 NOTE — Telephone Encounter (Signed)
 Pt requesting refill for Hydrocodone  5-325 mg. Last OV 08/10/2023.

## 2023-08-31 NOTE — Telephone Encounter (Unsigned)
 Copied from CRM [id number]. Topic: Clinical - Medication Refill >> Aug 31, 2023 11:56 AM [place] E wrote: Medication: HYDROcodone -acetaminophen  (NORCO) 5-325 MG tablet  Has the patient contacted their pharmacy? Yes (Agent: If no, request that the patient contact the pharmacy for the refill. If patient does not wish to contact the pharmacy document the reason why and proceed with request.) (Agent: If yes, when and what did the pharmacy advise?)  This is the patient's preferred pharmacy:  [facility] [address] ([address]), [place] - [address] [place] ([place]) [place] [address] Phone: [phone] Fax: [phone]  Is this the correct pharmacy for this prescription? Yes If no, delete pharmacy and type the correct one.   Has the prescription been filled recently? Yes  Is the patient out of the medication? No, 2 pills left.  Has the patient been seen for an appointment in the last year OR does the patient have an upcoming appointment? Yes  Can we respond through MyChart? Yes  Agent: Please be advised that Rx refills may take up to 3 business days. We ask that you follow-up with your pharmacy.

## 2023-09-03 ENCOUNTER — Ambulatory Visit

## 2023-09-14 ENCOUNTER — Ambulatory Visit (INDEPENDENT_AMBULATORY_CARE_PROVIDER_SITE_OTHER): Payer: Medicare Other

## 2023-09-14 VITALS — Ht 62.0 in | Wt 212.0 lb

## 2023-09-14 DIAGNOSIS — Z0000 Encounter for general adult medical examination without abnormal findings: Secondary | ICD-10-CM | POA: Diagnosis not present

## 2023-09-14 DIAGNOSIS — E2839 Other primary ovarian failure: Secondary | ICD-10-CM

## 2023-09-14 NOTE — Progress Notes (Signed)
 Subjective:   Virginia Cabrera is a 68 y.o. who presents for a Medicare Wellness preventive visit.  As a reminder, Annual Wellness Visits don't include a physical exam, and some assessments may be limited, especially if this visit is performed virtually. We may recommend an in-person follow-up visit with your provider if needed.  Visit Complete: Virtual I connected with  Virginia Cabrera on 09/14/23 by a audio enabled telemedicine application and verified that I am speaking with the correct person using two identifiers.  Patient Location: Home  Provider Location: Office/Clinic  I discussed the limitations of evaluation and management by telemedicine. The patient expressed understanding and agreed to proceed.  Vital Signs: Because this visit was a virtual/telehealth visit, some criteria may be missing or patient reported. Any vitals not documented were not able to be obtained and vitals that have been documented are patient reported.  VideoDeclined- This patient declined Librarian, academic. Therefore the visit was completed with audio only.  Persons Participating in Visit: Patient.  AWV Questionnaire: No: Patient Medicare AWV questionnaire was not completed prior to this visit.  Cardiac Risk Factors include: advanced age (>11men, >63 women);hypertension;obesity (BMI >30kg/m2)     Objective:    Today's Vitals   09/14/23 0841  Weight: 212 lb (96.2 kg)  Height: 5' 2 (1.575 m)   Body mass index is 38.78 kg/m.     09/14/2023    8:51 AM 01/12/2023   12:17 PM 09/02/2022    9:11 AM  Advanced Directives  Does Patient Have a Medical Advance Directive? Yes No No  Type of Estate agent of [place];Living will    Copy of Healthcare Power of Attorney in Chart? No - copy requested    Would patient like information on creating a medical advance directive?  No - Patient declined No - Patient declined    Current Medications  (verified) Outpatient Encounter Medications as of 09/14/2023  Medication Sig   albuterol  (VENTOLIN  HFA) 108 (90 Base) MCG/ACT inhaler INHALE 1 PUFF BY MOUTH EVERY 4 TO 6 HOURS AS NEEDED   amLODipine  (NORVASC ) 5 MG tablet Take 1 tablet by mouth once daily   baclofen  (LIORESAL ) 20 MG tablet Take 1 tablet (20 mg total) by mouth 3 (three) times daily.   beclomethasone (QVAR  REDIHALER) 80 MCG/ACT inhaler Inhale 2 puffs into the lungs 2 (two) times daily.   busPIRone  (BUSPAR ) 5 MG tablet Take 1 tablet (5 mg total) by mouth 2 (two) times daily.   cholecalciferol (VITAMIN D3) 25 MCG (1000 UNIT) tablet Take 1,000 Units by mouth daily.   clonazePAM  (KLONOPIN ) 0.5 MG tablet Take 1 tablet (0.5 mg total) by mouth 2 (two) times daily as needed. for anxiety   esomeprazole  (NEXIUM ) 40 MG capsule Take 1 capsule (40 mg total) by mouth daily at 12 noon.   HYDROcodone -acetaminophen  (NORCO) 5-325 MG tablet Take 1 tablet by mouth every 6 (six) hours as needed for moderate pain (pain score 4-6).   levocetirizine (XYZAL ) 5 MG tablet Take 1 tablet (5 mg total) by mouth every evening.   losartan  (COZAAR ) 100 MG tablet Take 1 tablet (100 mg total) by mouth daily.   meclizine  (ANTIVERT ) 25 MG tablet Take 1 tablet (25 mg total) by mouth 3 (three) times daily as needed for dizziness.   spironolactone  (ALDACTONE ) 25 MG tablet Take 1 tablet by mouth once daily   traMADol  (ULTRAM ) 50 MG tablet Take 1 tablet (50 mg total) by mouth every 12 (twelve) hours as needed.  traZODone (DESYREL) 100 MG tablet TK 1 T PO HS PRN   No facility-administered encounter medications on file as of 09/14/2023.    Allergies (verified) Morphine and Morphine and codeine   History: Past Medical History:  Diagnosis Date   Allergy    Anal fissure    Anxiety    Arthritis    Asthma    Colon polyp, hyperplastic    Depression    GERD (gastroesophageal reflux disease)    Hypertension    IBS (irritable bowel syndrome)    Vitamin deficiency     Past Surgical History:  Procedure Laterality Date   CESAREAN SECTION     TONSILLECTOMY     Family History  Problem Relation Age of Onset   Breast cancer Mother    Hypertension Mother    Melanoma Mother    Brain cancer Father    Hypertension Sister    Colon polyps Sister    Hypertension Sister    Diabetes Brother    Kidney disease Brother    Hypertension Brother    Post-traumatic stress disorder Brother        war   Emphysema Maternal Grandmother    Heart disease Maternal Grandfather    Breast cancer Maternal Aunt    Stroke Maternal Aunt    Heart attack Maternal Aunt    Leukemia Maternal Aunt    Colon cancer Other    Esophageal cancer Other    Stomach cancer Other    Pancreatic cancer Other    Social History   Socioeconomic History   Marital status: Single    Spouse name: Not on file   Number of children: 1   Years of education: Not on file   Highest education level: 12th grade  Occupational History   Occupation: disabled/retired  Tobacco Use   Smoking status: Former    Current packs/day: 0.00    Average packs/day: 0.2 packs/day for 15.0 years (3.0 ttl pk-yrs)    Types: Cigarettes    Start date: 08/24/1983    Quit date: 08/24/1998    Years since quitting: 25.0   Smokeless tobacco: Never  Vaping Use   Vaping status: Never Used  Substance and Sexual Activity   Alcohol use: Yes    Comment: occssionally   Drug use: No   Sexual activity: Not on file  Other Topics Concern   Not on file  Social History Narrative   Not on file   Social Drivers of Health   Financial Resource Strain: High Risk (09/14/2023)   Overall Financial Resource Strain (CARDIA)    Difficulty of Paying Living Expenses: Hard  Food Insecurity: Food Insecurity Present (09/14/2023)   Hunger Vital Sign    Worried About Running Out of Food in the Last Year: Sometimes true    Ran Out of Food in the Last Year: Sometimes true  Transportation Needs: No Transportation Needs (09/14/2023)   PRAPARE -  Administrator, Civil Service (Medical): No    Lack of Transportation (Non-Medical): No  Physical Activity: Inactive (09/14/2023)   Exercise Vital Sign    Days of Exercise per Week: 0 days    Minutes of Exercise per Session: 0 min  Stress: Stress Concern Present (09/14/2023)   [employer] of Occupational Health - Occupational Stress Questionnaire    Feeling of Stress: To some extent  Social Connections: Socially Isolated (09/14/2023)   Social Connection and Isolation Panel    Frequency of Communication with Friends and Family: More than three times a week  Frequency of Social Gatherings with Friends and Family: Three times a week    Attends Religious Services: Never    Active Member of Clubs or Organizations: No    Attends Banker Meetings: Never    Marital Status: Never married    Tobacco Counseling Counseling given: Not Answered    Clinical Intake:  Pre-visit preparation completed: Yes  Pain : No/denies pain     BMI - recorded: 38.78 Nutritional Status: BMI > 30  Obese Nutritional Risks: None Diabetes: No  Lab Results  Component Value Date   HGBA1C 5.5 09/24/2021   HGBA1C 5.6 06/19/2020   HGBA1C 5.5 07/05/2019     How often do you need to have someone help you when you read instructions, pamphlets, or other written materials from your doctor or pharmacy?: 1 - Never  Interpreter Needed?: No  Information entered by :: Ellouise Haws, LPN   Activities of Daily Living     09/14/2023    8:42 AM  In your present state of health, do you have any difficulty performing the following activities:  Hearing? 0  Vision? 0  Difficulty concentrating or making decisions? 0  Walking or climbing stairs? 1  Comment knee and back pain  Dressing or bathing? 0  Doing errands, shopping? 0  Preparing Food and eating ? N  Using the Toilet? N  In the past six months, have you accidently leaked urine? N  Do you have problems with loss of bowel control?  N  Managing your Medications? N  Managing your Finances? N  Housekeeping or managing your Housekeeping? N    Patient Care Team: Job Lukes, [place] as PCP - General (Physician Assistant) Nahser, Aleene PARAS, MD as PCP - Cardiology (Cardiology)  I have updated your Care Teams any recent Medical Services you may have received from other providers in the past year.     Assessment:   This is a routine wellness examination for Virginia Cabrera.  Hearing/Vision screen Hearing Screening - Comments:: Pt denies any hearing issues  Vision Screening - Comments:: Wears rx glasses - [facility]     Goals Addressed             This Visit's Progress    Patient Stated       Weight loss        Depression Screen     09/14/2023    8:43 AM 08/10/2023    1:53 PM 12/30/2022   11:13 AM 10/01/2022    9:23 AM 09/23/2022   10:36 AM 09/02/2022   10:55 AM 09/02/2022    9:07 AM  PHQ 2/9 Scores  PHQ - 2 Score 5 4 4 4 4 2 1   PHQ- 9 Score 12 15 11 14 8 7      Fall Risk     09/14/2023    8:52 AM 12/30/2022   11:11 AM 09/02/2022    9:12 AM 10/09/2021   12:03 PM 09/24/2021   10:55 AM  Fall Risk   Falls in the past year? 1 1 1  0 0  Number falls in past yr: 1 1 1  0 0  Injury with Fall? 0 0 0 0 0  Risk for fall due to : History of fall(s) Impaired balance/gait Impaired balance/gait;Impaired vision No Fall Risks No Fall Risks  Follow up Falls prevention discussed Falls evaluation completed Falls prevention discussed Falls evaluation completed  Falls evaluation completed      Data saved with a previous  flowsheet row definition    MEDICARE RISK AT HOME:  Medicare Risk at Home Any stairs in or around the home?: No If so, are there any without handrails?: No Home free of loose throw rugs in walkways, pet beds, electrical cords, etc?: Yes Adequate lighting in your home to reduce risk of falls?: Yes Life alert?: No Use of a cane, walker or w/c?: No Grab bars in the bathroom?:  No Shower chair or bench in shower?: No Elevated toilet seat or a handicapped toilet?: No  TIMED UP AND GO:  Was the test performed?  No  Cognitive Function: 6CIT completed        09/14/2023    8:52 AM 09/02/2022    9:14 AM  6CIT Screen  What Year? 0 points 0 points  What month? 0 points 0 points  What time? 0 points 0 points  Count back from 20 0 points 0 points  Months in reverse 0 points 0 points  Repeat phrase 2 points 0 points  Total Score 2 points 0 points    Immunizations  There is no immunization history on file for this patient.  Screening Tests Health Maintenance  Topic Date Due   Pneumococcal Vaccine: 50+ Years (1 of 2 - PCV) Never done   Zoster Vaccines- Shingrix (1 of 2) Never done   DEXA SCAN  Never done   Cervical Cancer Screening (HPV/Pap Cotest)  10/12/2023   DTaP/Tdap/Td (1 - Tdap) 12/30/2023 (Originally 02/[birth year]/1977)   INFLUENZA VACCINE  10/09/2023   Medicare Annual Wellness (AWV)  09/13/2024   MAMMOGRAM  06/23/2025   Fecal DNA (Cologuard)  01/07/2026   Hepatitis C Screening  Completed   HIV Screening  Completed   Hepatitis B Vaccines  Aged Out   HPV VACCINES  Aged Out   Meningococcal B Vaccine  Aged Out   COVID-19 Vaccine  Discontinued    Health Maintenance  Health Maintenance Due  Topic Date Due   Pneumococcal Vaccine: 50+ Years (1 of 2 - PCV) Never done   Zoster Vaccines- Shingrix (1 of 2) Never done   DEXA SCAN  Never done   Cervical Cancer Screening (HPV/Pap Cotest)  10/12/2023   Health Maintenance Items Addressed: See Nurse Notes at the end of this note  Additional Screening:  Vision Screening: Recommended annual ophthalmology exams for early detection of glaucoma and other disorders of the eye. Would you like a referral to an eye doctor? No    Dental Screening: Recommended annual dental exams for proper oral hygiene  Community Resource Referral / Chronic Care Management: CRR required this visit?  Yes   CCM required this  visit?  No   Plan:    I have personally reviewed and noted the following in the patient's chart:   Medical and social history Use of alcohol, tobacco or illicit drugs  Current medications and supplements including opioid prescriptions. Patient is currently taking opioid prescriptions. Information provided to patient regarding non-opioid alternatives. Patient advised to discuss non-opioid treatment plan with their provider. Functional ability and status Nutritional status Physical activity Advanced directives List of other physicians Hospitalizations, surgeries, and ER visits in previous 12 months Vitals Screenings to include cognitive, depression, and falls Referrals and appointments  In addition, I have reviewed and discussed with patient certain preventive protocols, quality metrics, and best practice recommendations. A written personalized care plan for preventive services as well as general preventive health recommendations were provided to patient.   Ellouise VEAR Haws, LPN   04/11/7972   After  Visit Summary: (MyChart) Due to this being a telephonic visit, the after visit summary with patients personalized plan was offered to patient via MyChart   Notes: Nothing significant to report at this time.

## 2023-09-14 NOTE — Patient Instructions (Signed)
 Virginia Cabrera , Thank you for taking time out of your busy schedule to complete your Annual Wellness Visit with me. I enjoyed our conversation and look forward to speaking with you again next year. I, as well as your care team,  appreciate your ongoing commitment to your health goals. Please review the following plan we discussed and let me know if I can assist you in the future. Your Game plan/ To Do List    Referrals: If you haven't heard from the office you've been referred to, please reach out to them at the phone provided.   Follow up Visits: Next Medicare AWV with our clinical staff: 09/15/24   Have you seen your provider in the last 6 months (3 months if uncontrolled diabetes)? Yes Next Office Visit with your provider: 11/06/23  Clinician Recommendations:  Aim for 30 minutes of exercise or brisk walking, 6-8 glasses of water, and 5 servings of fruits and vegetables each day.       This is a list of the screening recommended for you and due dates:  Health Maintenance  Topic Date Due   Pneumococcal Vaccine for age over 8 (1 of 2 - PCV) Never done   Zoster (Shingles) Vaccine (1 of 2) Never done   DEXA scan (bone density measurement)  Never done   Medicare Annual Wellness Visit  09/02/2023   Pap with HPV screening  10/12/2023   DTaP/Tdap/Td vaccine (1 - Tdap) 12/30/2023*   Flu Shot  10/09/2023   Mammogram  06/23/2025   Cologuard (Stool DNA test)  01/07/2026   Hepatitis C Screening  Completed   HIV Screening  Completed   Hepatitis B Vaccine  Aged Out   HPV Vaccine  Aged Out   Meningitis B Vaccine  Aged Out   COVID-19 Vaccine  Discontinued  *Topic was postponed. The date shown is not the original due date.    Advanced directives: (Copy Requested) Please bring a copy of your health care power of attorney and living will to the office to be added to your chart at your convenience. You can mail to [facility] [address] [place], [place] [address] or email to  [email or web address] Advance Care Planning is important because it:  [x]  Makes sure you receive the medical care that is consistent with your values, goals, and preferences  [x]  It provides guidance to your family and loved ones and reduces their decisional burden about whether or not they are making the right decisions based on your wishes.  Follow the link provided in your after visit summary or read over the paperwork we have mailed to you to help you started getting your Advance Directives in place. If you need assistance in completing these, please reach out to us  so that we can help you!  See attachments for Preventive Care and Fall Prevention Tips.

## 2023-09-28 ENCOUNTER — Telehealth: Payer: Self-pay | Admitting: *Deleted

## 2023-09-28 NOTE — Progress Notes (Unsigned)
 Complex Care Management Note Care Guide Note  09/28/2023 Name: Virginia Cabrera MRN: [id number] DOB: 25-Aug-1955   Complex Care Management Outreach Attempts: An unsuccessful telephone outreach was attempted today to offer the patient information about available complex care management services.  Follow Up Plan:  Additional outreach attempts will be made to offer the patient complex care management information and services.   Encounter Outcome:  No Answer  Thedford Franks, CMA [place]  [facility], [facility] Guide Direct Dial: [phone]  Fax: [phone] Website: [email or web address]

## 2023-09-29 NOTE — Progress Notes (Unsigned)
 Complex Care Management Note Care Guide Note  09/29/2023 Name: Virginia Cabrera MRN: [id number] DOB: 1955/09/15   Complex Care Management Outreach Attempts: A second unsuccessful outreach was attempted today to offer the patient with information about available complex care management services.  Follow Up Plan:  Additional outreach attempts will be made to offer the patient complex care management information and services.   Encounter Outcome:  No Answer  Thedford Franks, CMA [place]  [facility], [facility] Guide Direct Dial: [phone]  Fax: [phone] Website: [email or web address]

## 2023-09-30 NOTE — Progress Notes (Signed)
 Complex Care Management Note Care Guide Note  09/30/2023 Name: Virginia Cabrera MRN: [id number] DOB: Oct 22, 1955   Complex Care Management Outreach Attempts: A third unsuccessful outreach was attempted today to offer the patient with information about available complex care management services.  Follow Up Plan:  No further outreach attempts will be made at this time. We have been unable to contact the patient to offer or enroll patient in complex care management services.  Encounter Outcome:  No Answer  Thedford Franks, CMA [place]  [facility], [facility] Guide Direct Dial: [phone]  Fax: [phone] Website: [email or web address]

## 2023-10-01 ENCOUNTER — Other Ambulatory Visit: Payer: Self-pay | Admitting: Physician Assistant

## 2023-10-01 MED ORDER — HYDROCODONE-ACETAMINOPHEN 5-325 MG PO TABS: 1.0000 | ORAL_TABLET | Freq: Four times a day (QID) | ORAL | 0 refills | Status: DC | PRN

## 2023-10-01 NOTE — Telephone Encounter (Signed)
 Pt requesting refill for Hydrocodone  5-325 mg. Last OV 08/10/2023.

## 2023-10-01 NOTE — Telephone Encounter (Signed)
 Last OV: 08/10/2023

## 2023-10-01 NOTE — Telephone Encounter (Unsigned)
 Copied from CRM #[id number]. Topic: Clinical - Medication Refill >> Oct 01, 2023  9:59 AM Abigail D wrote: Medication: HYDROcodone -acetaminophen  (NORCO) 5-325 MG tablet  Has the patient contacted their pharmacy? Yes, told to reach out to provider. (Agent: If no, request that the patient contact the pharmacy for the refill. If patient does not wish to contact the pharmacy document the reason why and proceed with request.) (Agent: If yes, when and what did the pharmacy advise?)  This is the patient's preferred pharmacy:  [facility] [address] ([address] [place] - [address] [place] ([place]) [place] [address] Phone: [phone] Fax: [phone]  Is this the correct pharmacy for this prescription? Yes If no, delete pharmacy and type the correct one.   Has the prescription been filled recently? Yes  Is the patient out of the medication? No  Has the patient been seen for an appointment in the last year OR does the patient have an upcoming appointment? Yes  Can we respond through MyChart? Yes  Agent: Please be advised that Rx refills may take up to 3 business days. We ask that you follow-up with your pharmacy.

## 2023-11-02 ENCOUNTER — Other Ambulatory Visit: Payer: Self-pay | Admitting: Physician Assistant

## 2023-11-02 MED ORDER — HYDROCODONE-ACETAMINOPHEN 5-325 MG PO TABS: 1.0000 | ORAL_TABLET | Freq: Four times a day (QID) | ORAL | 0 refills | Status: DC | PRN

## 2023-11-02 NOTE — Telephone Encounter (Unsigned)
 Copied from CRM #[id number]. Topic: Clinical - Medication Refill >> Nov 02, 2023  9:43 AM Precious C wrote: Medication: HYDROcodone -acetaminophen  (NORCO) 5-325 MG tablet  Has the patient contacted their pharmacy? Yes (Agent: If no, request that the patient contact the pharmacy for the refill. If patient does not wish to contact the pharmacy document the reason why and proceed with request.) (Agent: If yes, when and what did the pharmacy advise?)  This is the patient's preferred pharmacy:  [facility] [address] ([address]  - [address] [place] ([place]) [place] [address] Phone: [phone] Fax: [phone]  Is this the correct pharmacy for this prescription? Yes If no, delete pharmacy and type the correct one.   Has the prescription been filled recently? No  Is the patient out of the medication? Yes  Has the patient been seen for an appointment in the last year OR does the patient have an upcoming appointment? Yes  Can we respond through MyChart? Yes  Agent: Please be advised that Rx refills may take up to 3 business days. We ask that you follow-up with your pharmacy.

## 2023-11-02 NOTE — Telephone Encounter (Signed)
 Pt requesting refill for Norco 5-325 mg. Last OV 08/10/2023.

## 2023-11-05 DIAGNOSIS — M1711 Unilateral primary osteoarthritis, right knee: Secondary | ICD-10-CM | POA: Diagnosis not present

## 2023-11-05 DIAGNOSIS — M25562 Pain in left knee: Secondary | ICD-10-CM | POA: Diagnosis not present

## 2023-11-06 ENCOUNTER — Ambulatory Visit: Admitting: Physician Assistant

## 2023-11-06 ENCOUNTER — Encounter: Payer: Self-pay | Admitting: Physician Assistant

## 2023-11-06 ENCOUNTER — Other Ambulatory Visit (HOSPITAL_COMMUNITY)
Admission: RE | Admit: 2023-11-06 | Discharge: 2023-11-06 | Disposition: A | Discharge status: Home / Self Care | Source: Ambulatory Visit | Attending: Physician Assistant | Admitting: Physician Assistant

## 2023-11-06 VITALS — BP 138/80 | HR 77 | Temp 98.1°F | Ht 62.0 in | Wt 214.0 lb

## 2023-11-06 DIAGNOSIS — Z13220 Encounter for screening for lipoid disorders: Secondary | ICD-10-CM

## 2023-11-06 DIAGNOSIS — F32A Depression, unspecified: Secondary | ICD-10-CM

## 2023-11-06 DIAGNOSIS — D229 Melanocytic nevi, unspecified: Secondary | ICD-10-CM | POA: Insufficient documentation

## 2023-11-06 DIAGNOSIS — R0602 Shortness of breath: Secondary | ICD-10-CM

## 2023-11-06 DIAGNOSIS — Z6839 Body mass index (BMI) 39.0-39.9, adult: Secondary | ICD-10-CM | POA: Diagnosis not present

## 2023-11-06 DIAGNOSIS — Z131 Encounter for screening for diabetes mellitus: Secondary | ICD-10-CM | POA: Diagnosis not present

## 2023-11-06 DIAGNOSIS — Z0000 Encounter for general adult medical examination without abnormal findings: Secondary | ICD-10-CM | POA: Diagnosis not present

## 2023-11-06 DIAGNOSIS — I10 Essential (primary) hypertension: Secondary | ICD-10-CM

## 2023-11-06 DIAGNOSIS — M170 Bilateral primary osteoarthritis of knee: Secondary | ICD-10-CM

## 2023-11-06 DIAGNOSIS — C4360 Malignant melanoma of unspecified upper limb, including shoulder: Secondary | ICD-10-CM | POA: Diagnosis not present

## 2023-11-06 DIAGNOSIS — F419 Anxiety disorder, unspecified: Secondary | ICD-10-CM

## 2023-11-06 LAB — LIPID PANEL
Cholesterol: 214 mg/dL — ABNORMAL HIGH (ref 0–200)
HDL: 69.5 mg/dL (ref >39.00)
LDL Cholesterol: 103 mg/dL — ABNORMAL HIGH (ref 0–99)
NonHDL: 144.36
Total CHOL/HDL Ratio: 3
Triglycerides: 205 mg/dL — ABNORMAL HIGH (ref 0.0–149.0)
VLDL: 41 mg/dL — ABNORMAL HIGH (ref 0.0–40.0)

## 2023-11-06 LAB — CBC WITH DIFFERENTIAL/PLATELET
Basophils Absolute: 0 K/uL (ref 0.0–0.1)
Basophils Relative: 0.3 % (ref 0.0–3.0)
Eosinophils Absolute: 0.1 K/uL (ref 0.0–0.7)
Eosinophils Relative: 0.9 % (ref 0.0–5.0)
HCT: 41.6 % (ref 36.0–46.0)
Hemoglobin: 13.8 g/dL (ref 12.0–15.0)
Lymphocytes Relative: 27.4 % (ref 12.0–46.0)
Lymphs Abs: 1.9 K/uL (ref 0.7–4.0)
MCHC: 33.2 g/dL (ref 30.0–36.0)
MCV: 93.4 fl (ref 78.0–100.0)
Monocytes Absolute: 0.6 K/uL (ref 0.1–1.0)
Monocytes Relative: 9.1 % (ref 3.0–12.0)
Neutro Abs: 4.4 K/uL (ref 1.4–7.7)
Neutrophils Relative %: 62.3 % (ref 43.0–77.0)
Platelets: 222 K/uL (ref 150.0–400.0)
RBC: 4.45 Mil/uL (ref 3.87–5.11)
RDW: 13.4 % (ref 11.5–15.5)
WBC: 7 K/uL (ref 4.0–10.5)

## 2023-11-06 LAB — COMPREHENSIVE METABOLIC PANEL WITH GFR
ALT: 15 U/L (ref 0–35)
AST: 19 U/L (ref 0–37)
Albumin: 4.4 g/dL (ref 3.5–5.2)
Alkaline Phosphatase: 57 U/L (ref 39–117)
BUN: 17 mg/dL (ref 6–23)
CO2: 28 meq/L (ref 19–32)
Calcium: 9.4 mg/dL (ref 8.4–10.5)
Chloride: 102 meq/L (ref 96–112)
Creatinine, Ser: 1.25 mg/dL — ABNORMAL HIGH (ref 0.40–1.20)
GFR: 45.16 mL/min — ABNORMAL LOW (ref >60.00)
Glucose, Bld: 104 mg/dL — ABNORMAL HIGH (ref 70–99)
Potassium: 4.3 meq/L (ref 3.5–5.1)
Sodium: 140 meq/L (ref 135–145)
Total Bilirubin: 0.4 mg/dL (ref 0.2–1.2)
Total Protein: 7.6 g/dL (ref 6.0–8.3)

## 2023-11-06 LAB — HEMOGLOBIN A1C: Hgb A1c MFr Bld: 5.4 % (ref 4.6–6.5)

## 2023-11-06 MED ORDER — HYDROCODONE-ACETAMINOPHEN 10-325 MG PO TABS: 1.0000 | ORAL_TABLET | Freq: Three times a day (TID) | ORAL | 0 refills | Status: DC | PRN

## 2023-11-06 MED ORDER — QVAR REDIHALER 80 MCG/ACT IN AERB: 2.0000 | INHALATION_SPRAY | Freq: Two times a day (BID) | RESPIRATORY_TRACT | 2 refills | Status: AC

## 2023-11-06 NOTE — Progress Notes (Signed)
 Subjective:    Virginia Cabrera is a 68 y.o. female and is here for a comprehensive physical exam.  HPI  Health Maintenance Due  Topic Date Due   DEXA SCAN  Never done    Discussed the use of AI scribe software for clinical note transcription with the patient, who gave verbal consent to proceed.  History of Present Illness Virginia Cabrera is a 68 year old female who presents for follow-up on her mood and pain management.  She experiences ongoing depression and is cautious about starting new medications due to concerns about feeling overly altered. She consumes alcohol occasionally, with an increase expected during the football season, drinking light beer approximately twice a week without intoxication.  She recently fell after slipping on her dog's vomit, resulting in a bruised bone in her knee. X-rays revealed bone-on-bone contact and a missing piece of bone in one knee. She also has a compression fracture in her back with a V-shape visible on imaging. She takes hydrocodone  5 mg/325 mg twice daily for pain, sometimes increasing to two tablets when needed. She uses a Qvar  inhaler twice daily and takes blood pressure medication - amlodipine  5 mg daily, losartan  100 mg daily and spironolactone  25 mg daily which is effective.  She has adverse reactions to anesthesia, including a severe reaction to morphine and pentothal during wisdom teeth extraction at age 81, resulting in prolonged unconsciousness. She is sensitive to medications like Valium, which caused excessive sedation during a past emergency room visit.  She experiences lightheadedness if she skips meals, particularly breakfast. She drinks a lot of water and tracks her intake, leading to frequent urination.  Her family history includes a mother with breast cancer and melanoma, and a sister with knee issues requiring surgery. Her mother, aged 54, recently had another melanoma removed.   She has a longstanding skin lesion that has  grown over the years but has not seen a dermatologist for it. She has no history of melanoma herself.  Health Maintenance: Immunizations -- declines all vaxx Colonoscopy -- Cologuard UpToDate  Mammogram -- utd PAP -- refused Bone Density -- ordered Diet -- limited intake throughout the day Exercise -- limited due to knee pain  Sleep habits -- no concerns Mood -- stable  UTD with dentist? - yes UTD with eye doctor? - yes  Weight history: Wt Readings from Last 10 Encounters:  11/06/23 214 lb (97.1 kg)  09/14/23 212 lb (96.2 kg)  08/10/23 212 lb (96.2 kg)  06/09/23 212 lb 6.4 oz (96.3 kg)  02/17/23 215 lb (97.5 kg)  01/05/23 214 lb (97.1 kg)  12/30/22 217 lb 6.1 oz (98.6 kg)  10/01/22 214 lb (97.1 kg)  09/23/22 217 lb (98.4 kg)  09/02/22 216 lb 4 oz (98.1 kg)   Body mass index is 39.14 kg/m. No LMP recorded. Patient is postmenopausal.  Alcohol use:  reports current alcohol use.  Tobacco use:  Tobacco Use: Medium Risk (11/06/2023)   Patient History    Smoking Tobacco Use: Former    Smokeless Tobacco Use: Never    Passive Exposure: Not on file   Eligible for lung cancer screening? no     09/14/2023    8:43 AM  Depression screen PHQ 2/9  Decreased Interest 2  Down, Depressed, Hopeless 3  PHQ - 2 Score 5  Altered sleeping 2  Tired, decreased energy 2  Change in appetite 1  Feeling bad or failure about yourself  1  Trouble concentrating 1  Moving  slowly or fidgety/restless 0  Suicidal thoughts 0  PHQ-9 Score 12  Difficult doing work/chores Very difficult     Other providers/specialists: Patient Care Team: Job Lukes, [place] as PCP - General (Physician Assistant) Nahser, Aleene PARAS, MD (Inactive) as PCP - Cardiology (Cardiology)    PMHx, SurgHx, SocialHx, Medications, and Allergies were reviewed in the Visit Navigator and updated as appropriate.   Past Medical History:  Diagnosis Date   Allergy    Anal fissure    Anxiety    Arthritis    Asthma     Colon polyp, hyperplastic    Depression    GERD (gastroesophageal reflux disease)    Hypertension    IBS (irritable bowel syndrome)    Vitamin deficiency      Past Surgical History:  Procedure Laterality Date   CESAREAN SECTION     TONSILLECTOMY  1965   WISDOM TOOTH EXTRACTION  1978   anesthesia issues     Family History  Problem Relation Age of Onset   Breast cancer Mother    Hypertension Mother    Melanoma Mother    Brain cancer Father    Hypertension Sister    Colon polyps Sister    Hypertension Sister    Diabetes Brother    Kidney disease Brother    Hypertension Brother    Post-traumatic stress disorder Brother        war   Emphysema Maternal Grandmother    Heart disease Maternal Grandfather    Breast cancer Maternal Aunt    Stroke Maternal Aunt    Heart attack Maternal Aunt    Leukemia Maternal Aunt    Colon cancer Other    Esophageal cancer Other    Stomach cancer Other    Pancreatic cancer Other     Social History   Tobacco Use   Smoking status: Former    Current packs/day: 0.00    Average packs/day: 0.2 packs/day for 15.0 years (3.0 ttl pk-yrs)    Types: Cigarettes    Start date: 08/24/1983    Quit date: 08/24/1998    Years since quitting: 25.2   Smokeless tobacco: Never  Vaping Use   Vaping status: Never Used  Substance Use Topics   Alcohol use: Yes    Comment: occssionally   Drug use: No    Review of Systems:   Review of Systems  Constitutional:  Negative for chills, fever, malaise/fatigue and weight loss.  HENT:  Negative for hearing loss, sinus pain and sore throat.   Respiratory:  Negative for cough and hemoptysis.   Cardiovascular:  Negative for chest pain, palpitations, leg swelling and PND.  Gastrointestinal:  Negative for abdominal pain, constipation, diarrhea, heartburn, nausea and vomiting.  Genitourinary:  Negative for dysuria, frequency and urgency.  Musculoskeletal:  Negative for back pain, myalgias and neck pain.  Skin:   Negative for itching and rash.  Neurological:  Negative for dizziness, tingling, seizures and headaches.  Endo/Heme/Allergies:  Negative for polydipsia.  Psychiatric/Behavioral:  Negative for depression. The patient is not nervous/anxious.     Objective:   BP 138/80 (BP Location: Left Arm, Patient Position: Sitting, Cuff Size: Large)   Pulse 77   Temp 98.1 F (36.7 C) (Temporal)   Ht 5' 2 (1.575 m)   Wt 214 lb (97.1 kg)   SpO2 98%   BMI 39.14 kg/m  Body mass index is 39.14 kg/m.   General Appearance:    Alert, cooperative, no distress, appears stated age  Head:    Normocephalic,  without obvious abnormality, atraumatic  Eyes:    PERRL, conjunctiva/corneas clear, EOM's intact, fundi    benign, both eyes  Ears:    Normal TM's and external ear canals, both ears  Nose:   Nares normal, septum midline, mucosa normal, no drainage    or sinus tenderness  Throat:   Lips, mucosa, and tongue normal; teeth and gums normal  Neck:   Supple, symmetrical, trachea midline, no adenopathy;    thyroid :  no enlargement/tenderness/nodules; no carotid   bruit or JVD  Back:     Symmetric, no curvature, ROM normal, no CVA tenderness  Lungs:     Clear to auscultation bilaterally, respirations unlabored  Chest Wall:    No tenderness or deformity   Heart:    Regular rate and rhythm, S1 and S2 normal, no murmur, rub or gallop  Breast Exam:    Deferred  Abdomen:     Soft, non-tender, bowel sounds active all four quadrants,    no masses, no organomegaly  Genitalia:    Deferred  Extremities:   Extremities normal, atraumatic, no cyanosis or edema  Pulses:   2+ and symmetric all extremities  Skin:   Skin color, texture, turgor normal, no rashes 22mm x 22mm hyperpigmented lesion to anterior left arm   Lymph nodes:   Cervical, supraclavicular, and axillary nodes normal  Neurologic:   CNII-XII intact, normal strength, sensation and reflexes    throughout   Punch biopsy Meds, vitals, and allergies  reviewed.  Indication: suspicious lesion Location: left arm Size: 22mm x 22mm  Informed consent obtained.  Pt aware of risks not limited to but including infection, bleeding, damage to near by organs.  Prep: etoh/betadine  Anesthesia: 1%lidocaine with epi, good effect  Punch made with 4mm punch  Minimal oozing, controlled with silver nitrate; electrocautery also used  Tolerated well   Assessment/Plan:   Assessment and Plan Assessment & Plan Comprehensive Physical Exam (CPE) preventive care annual visit Today patient counseled on age appropriate routine health concerns for screening and prevention, each reviewed and up to date or declined. Immunizations reviewed and up to date or declined. Labs ordered and reviewed. Risk factors for depression reviewed and negative. Hearing function and visual acuity are intact. ADLs screened and addressed as needed. Functional ability and level of safety reviewed and appropriate. Education, counseling and referrals performed based on assessed risks today. Patient provided with a copy of personalized plan for preventive services.  Shortness of breath Overall controlled Refill qvar   Follow up if new/worsening symptom(s)   Depression and anxiety Persistent depression with history of panic attacks and medication sensitivity. Concerned about medication side effects and prefers no changes. Alcohol intake stable but may increase during football season.  Osteoarthritis of bilateral knees Severe osteoarthritis with bone-on-bone contact. Avoids surgery due to past adverse reactions to anesthesia and pain medications. Uses knee brace for support. - Increase hydrocodone -acetaminophen  to 10-325 mg orally every 6 hours as needed. - Use knee brace to limit bending and provide support.  Abnormal skin lesion Long-standing skin lesion with recent growth and family history of melanoma. Lesion size raises concern for melanoma. -Punch biopsy completed  today -Routine postprocedure instructions d/w pt- keep area clean and bandaged, follow up if concerns/spreading erythema/pain.  Essential hypertension Blood pressure well-controlled with current medication regimen. Continue amlodipine  5 mg daily, losartan  100 mg daily and spironolactone  25 mg daily        Rasa Degrazia, PA-C [facility]

## 2023-11-06 NOTE — Patient Instructions (Addendum)
 It was great to see you!  Call [facility] to schedule DEXA (bone density test)  Wound Care Instructions   Cleanse wound gently with soap and water once a day then pat dry with clean gauze. Apply a thin coat of Petrolatum (petroleum jelly, Vaseline) over the wound (unless you have an allergy to this). We recommend that you use a new, sterile tube of Vaseline. Do not pick or remove scabs. Do not remove the yellow or white healing tissue from the base of the wound.   Cover the wound with fresh, clean, nonstick gauze and secure with paper tape. You may use Band-Aids in place of gauze and tape if the wound is small enough, but would recommend trimming much of the tape off as there is often too much. Sometimes Band-Aids can irritate the skin.   3.   If you experience any problems, such as abnormal amounts of bleeding, swelling, significant bruising, significant pain, or evidence of infection, please call the office immediately.

## 2023-11-10 ENCOUNTER — Ambulatory Visit: Payer: Self-pay | Admitting: Physician Assistant

## 2023-11-10 DIAGNOSIS — C439 Malignant melanoma of skin, unspecified: Secondary | ICD-10-CM

## 2023-11-12 LAB — SURGICAL PATHOLOGY

## 2023-11-19 ENCOUNTER — Encounter: Payer: Self-pay | Admitting: Dermatology

## 2023-11-24 ENCOUNTER — Encounter: Payer: Self-pay | Admitting: Dermatology

## 2023-11-24 ENCOUNTER — Ambulatory Visit: Admitting: Dermatology

## 2023-11-24 VITALS — BP 137/81 | HR 76 | Temp 98.4°F

## 2023-11-24 DIAGNOSIS — C4361 Malignant melanoma of right upper limb, including shoulder: Secondary | ICD-10-CM | POA: Diagnosis not present

## 2023-11-24 DIAGNOSIS — C4362 Malignant melanoma of left upper limb, including shoulder: Secondary | ICD-10-CM | POA: Diagnosis not present

## 2023-11-24 DIAGNOSIS — C439 Malignant melanoma of skin, unspecified: Secondary | ICD-10-CM

## 2023-11-24 NOTE — Progress Notes (Signed)
 Follow-Up Visit   Subjective  Virginia Cabrera is a 68 y.o. 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   The following portions of the chart were reviewed this encounter and updated as appropriate: medications, allergies, medical history  Review of Systems:  No other skin or systemic complaints except as noted in HPI or Assessment and Plan.  Objective  Well appearing patient in no apparent distress; mood and affect are within normal limits.  A focused examination was performed of the following areas: Left arm(shoulder) Relevant physical exam findings are noted in the Assessment and Plan.   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 No    Anesthesia: Anesthesia method: local infiltration Local anesthetic: lidocaine 1% WITH epi  Procedure Details: Timeout: pre-procedure verification complete Procedure Prep: patient was prepped and draped in usual sterile fashion Prep type: chlorhexidine Biopsy accession number: [id number] Specimen debulked: Yes   Pre-Op diagnosis: melanoma Melanoma subtype: invasive Melanoma Breslow depth (mm): 1 Melanoma histologic subtype: superficial spreading MohsAIQ Surgical site (if tumor spans multiple areas, please select predominant area): upper extremity Surgery side: left Surgical site (from skin exam): Left arm Pre-operative length (cm): 2.3 Pre-operative width (cm): 2.2 Indications for Mohs surgery: anatomic location where tissue conservation is critical and aggressive histology  Micrographic Surgery Details: Post-operative length (cm): 4 Post-operative width (cm): 3.8 Number of Mohs stages: 1 Post surgery depth of defect: subcutaneous fat  Stage 1    Tumor features identified  on Mohs section: no tumor identified  Reconstruction: Was the defect reconstructed? Yes   Was reconstruction performed by the same Mohs surgeon? Yes   Setting of reconstruction: outpatient office When was reconstruction performed? same day Type of reconstruction: linear  Skin repair Complexity:  Complex Final length (cm):  9.5 Informed consent: discussed and consent obtained   Timeout: patient name, date of birth, surgical site, and procedure verified   Procedure prep:  Patient was prepped and draped in usual sterile fashion Prep type:  Chlorhexidine Anesthesia: the lesion was anesthetized in a standard fashion   Anesthetic:  1% lidocaine w/ epinephrine 1-100,000 buffered w/ 8.4% NaHCO3 Reason for type of repair: reduce tension to allow closure, preserve normal anatomy, preserve normal anatomical and functional relationships, avoid adjacent structures and allow side-to-side closure without requiring a flap or graft   Undermining: area extensively undermined   Subcutaneous layers (deep stitches):  Suture size:  3-0 (with dermabond and steri strips) Suture type: PDS (polydioxanone)   Stitches:  Buried vertical mattress Fine/surface layer approximation (top stitches):  Suture type: cyanoacrylate tissue glue   Hemostasis achieved with: suture, pressure and electrodesiccation Outcome: patient tolerated procedure well with no complications   Post-procedure details: sterile dressing applied and wound care instructions given   Dressing type: petrolatum and pressure dressing    Specimen 1 - Surgical pathology Differential Diagnosis: MM  Check Margins: No Original bx only central punch. Need definitive breslow depth needed for full lesion (present in debulk)   Discussed with the patient the diagnosis of melanoma on the left upper arm, initially evaluated by punch biopsy showing a depth of 0.7 mm. Explained that since only a partial biopsy was performed, the measured depth may not fully  represent the true thickness of the entire lesion, which could impact  treatment recommendations. I reviewed that during Mohs surgery, I will carefully examine the tumor under frozen sections and personally assess the debulk specimen to estimate the depth intraoperatively. However, frozen section analysis has limitations, so the debulk tissue will also be sent for permanent sectioning to provide a comprehensive and accurate assessment of the tumor's full features and true depth. Emphasized that if the final permanent pathology reveals a depth of 0.8 mm or greater, additional treatment considerations may include sentinel lymph node biopsy and possible involvement of medical oncology for systemic evaluation. The patient was counseled on the benefits of Mohs micrographic surgery as a precise margin-controlled technique for complete removal, with the goal of accurate staging and tailored management. The patient verbalized understanding of the procedure, the need for further pathology review, and potential additional treatment steps depending on final tumor characteristics.  Return in about 4 weeks (around 12/22/2023) for wound check, also TBSE w brenda asap.  Virginia Cabrera, CMA, am acting as scribe for RUFUS CHRISTELLA HOLY, MD.    11/25/2023  HISTORY OF PRESENT ILLNESS  Virginia Cabrera is seen in consultation at the request of Lucie Buttner, PA-C for biopsy-proven Invasive Melanoma (0.6 m) on the left upper arm. They note that the area has been present for about 8 years increasing in size with time.  There is no history of previous treatment.  Reports no other new or changing lesions and has no other complaints today.  Medications and allergies: see patient chart.  Review of systems: Reviewed 8 systems and notable for the above skin cancer.  All other systems reviewed are unremarkable/negative, unless noted in the HPI. Past medical history, surgical history, family history, social history were also reviewed  and are noted in the chart/questionnaire.    PHYSICAL EXAMINATION  General: Well-appearing, in no acute distress, alert and oriented x 4. Vitals reviewed in chart (if available).   Skin: Exam reveals a 2.3 x 2.2 cm erythematous papule and biopsy scar on the left upper arm. There are rhytids, telangiectasias, and lentigines, consistent with photodamage. Lymph nodes: No cervical, axillary or inguinal lymphadenopathy.  Biopsy report(s) reviewed, confirming the diagnosis.   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 Due to location, size, histology, or recurrence and the likelihood of subclinical extension as well as the need to conserve normal surrounding tissue, the patient was deemed acceptable for Mohs micrographic surgery (MMS).  The nature and purpose of the procedure, associated benefits and risks including recurrence and scarring, possible complications such as pain, infection, and bleeding, and alternative methods of treatment if appropriate were discussed with the patient during consent. The lesion location was verified by the patient, by reviewing previous notes, pathology reports, and by photographs as well as angulation measurements if available.  Informed consent was reviewed and signed by the patient, and timeout was performed at 8:15 AM. See op note below.  2. For the photodamage and solar lentigines, sun protection discussed/information given on OTC sunscreens, and we recommend continued regular follow-up with primary dermatologist every 6 months or sooner for any growing, bleeding, or changing lesions. 3. Prognosis and future surveillance discussed. 4. Letter with treatment outcome sent to referring provider. 5. Pain acetaminophen /ibuprofen /oxycodone  5 mg  MOHS MICROGRAPHIC SURGERY AND RECONSTRUCTION  Initial size:   2.3 x 2.2 cm Surgical defect/wound size: 4.0 x 3.8 cm Anesthesia:    0.33% lidocaine with  1:200,000 epinephrine EBL:    <  5 mL Complications:  None Repair type:   Complex SQ suture:   3-0 PDS Cutaneous suture:  6-0 Plain gut Final size of the repair: 9.5 cm  Stages: 1  STAGE I: Anesthesia achieved with 0.5% lidocaine with 1:200,000 epinephrine. ChloraPrep applied. 5 section(s) excised using Mohs technique (this includes total peripheral and deep tissue margin excision and evaluation with frozen sections, excised and interpreted by the same physician). The tumor was first debulked and then excised with an approx. 2mm margin.  Hemostasis was achieved with electrocautery as needed.  The specimen was then oriented, subdivided/relaxed, inked, and processed using Mohs technique.  Tissue was stained with H&E and MART-1 with 2 chromogens (2 immunostains).  Frozen section analysis revealed a clear deep and peripheral margin.   Reconstruction  The surgical wound was then cleaned, prepped, and re-anesthetized as above. Wound edges were undermined extensively along at least one entire edge and at a distance equal to or greater than the width of the defect (see wound defect size above) in order to achieve closure and decrease wound tension and anatomic distortion. Redundant tissue repair including standing cone removal was performed. Hemostasis was achieved with electrocautery. Subcutaneous and epidermal tissues were approximated with the above sutures. The surgical site was then lightly scrubbed with sterile, saline-soaked gauze. Steri-strips were applied, and the area was then bandaged using Vaseline ointment, non-adherent gauze, gauze pads, and tape to provide an adequate pressure dressing. The patient tolerated the procedure well, was given detailed written and verbal wound care instructions, and was discharged in good condition.   The patient will follow-up: 4 weeks.    Documentation: I have reviewed the above documentation for accuracy and completeness, and I agree with the  above.  RUFUS CHRISTELLA HOLY, MD

## 2023-11-24 NOTE — Patient Instructions (Signed)

## 2023-11-25 ENCOUNTER — Other Ambulatory Visit: Payer: Self-pay

## 2023-11-25 ENCOUNTER — Telehealth: Payer: Self-pay

## 2023-11-25 DIAGNOSIS — C439 Malignant melanoma of skin, unspecified: Secondary | ICD-10-CM

## 2023-11-25 NOTE — Telephone Encounter (Signed)
 I discussed with patient the pathology showing the 1mm thickness on the melanoma from her left arm. I made the patient aware that a referral would be placed to Dr. Nanci at [facility] surgical oncology and that their office would reach out to her to schedule.

## 2023-11-27 ENCOUNTER — Other Ambulatory Visit: Payer: Self-pay | Admitting: Physician Assistant

## 2023-11-27 MED ORDER — HYDROCODONE-ACETAMINOPHEN 10-325 MG PO TABS: 1.0000 | ORAL_TABLET | Freq: Three times a day (TID) | ORAL | 0 refills | Status: DC | PRN

## 2023-11-27 NOTE — Telephone Encounter (Signed)
 Pt requesting refill for Norco 10-325 mg. Last OV 11/06/2023.

## 2023-11-27 NOTE — Telephone Encounter (Signed)
 Copied from CRM [id number]. Topic: Clinical - Medication Refill >> Nov 27, 2023  9:39 AM Donee H wrote: Medication: HYDROcodone -acetaminophen  (NORCO) 10-325 MG tablet   Has the patient contacted their pharmacy? Yes, was advised to reach out to provider for refill request   This is the patient's preferred pharmacy:  [id number] - [place] ([place]), [place] - [address] [place] ([place]) [place] [address] Phone: [phone] Fax: [phone]  Is this the correct pharmacy for this prescription? Yes  Has the prescription been filled recently? No  Is the patient out of the medication? No  Has the patient been seen for an appointment in the last year OR does the patient have an upcoming appointment? Yes  Can we respond through MyChart? Yes  Agent: Please be advised that Rx refills may take up to 3 business days. We ask that you follow-up with your pharmacy.

## 2023-11-30 ENCOUNTER — Ambulatory Visit: Admitting: Physician Assistant

## 2023-12-03 ENCOUNTER — Encounter: Payer: Self-pay | Admitting: Dermatology

## 2023-12-07 ENCOUNTER — Ambulatory Visit: Admitting: Physician Assistant

## 2023-12-07 ENCOUNTER — Encounter: Payer: Self-pay | Admitting: Physician Assistant

## 2023-12-07 VITALS — BP 130/86 | HR 68

## 2023-12-07 DIAGNOSIS — L821 Other seborrheic keratosis: Secondary | ICD-10-CM

## 2023-12-07 DIAGNOSIS — D489 Neoplasm of uncertain behavior, unspecified: Secondary | ICD-10-CM | POA: Diagnosis not present

## 2023-12-07 DIAGNOSIS — Z85820 Personal history of malignant melanoma of skin: Secondary | ICD-10-CM

## 2023-12-07 DIAGNOSIS — L578 Other skin changes due to chronic exposure to nonionizing radiation: Secondary | ICD-10-CM | POA: Diagnosis not present

## 2023-12-07 DIAGNOSIS — D2272 Melanocytic nevi of left lower limb, including hip: Secondary | ICD-10-CM | POA: Diagnosis not present

## 2023-12-07 DIAGNOSIS — W908XXA Exposure to other nonionizing radiation, initial encounter: Secondary | ICD-10-CM | POA: Diagnosis not present

## 2023-12-07 DIAGNOSIS — L814 Other melanin hyperpigmentation: Secondary | ICD-10-CM

## 2023-12-07 DIAGNOSIS — D1801 Hemangioma of skin and subcutaneous tissue: Secondary | ICD-10-CM

## 2023-12-07 DIAGNOSIS — D239 Other benign neoplasm of skin, unspecified: Secondary | ICD-10-CM

## 2023-12-07 DIAGNOSIS — D229 Melanocytic nevi, unspecified: Secondary | ICD-10-CM

## 2023-12-07 DIAGNOSIS — Z1283 Encounter for screening for malignant neoplasm of skin: Secondary | ICD-10-CM | POA: Diagnosis not present

## 2023-12-07 HISTORY — DX: Other benign neoplasm of skin, unspecified: D23.9

## 2023-12-07 NOTE — Progress Notes (Signed)
 Total Body Skin Exam (TBSE) Visit   Subjective  Virginia Cabrera is a 68 y.o. female ESTABLISHED PATIENT who presents for the following: Skin Cancer Screening and Full Body Skin Exam  Patient presents today for follow up visit for TBSE. Patient was last evaluated on 11/24/23 and underwent Mohs surgery for a malignant melanoma with Dr. Corey. Previous pathology report (specimen from punch biopsy) demonstrated a depth of 0.7 mm and after the Mohs surgery it was shown to be 1.0 mm. Patient has a scheduled appointment with Dr. Billy at [facility] tomorrow - 12/08/23 to discuss sentinel lymph node testing.     The following portions of the chart were reviewed this encounter and updated as appropriate: medications, allergies, medical history  Review of Systems:  No other skin or systemic complaints except as noted in HPI or Assessment and Plan.  Objective  Well appearing patient in no apparent distress; mood and affect are within normal limits.  A full examination was performed including scalp, head, eyes, ears, nose, lips, neck, chest, axillae, abdomen, back, buttocks, bilateral upper extremities, bilateral lower extremities, hands, feet, fingers, toes, fingernails, and toenails. All findings within normal limits unless otherwise noted below.   NO CERVICAL, AXILLARY OR INGUINAL LYMPHADENOPATHY.   Relevant physical exam findings are noted in the Assessment and Plan.  Left Lower Leg - Anterior 0.6 CM  IRREGULAR BROWN MACULE    Assessment & Plan     HISTORY OF MELANOMA - No evidence of recurrence today - No lymphadenopathy - Recommend regular full body skin exams - Recommend daily broad spectrum sunscreen SPF 30+ to sun-exposed areas, reapply every 2 hours as needed.  - Call if any new or changing lesions are noted between office visits   LENTIGINES, SEBORRHEIC KERATOSES, HEMANGIOMAS - Benign normal skin lesions - Benign-appearing - Call for any changes  MELANOCYTIC NEVI - Tan-brown  and/or pink-flesh-colored symmetric macules and papules - Benign appearing on exam today - Observation - Call clinic for new or changing moles - Recommend daily use of broad spectrum spf 30+ sunscreen to sun-exposed areas.   ACTINIC DAMAGE - Chronic condition, secondary to cumulative UV/sun exposure - diffuse scaly erythematous macules with underlying dyspigmentation - Recommend daily broad spectrum sunscreen SPF 30+ to sun-exposed areas, reapply every 2 hours as needed.  - Staying in the shade or wearing long sleeves, sun glasses (UVA+UVB protection) and wide brim hats (4-inch brim around the entire circumference of the hat) are also recommended for sun protection.  - Call for new or changing lesions.  SKIN CANCER SCREENING PERFORMED TODAY.     NEOPLASM OF UNCERTAIN BEHAVIOR Left Lower Leg - Anterior Epidermal / dermal shaving  Lesion diameter (cm):  0.6 Informed consent: discussed and consent obtained   Timeout: patient name, date of birth, surgical site, and procedure verified   Procedure prep:  Patient was prepped and draped in usual sterile fashion Prep type:  Isopropyl alcohol Anesthesia: the lesion was anesthetized in a standard fashion   Anesthetic:  1% lidocaine w/ epinephrine 1-100,000 buffered w/ 8.4% NaHCO3 Instrument used: flexible razor blade   Hemostasis achieved with: pressure, aluminum chloride and electrodesiccation   Outcome: patient tolerated procedure well   Post-procedure details: sterile dressing applied and wound care instructions given   Dressing type: bandage and petrolatum    Specimen 1 - Surgical pathology Differential Diagnosis: 0.6 CM DN VS MM  Check Margins: No PERSONAL HISTORY OF MALIGNANT MELANOMA OF SKIN   MULTIPLE BENIGN NEVI   LENTIGINES  CHERRY ANGIOMA   SEBORRHEIC KERATOSIS   ACTINIC SKIN DAMAGE   SCREENING EXAM FOR SKIN CANCER   Return in about 3 months (around 03/07/2024) for TBSE FOLLOW UP.  I, Doyce Pan,  CMA, am acting as scribe for [employer], PA-C.   Documentation: I have reviewed the above documentation for accuracy and completeness, and I agree with the above.  Aayden Cefalu K, PA-C

## 2023-12-07 NOTE — Patient Instructions (Addendum)

## 2023-12-09 LAB — SURGICAL PATHOLOGY

## 2023-12-13 ENCOUNTER — Ambulatory Visit: Payer: Self-pay | Admitting: Physician Assistant

## 2023-12-17 DIAGNOSIS — D485 Neoplasm of uncertain behavior of skin: Secondary | ICD-10-CM | POA: Diagnosis not present

## 2023-12-18 DIAGNOSIS — C4362 Malignant melanoma of left upper limb, including shoulder: Secondary | ICD-10-CM | POA: Diagnosis not present

## 2023-12-18 NOTE — Progress Notes (Signed)
 I saw the patient and personally performed a history and physical exam.  I discussed the findings, assessment and plan with the resident and agree with the documentation in the note.  As noted, newly diagnosed melanoma of the left arm.  Status post Mohs microsurgery.  Definitive pathology revealed a Breslow depth of 1 mm.  Referred to discuss sentinel lymphadenectomy.  I have taken her through with the risk of a positive sentinel lymph node.  It would be estimated to be positive in 6 to 8% of cases with a melanoma like hers.  We have talked through the pros and cons of that.  After discussion, we have made the decision to proceed with sentinel lymphadenectomy.  I have taken her through expectations for the procedure.  She knows she will have a lymphoscintigraphy that day as well as general anesthesia.  We discussed broad expectations in that regard.  Surgery will be scheduled in the near future.  Ozell KIDD. Nanci, MD [facility] [address] [place], [place] [address] Phone: [phone] FAX: [phone]

## 2023-12-22 ENCOUNTER — Ambulatory Visit: Admitting: Dermatology

## 2023-12-23 ENCOUNTER — Encounter: Payer: Self-pay | Admitting: Dermatology

## 2023-12-23 ENCOUNTER — Ambulatory Visit (INDEPENDENT_AMBULATORY_CARE_PROVIDER_SITE_OTHER): Admitting: Dermatology

## 2023-12-23 VITALS — BP 136/80 | HR 74

## 2023-12-23 DIAGNOSIS — C439 Malignant melanoma of skin, unspecified: Secondary | ICD-10-CM

## 2023-12-23 DIAGNOSIS — L905 Scar conditions and fibrosis of skin: Secondary | ICD-10-CM

## 2023-12-23 DIAGNOSIS — C4362 Malignant melanoma of left upper limb, including shoulder: Secondary | ICD-10-CM | POA: Diagnosis not present

## 2023-12-23 NOTE — Patient Instructions (Addendum)

## 2023-12-23 NOTE — Progress Notes (Signed)
   Follow Up Visit   Subjective  Virginia Cabrera is a 68 y.o. female who presents for the following: follow up from Mohs surgery   The patient presents for follow up from Mohs surgery for a MM on the left arm, treated on 11/24/23, repaired with linear closure. The patient has been bandaging the wound as directed. The endorse the following concerns: none. She is scheduled for SNLB with Dr. Billy at [facility].   The following portions of the chart were reviewed this encounter and updated as appropriate: medications, allergies, medical history  Review of Systems:  No other skin or systemic complaints except as noted in HPI or Assessment and Plan.  Objective  Well appearing patient in no apparent distress; mood and affect are within normal limits.  A focal examination was performed including scalp, head, face and left arm All findings within normal limits unless otherwise noted below.  Healing wound with mild erythema  Relevant physical exam findings are noted in the Assessment and Plan.    Assessment & Plan   Invasive Melanoma (upstaged to T2a due to debulk permanent sections to 1.0 mm depth) - Patient scheduled for SNLB with Dr. Billy at [facility], patient already met with Dr. Billy - Discussed importance of q3 month TBSEs  Healing Wound s/p Mohs for MM on the left shoulder, treated on 11/24/23, repaired with linear closure - Reassured that wound is healing well - No evidence of infection - No swelling, induration, purulence, dehiscence, or tenderness out of proportion to the clinical exam, see photo above - Discussed that scars take up to 12 months to mature from the date of surgery - Recommend SPF 30+ to scar daily to prevent purple color from UV exposure during scar maturation process - Discussed that erythema and raised appearance of scar will fade over the next 4-6 months - OK to start scar massage at 4-6 weeks post-op - Can consider silicone based products for scar  healing starting at 6 weeks post-op  HISTORY OF MELANOMA - No evidence of recurrence today - No lymphadenopathy - Recommend regular full body skin exams - Recommend daily broad spectrum sunscreen SPF 30+ to sun-exposed areas, reapply every 2 hours as needed.  - Call if any new or changing lesions are noted between office visits  Return for TBSE at 3 months.  I, Darice Smock, CMA, am acting as scribe for RUFUS CHRISTELLA HOLY, MD.   Documentation: I have reviewed the above documentation for accuracy and completeness, and I agree with the above.  RUFUS CHRISTELLA HOLY, MD

## 2023-12-25 ENCOUNTER — Other Ambulatory Visit: Payer: Self-pay | Admitting: Physician Assistant

## 2023-12-25 MED ORDER — HYDROCODONE-ACETAMINOPHEN 10-325 MG PO TABS: 1.0000 | ORAL_TABLET | Freq: Three times a day (TID) | ORAL | 0 refills | Status: DC | PRN

## 2023-12-25 NOTE — Telephone Encounter (Unsigned)
 Copied from CRM [id number]. Topic: Clinical - Medication Refill >> Dec 25, 2023  9:59 AM [place] A wrote: Medication: HYDROcodone -acetaminophen  (NORCO) 10-325 MG tablet  Has the patient contacted their pharmacy? No (Agent: If no, request that the patient contact the pharmacy for the refill. If patient does not wish to contact the pharmacy document the reason why and proceed with request.) (Agent: If yes, when and what did the pharmacy advise?)  This is the patient's preferred pharmacy:  [facility] [address] ([address]), [place] - [address] [place] ([place]) [place] [address] Phone: [phone] Fax: [phone]  Is this the correct pharmacy for this prescription? Yes If no, delete pharmacy and type the correct one.   Has the prescription been filled recently? No  Is the patient out of the medication? No 1 pill left  Has the patient been seen for an appointment in the last year OR does the patient have an upcoming appointment? Yes  Can we respond through MyChart? Yes  Agent: Please be advised that Rx refills may take up to 3 business days. We ask that you follow-up with your pharmacy.

## 2023-12-25 NOTE — Telephone Encounter (Signed)
 11/06/2023 LOV  11/27/2023 fill date  30/0 refills

## 2023-12-28 NOTE — Progress Notes (Signed)
 OUTPATIENT SOCIAL WORKER [LCSW] ASSESSMENT NOTE   Service: Adult Hematology Oncology   Referral Source: LCSW received referral from [place], Rn NN, requesting LCSW follow up to support/assist patient/family with financial resources.   Patient (Virginia Cabrera) is a 68 y.o. female with  a diagnosis of melanoma.SABRA LCSW conducted assessment with the patient by telephone on 12/28/2023.   LCSW introduced self and role, emphasizing support for psychosocial needs throughout treatment and education/assistance with community resources.    Living Arrangement/Caregiver Status: Zahava currently resides in a private residence by herself.   Education/Work: Patient is not currently working.   Insurance: The patient has [employer]. Patient is concerned about medical bills that she can not afford to pay for. Sw explained about the [employer] application and agreed to email this to her. Patient will likely turn this in to the [facility] when she has her biopsy.   Financial: Patient's income from SS is <250% of the [employer].  Transportation: Patient's primary mode of transportation is personal vehicle (ride from friend/non-household family member). Patient's daughter will bring her to her appointments.  Sw explained about the [facility] CPAF gas card program and patient would appreciate assistance with this for her biopsy. Patient will inform Sw when this is scheduled (needs to be rescheduled from tomorrow as patient cannot make this now).   Social Drivers of Health   Food Insecurity: Food Insecurity Present (12/18/2023)   Hunger Vital Sign   . Worried About Programme Researcher, Broadcasting/film/video in the Last Year: Sometimes true   . Ran Out of Food in the Last Year: Sometimes true  Tobacco Use: Medium Risk (12/18/2023)   Patient History   . Smoking Tobacco Use: Former   . Smokeless Tobacco Use: Never   . Passive Exposure: Not on file  Transportation Needs: Unmet Transportation Needs (12/18/2023)    PRAPARE - Transportation   . Lack of Transportation (Medical): Yes   . Lack of Transportation (Non-Medical): Yes  Alcohol Use: Not on file  Housing: High Risk (12/18/2023)   Housing   . Within the past 12 months, have you ever stayed: outside, in a car, in a tent, in an overnight shelter, or temporarily in someone else's home (i.e. couch-surfing)?: No   . Are you worried about losing your housing?: Yes  Physical Activity: Inactive (09/14/2023)   Received from [facility]   Exercise Vital Sign   . On average, how many days per week do you engage in moderate to strenuous exercise (like a brisk walk)?: 0 days   . On average, how many minutes do you engage in exercise at this level?: 0 min  Utilities: Low Risk  (12/18/2023)   Utilities   . Within the past 12 months, have you been unable to get utilities (heat, electricity) when it was really needed?: No  Stress: Stress Concern Present (09/14/2023)   Received from [facility] - Occupational Stress Questionnaire   . Do you feel stress - tense, restless, nervous, or anxious, or unable to sleep at night because your mind is troubled all the time - these days?: To some extent  Interpersonal Safety: Not At Risk (12/18/2023)   Interpersonal Safety   . Unsafe Where You Currently Live: No   . Physically Hurt by Anyone: No   . Abused by Anyone: No  Substance Use: Not on file (11/27/2023)  Intimate Partner Violence: Not At Risk (09/02/2022)   Received from [facility]   Humiliation,  Afraid, Rape, and Kick questionnaire   . Within the last year, have you been afraid of your partner or ex-partner?: No   . Within the last year, have you been humiliated or emotionally abused in other ways by your partner or ex-partner?: No   . Within the last year, have you been kicked, hit, slapped, or otherwise physically hurt by your partner or ex-partner?: No   . Within the last year, have you been raped or forced to have any  kind of sexual activity by your partner or ex-partner?: No  Social Connections: Socially Isolated (09/14/2023)   Received from [facility]   Social Connection and Isolation Panel   . In a typical week, how many times do you talk on the phone with family, friends, or neighbors?: More than three times a week   . How often do you get together with friends or relatives?: Three times a week   . How often do you attend church or religious services?: Never   . Do you belong to any clubs or organizations such as church groups, unions, fraternal or athletic groups, or school groups?: No   . How often do you attend meetings of the clubs or organizations you belong to?: Never   . Are you married, widowed, divorced, separated, never married, or living with a partner?: Never married  Physicist, Medical Strain: High Risk (09/14/2023)   Received from [facility]   Overall Financial Resource Strain (CARDIA)   . How hard is it for you to pay for the very basics like food, housing, medical care, and heating?: Hard  Health Literacy: Adequate Health Literacy (09/14/2023)   Received from [facility]   B1300 Health Literacy   . How often do you need to have someone help you when you read instructions, pamphlets, or other written material from your doctor or pharmacy?: Never  Internet Connectivity: Not on file    Resources Discussed/Referrals Made: LCSW provided information/assistance with the following resources:   Sw will complete the CPAF referral form once patient informs Sw of her biopsy date.    LCSW Plan for Follow-Up:  LCSW will continue to be available for psychosocial support and resource assistance as needed/appropriate.  Loa Fees, MSW, LCSW Oncology Outpatient Social Worker [phone]

## 2024-01-18 ENCOUNTER — Other Ambulatory Visit: Payer: Self-pay | Admitting: Physician Assistant

## 2024-01-18 NOTE — Telephone Encounter (Unsigned)
 Copied from CRM #[id number]. Topic: Clinical - Medication Refill >> Jan 18, 2024 11:20 AM Macario HERO wrote: Medication: HYDROcodone -acetaminophen  (NORCO) 10-325 MG tablet [[id number]]  Has the patient contacted their pharmacy? No (Agent: If no, request that the patient contact the pharmacy for the refill. If patient does not wish to contact the pharmacy document the reason why and proceed with request.) (Agent: If yes, when and what did the pharmacy advise?)  This is the patient's preferred pharmacy:  [facility] [address] ([address] [place] - [address] [place] ([place]) [place] [address] Phone: [phone] Fax: [phone]  Is this the correct pharmacy for this prescription? Yes If no, delete pharmacy and type the correct one.   Has the prescription been filled recently? Yes  Is the patient out of the medication? Yes  Has the patient been seen for an appointment in the last year OR does the patient have an upcoming appointment? Yes  Can we respond through MyChart? Yes  Agent: Please be advised that Rx refills may take up to 3 business days. We ask that you follow-up with your pharmacy.

## 2024-01-19 MED ORDER — HYDROCODONE-ACETAMINOPHEN 10-325 MG PO TABS: 1.0000 | ORAL_TABLET | Freq: Three times a day (TID) | ORAL | 0 refills | Status: DC | PRN

## 2024-01-26 ENCOUNTER — Telehealth: Payer: Self-pay | Admitting: *Deleted

## 2024-01-26 NOTE — Telephone Encounter (Signed)
 Noted

## 2024-01-26 NOTE — Telephone Encounter (Signed)
 Copied from CRM #[id number]. Topic: Clinical - Medication Question >> Jan 26, 2024 12:33 PM Corin V wrote: Reason for CRM: Abby with [facility] called to advise that patient was on traMADol  (ULTRAM ) 50 MG tablet,clonazePAM  (KLONOPIN ) 0.5 MG tablet, and HYDROcodone -acetaminophen  (NORCO) 10-325 MG tablet. They do not recommend all three meds together due to age and possible sedation and respiratory depression. They would like provider to lower dosage or stop medication.

## 2024-01-26 NOTE — Telephone Encounter (Signed)
Please see message from the pharmacy

## 2024-02-02 ENCOUNTER — Encounter: Admitting: Dermatology

## 2024-02-09 ENCOUNTER — Ambulatory Visit: Admitting: Physician Assistant

## 2024-02-09 VITALS — BP 136/84 | HR 82 | Temp 98.2°F | Ht 62.0 in | Wt 207.0 lb

## 2024-02-09 DIAGNOSIS — N1831 Chronic kidney disease, stage 3a: Secondary | ICD-10-CM

## 2024-02-09 DIAGNOSIS — J4540 Moderate persistent asthma, uncomplicated: Secondary | ICD-10-CM

## 2024-02-09 DIAGNOSIS — C4362 Malignant melanoma of left upper limb, including shoulder: Secondary | ICD-10-CM

## 2024-02-09 DIAGNOSIS — I10 Essential (primary) hypertension: Secondary | ICD-10-CM

## 2024-02-09 DIAGNOSIS — G894 Chronic pain syndrome: Secondary | ICD-10-CM

## 2024-02-09 MED ORDER — HYDROCODONE-ACETAMINOPHEN 10-325 MG PO TABS: 1.0000 | ORAL_TABLET | Freq: Two times a day (BID) | ORAL | 0 refills | Status: DC | PRN

## 2024-02-09 MED ORDER — ALBUTEROL SULFATE HFA 108 (90 BASE) MCG/ACT IN AERS: INHALATION_SPRAY | RESPIRATORY_TRACT | 2 refills | Status: AC

## 2024-02-09 MED ORDER — GABAPENTIN 100 MG PO CAPS: 100.0000 mg | ORAL_CAPSULE | Freq: Three times a day (TID) | ORAL | 3 refills | Status: AC

## 2024-02-09 NOTE — Progress Notes (Signed)
 History of Present Illness:   Chief Complaint  Patient presents with   Anxiety/Depression    Pt here for 6 month f/u,     Discussed the use of AI scribe software for clinical note transcription with the patient, who gave verbal consent to proceed.  History of Present Illness   Virginia Cabrera is a 68 year old female with invasive malignant melanoma who presents for follow-up regarding her cancer treatment.  She recently had a large melanoma lesion removed from her left arm. Pain from a retained stitch has resolved after removal. A wider excision is planned. She is scheduled for a sentinel lymph node biopsy on January 5th and is worried about the procedure, especially multiple needle sticks and the risk that the cancer has spread. Her mother has had skin cancer, but not melanoma. She reports the lesion has been present for at least eight years and is anxious about possible metastasis.  She has chronic knee and back pain that worsens with standing. She takes hydrocodone  twice daily, mainly morning and night, for knee pain that increased after a recent fall. She describes a constant "toothache" pain in the knee and burning pain in her thighs when standing, worse with weather changes. She avoids surgery and does not want to take tramadol  with hydrocodone . She avoids anti-inflammatories due to kidney concerns and uses Tylenol  for headaches.  She feels constantly stressed, tired, and has difficulty sleeping through the night. She wakes three to four times nightly to urinate and does not feel rested.        Past Medical History:  Diagnosis Date   Allergy    Anal fissure    Anxiety    Arthritis    Asthma    Colon polyp, hyperplastic    Depression    Dysplastic nevus 12/07/2023   Left lower leg - moderate-severe needs excision   GERD (gastroesophageal reflux disease)    Hypertension    IBS (irritable bowel syndrome)    Vitamin deficiency      Social History   Tobacco Use    Smoking status: Former    Current packs/day: 0.00    Average packs/day: 0.2 packs/day for 15.0 years (3.0 ttl pk-yrs)    Types: Cigarettes    Start date: 08/24/1983    Quit date: 08/24/1998    Years since quitting: 25.4   Smokeless tobacco: Never  Vaping Use   Vaping status: Never Used  Substance Use Topics   Alcohol use: Yes    Comment: occssionally   Drug use: No    Past Surgical History:  Procedure Laterality Date   CESAREAN SECTION     TONSILLECTOMY  1965   WISDOM TOOTH EXTRACTION  1978   anesthesia issues    Family History  Problem Relation Age of Onset   Breast cancer Mother    Hypertension Mother    Melanoma Mother    Brain cancer Father    Hypertension Sister    Colon polyps Sister    Hypertension Sister    Diabetes Brother    Kidney disease Brother    Hypertension Brother    Post-traumatic stress disorder Brother        war   Emphysema Maternal Grandmother    Heart disease Maternal Grandfather    Breast cancer Maternal Aunt    Stroke Maternal Aunt    Heart attack Maternal Aunt    Leukemia Maternal Aunt    Colon cancer Other    Esophageal cancer Other  Stomach cancer Other    Pancreatic cancer Other     Allergies  Allergen Reactions   Morphine Other (See Comments)    Causes coma   Morphine And Codeine     Current Medications:   Current Outpatient Medications:    amLODipine  (NORVASC ) 5 MG tablet, Take 1 tablet by mouth once daily, Disp: 90 tablet, Rfl: 3   baclofen  (LIORESAL ) 20 MG tablet, Take 1 tablet (20 mg total) by mouth 3 (three) times daily., Disp: 30 each, Rfl: 0   beclomethasone (QVAR  REDIHALER) 80 MCG/ACT inhaler, Inhale 2 puffs into the lungs 2 (two) times daily., Disp: 1 each, Rfl: 2   busPIRone  (BUSPAR ) 5 MG tablet, Take 1 tablet (5 mg total) by mouth 2 (two) times daily., Disp: 30 tablet, Rfl: 1   cholecalciferol (VITAMIN D3) 25 MCG (1000 UNIT) tablet, Take 1,000 Units by mouth daily., Disp: , Rfl:    clonazePAM  (KLONOPIN ) 0.5 MG  tablet, Take 1 tablet (0.5 mg total) by mouth 2 (two) times daily as needed. for anxiety, Disp: 60 tablet, Rfl: 0   esomeprazole  (NEXIUM ) 40 MG capsule, Take 1 capsule (40 mg total) by mouth daily at 12 noon., Disp: 90 capsule, Rfl: 3   gabapentin  (NEURONTIN ) 100 MG capsule, Take 1 capsule (100 mg total) by mouth 3 (three) times daily., Disp: 90 capsule, Rfl: 3   levocetirizine (XYZAL ) 5 MG tablet, Take 1 tablet (5 mg total) by mouth every evening., Disp: 30 tablet, Rfl: 1   losartan  (COZAAR ) 100 MG tablet, Take 1 tablet (100 mg total) by mouth daily., Disp: 90 tablet, Rfl: 3   meclizine  (ANTIVERT ) 25 MG tablet, Take 1 tablet (25 mg total) by mouth 3 (three) times daily as needed for dizziness., Disp: 30 tablet, Rfl: 0   spironolactone  (ALDACTONE ) 25 MG tablet, Take 1 tablet by mouth once daily, Disp: 90 tablet, Rfl: 3   traZODone (DESYREL) 100 MG tablet, TK 1 T PO HS PRN, Disp: , Rfl:    albuterol  (VENTOLIN  HFA) 108 (90 Base) MCG/ACT inhaler, INHALE 1 PUFF BY MOUTH EVERY 4 TO 6 HOURS AS NEEDED, Disp: 18 g, Rfl: 2   HYDROcodone -acetaminophen  (NORCO) 10-325 MG tablet, Take 1 tablet by mouth 2 (two) times daily as needed., Disp: 60 tablet, Rfl: 0   Review of Systems:   Negative unless otherwise specified per HPI.  Vitals:   Vitals:   02/09/24 1108  BP: 136/84  Pulse: 82  Temp: 98.2 F (36.8 C)  TempSrc: Temporal  SpO2: 96%  Weight: 207 lb (93.9 kg)  Height: 5' 2 (1.575 m)     Body mass index is 37.86 kg/m.  Physical Exam:   Physical Exam Vitals and nursing note reviewed.  Constitutional:      General: She is not in acute distress.    Appearance: She is well-developed. She is not ill-appearing or toxic-appearing.  Cardiovascular:     Rate and Rhythm: Normal rate and regular rhythm.     Pulses: Normal pulses.     Heart sounds: Normal heart sounds, S1 normal and S2 normal.  Pulmonary:     Effort: Pulmonary effort is normal.     Breath sounds: Normal breath sounds.  Skin:     General: Skin is warm and dry.  Neurological:     Mental Status: She is alert.     GCS: GCS eye subscore is 4. GCS verbal subscore is 5. GCS motor subscore is 6.  Psychiatric:        Speech: Speech normal.  Behavior: Behavior normal. Behavior is cooperative.     Assessment and Plan:   Assessment and Plan    Malignant melanoma of skin of arm, left (HCC)  Invasive malignant melanoma with lesion present for eight years. Scheduled sentinel lymph node biopsy to assess metastasis. Discussed concerns about procedure and potential post-surgery radiation therapy. - Proceed with sentinel lymph node biopsy on January 5th. - Discuss potential for radiation therapy post-surgery based on biopsy results.  Chronic pain syndrome  Chronic pain with lumbar radiculopathy and compression fracture. Pain exacerbated by standing and worsens at night. Gabapentin suggested for radicular symptoms. Advised against mixing hydrocodone  with tramadol . - Prescribed gabapentin 100 mg, start with one dose at night, then try during the day if tolerated. - Continue hydrocodone  twice daily as needed for pain. - Monitor pain levels and adjust gabapentin dosage as needed. - follow up in 3 months for further refills  Stage 3a chronic kidney disease (HCC) Advised to avoid NSAIDs due to potential kidney function impairment. - Continue using Tylenol  for pain management. - Avoid NSAIDs such as ibuprofen , Aleve, and naproxen.   Obesity, morbid (HCC)  Ongoing Difficult to exercise due to chronic pain Continue efforts at healthy lifestyle  Essential hypertension  Well controlled Continue amlodipine  5 mg daily, losartan  100 mg daily, spironolactone  25 mg daily  Moderate persistent asthma without complication  Overall controlled with QVAR  Refill as needed albuterol    Lucie Buttner, PA-C

## 2024-03-08 ENCOUNTER — Ambulatory Visit: Admitting: Physician Assistant

## 2024-03-11 ENCOUNTER — Telehealth: Payer: Self-pay

## 2024-03-11 ENCOUNTER — Other Ambulatory Visit: Payer: Self-pay | Admitting: Physician Assistant

## 2024-03-11 MED ORDER — HYDROCODONE-ACETAMINOPHEN 10-325 MG PO TABS: 1.0000 | ORAL_TABLET | Freq: Two times a day (BID) | ORAL | 0 refills | Status: DC | PRN

## 2024-03-11 NOTE — Telephone Encounter (Unsigned)
 Copied from CRM [id number]. Topic: Clinical - Medication Refill >> Mar 11, 2024  9:43 AM Tinnie C wrote: Medication: HYDROcodone -acetaminophen  (NORCO) 10-325 MG tablet  Has the patient contacted their pharmacy? No (Agent: If no, request that the patient contact the pharmacy for the refill. If patient does not wish to contact the pharmacy document the reason why and proceed with request.) (Agent: If yes, when and what did the pharmacy advise?)  This is the patient's preferred pharmacy:  [facility] [address] ([address]), [place] - [address] [place] ([place]) [place] [address] Phone: [phone] Fax: [phone]  Is this the correct pharmacy for this prescription? Yes If no, delete pharmacy and type the correct one.   Has the prescription been filled recently? Yes  Is the patient out of the medication? Yes   Has the patient been seen for an appointment in the last year OR does the patient have an upcoming appointment? Yes  Can we respond through MyChart? Please call [phone]  Agent: Please be advised that Rx refills may take up to 3 business days. We ask that you follow-up with your pharmacy.

## 2024-03-11 NOTE — Telephone Encounter (Signed)
 Please advise on refill. Please and thank you  Copied from CRM [id number]. Topic: Clinical - Prescription Issue >> Mar 11, 2024  9:45 AM Tinnie BROCKS wrote: Reason for CRM: Pt is concerned about refill request for hydrocodone  sent in today. 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 Is there another provider that could sign off on this rx? Pt requesting call at [phone]

## 2024-03-23 NOTE — Telephone Encounter (Signed)
 Called and notified patient that she can pick her medications up from her pharmacy. Patient verbalized understanding and agreed to do so.

## 2024-03-28 ENCOUNTER — Encounter: Payer: Self-pay | Admitting: Dermatology

## 2024-03-31 ENCOUNTER — Encounter: Admitting: Dermatology

## 2024-04-01 ENCOUNTER — Other Ambulatory Visit: Payer: Self-pay | Admitting: Physician Assistant

## 2024-04-01 ENCOUNTER — Telehealth: Payer: Self-pay | Admitting: *Deleted

## 2024-04-01 MED ORDER — HYDROCODONE-ACETAMINOPHEN 10-325 MG PO TABS: 1.0000 | ORAL_TABLET | Freq: Two times a day (BID) | ORAL | 0 refills | Status: AC | PRN

## 2024-04-01 NOTE — Telephone Encounter (Signed)
Spoke to pt told her [facility] sent her Rx to the pharmacy. Pt verbalized understanding.

## 2024-04-01 NOTE — Telephone Encounter (Signed)
Pt requesting refill for Hydrocodone 

## 2024-04-01 NOTE — Telephone Encounter (Signed)
 Copied from CRM [id number]. Topic: Clinical - Medication Refill >> Apr 01, 2024 12:42 PM Larissa S wrote: Medication: HYDROcodone -acetaminophen  (NORCO) 10-325 MG tablet  Has the patient contacted their pharmacy? Yes (Agent: If no, request that the patient contact the pharmacy for the refill. If patient does not wish to contact the pharmacy document the reason why and proceed with request.) (Agent: If yes, when and what did the pharmacy advise?)  This is the patient's preferred pharmacy:  [facility] [address] ([address]), [place] - [address] [place] ([place]) [place] [address] Phone: [phone] Fax: [phone]  Is this the correct pharmacy for this prescription? Yes If no, delete pharmacy and type the correct one.   Has the prescription been filled recently? No  Is the patient out of the medication? Yes  Has the patient been seen for an appointment in the last year OR does the patient have an upcoming appointment? Yes  Can we respond through MyChart? No  Agent: Please be advised that Rx refills may take up to 3 business days. We ask that you follow-up with your pharmacy.

## 2024-04-04 ENCOUNTER — Telehealth: Payer: Self-pay | Admitting: *Deleted

## 2024-04-04 NOTE — Telephone Encounter (Signed)
 Tried to contact pt no answer, unable to leave message.

## 2024-04-04 NOTE — Telephone Encounter (Unsigned)
 Copied from CRM #[id number]. Topic: Clinical - Prescription Issue >> Apr 04, 2024  9:56 AM [name] wrote: Reason for CRM: Patient called in stating HYDROcodone -acetaminophen  (NORCO) 10-325 MG tablet  dosage needs to be changed so it can be filled. Dosage needs to be up so they can fill early.   ----------------------------------------------------------------------- From previous Reason for Contact - Medication Question: Reason for CRM: Patient called in stating HYDROcodone -acetaminophen  (NORCO) 10-325 MG tablet  dosage needs to be changed so it can be filled. Dosage needs to be up so they can fill early.

## 2024-04-05 ENCOUNTER — Telehealth: Payer: Self-pay | Admitting: *Deleted

## 2024-04-05 NOTE — Telephone Encounter (Signed)
 Spoke to pt told her we can not change dose of Hydrocodone , if you want an increase Virginia Cabrera said you will need to see pain management. Pt verbalized understanding and said she is okay the pharmacy told her to call for an increase do it can be filled. Told her you should of been able to pick it up on the 23rd. She said insurance will not fill till 04/11/2024. Told her I guess they are making you wait 30 days from last fill. Told her she can pay out of pocket. She said the pharmacy would not let her do that. Told her okay you will have to wait till 2/2. Pt said she will be fine.

## 2024-04-05 NOTE — Telephone Encounter (Signed)
 Copied from CRM #[id number]. Topic: Clinical - Prescription Issue >> Apr 04, 2024  9:56 AM [name] wrote: Reason for CRM: Patient called in stating HYDROcodone -acetaminophen  (NORCO) 10-325 MG tablet  dosage needs to be changed so it can be filled. Dosage needs to be up so they can fill early.   ----------------------------------------------------------------------- From previous Reason for Contact - Medication Question: Reason for CRM: Patient called in stating HYDROcodone -acetaminophen  (NORCO) 10-325 MG tablet  dosage needs to be changed so it can be filled. Dosage needs to be up so they can fill early. >> Apr 05, 2024  1:35 PM [name] wrote: Patient returning call to [place] regarding medication, was originally taking twice a day 1 in the am and 1 in the pm. States that dosage needs to be changed to 1 three times a day or above the 1 twice a day and then they can fill it.  Tenia [phone]

## 2024-04-05 NOTE — Telephone Encounter (Signed)
 See other message

## 2024-04-14 ENCOUNTER — Encounter: Payer: Self-pay | Admitting: Dermatology

## 2024-04-18 ENCOUNTER — Encounter: Admitting: Dermatology

## 2024-05-09 ENCOUNTER — Ambulatory Visit: Admitting: Physician Assistant

## 2024-06-20 ENCOUNTER — Ambulatory Visit: Admitting: Physician Assistant

## 2024-09-15 ENCOUNTER — Ambulatory Visit
